# Patient Record
Sex: Female | Born: 1995 | Race: Black or African American | Hispanic: No | Marital: Single | Smoking: Never smoker
Health system: Southern US, Community
[De-identification: ages and names within clinical notes are randomized; demographics above are authoritative.]

## PROBLEM LIST (undated history)

## (undated) ENCOUNTER — Inpatient Hospital Stay (HOSPITAL_COMMUNITY): Payer: Self-pay

## (undated) ENCOUNTER — Emergency Department (HOSPITAL_COMMUNITY): Discharge status: Absent without leave | Payer: Self-pay

## (undated) DIAGNOSIS — K08409 Partial loss of teeth, unspecified cause, unspecified class: Secondary | ICD-10-CM

## (undated) DIAGNOSIS — Z86718 Personal history of other venous thrombosis and embolism: Secondary | ICD-10-CM

## (undated) DIAGNOSIS — A6009 Herpesviral infection of other urogenital tract: Secondary | ICD-10-CM

## (undated) HISTORY — PX: WISDOM TOOTH EXTRACTION: SHX21

## (undated) HISTORY — PX: NO PAST SURGERIES: SHX2092

---
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
## 2013-01-10 ENCOUNTER — Emergency Department (HOSPITAL_COMMUNITY)
Admission: EM | Admit: 2013-01-10 | Discharge: 2013-01-11 | Disposition: A | Discharge status: Home / Self Care | Payer: Medicaid Other | Attending: Emergency Medicine | Admitting: Emergency Medicine

## 2013-01-10 ENCOUNTER — Encounter (HOSPITAL_COMMUNITY): Payer: Self-pay | Admitting: Emergency Medicine

## 2013-01-10 DIAGNOSIS — R110 Nausea: Secondary | ICD-10-CM | POA: Insufficient documentation

## 2013-01-10 DIAGNOSIS — Z86718 Personal history of other venous thrombosis and embolism: Secondary | ICD-10-CM | POA: Insufficient documentation

## 2013-01-10 DIAGNOSIS — R51 Headache: Secondary | ICD-10-CM | POA: Insufficient documentation

## 2013-01-10 DIAGNOSIS — Z8719 Personal history of other diseases of the digestive system: Secondary | ICD-10-CM | POA: Insufficient documentation

## 2013-01-10 DIAGNOSIS — R519 Headache, unspecified: Secondary | ICD-10-CM

## 2013-01-10 MED ORDER — DEXAMETHASONE SODIUM PHOSPHATE 10 MG/ML IJ SOLN
10.0000 mg | Freq: Once | INTRAMUSCULAR | Status: AC
  Administered 2013-01-10: 10 mg via INTRAMUSCULAR

## 2013-01-10 MED ORDER — METOCLOPRAMIDE HCL 5 MG/ML IJ SOLN
10.0000 mg | Freq: Once | INTRAMUSCULAR | Status: AC
  Administered 2013-01-10: 10 mg via INTRAMUSCULAR
  Filled 2013-01-10: qty 2

## 2013-01-10 MED ORDER — DIPHENHYDRAMINE HCL 25 MG PO CAPS
25.0000 mg | ORAL_CAPSULE | Freq: Once | ORAL | Status: AC
  Administered 2013-01-10: 25 mg via ORAL
  Filled 2013-01-10: qty 1

## 2013-01-10 MED ORDER — DEXAMETHASONE SODIUM PHOSPHATE 10 MG/ML IJ SOLN
10.0000 mg | Freq: Once | INTRAMUSCULAR | Status: DC
  Filled 2013-01-10: qty 1
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
## 2013-01-10 NOTE — ED Notes (Signed)
Pt states she was recently treated for chlamydia and states that she is still having an abnormal discharge and abd cramping with some spotting  Pt requests to be checked for STDs while here

## 2013-01-10 NOTE — ED Notes (Signed)
Pt states for the past 2 weeks she has had a headache every day  Pt states she has taken OTC medications, tylenol and aleve without relief  PT states there are spots on her head that feel like they are "thumping" and pt states her ears are ringing

## 2013-01-11 NOTE — ED Provider Notes (Signed)
Medical screening examination/treatment/procedure(s) were performed by non-physician practitioner and as supervising physician I was immediately available for consultation/collaboration.  EKG Interpretation   None         Lyanne Co, MD 01/11/13 [phone]

## 2013-01-16 ENCOUNTER — Encounter: Payer: Self-pay | Admitting: Obstetrics & Gynecology

## 2013-01-16 ENCOUNTER — Ambulatory Visit (INDEPENDENT_AMBULATORY_CARE_PROVIDER_SITE_OTHER): Payer: Medicaid Other | Admitting: Obstetrics & Gynecology

## 2013-01-16 VITALS — BP 119/76 | HR 84 | Temp 97.5°F | Ht 67.0 in | Wt 145.0 lb

## 2013-01-16 DIAGNOSIS — Z0000 Encounter for general adult medical examination without abnormal findings: Secondary | ICD-10-CM

## 2013-01-16 NOTE — Progress Notes (Signed)
Subjective:    01/16/2013 4:50 pm PATIENT NOT SEEN BY PROVIDER TOO EARLY FOR TEST OF CURE. ADVISED TO RESCHEDULE FOR January 2015.   Amy Dougherty is a 17 y.o. female here for one month follow up exam.  Current complaints: pt would like to have STD panel performed, also wants a pap smear, state she has family hx of cervical cancer in great grandmother.  Personal health questionnaire reviewed: yes.   Gynecologic History Patient's last menstrual period was 12/29/2012. Contraception: none Last Pap: N/A Last mammogram: N/A

## 2013-02-05 ENCOUNTER — Emergency Department (HOSPITAL_COMMUNITY)
Admission: EM | Admit: 2013-02-05 | Discharge: 2013-02-05 | Disposition: A | Discharge status: Home / Self Care | Payer: Medicaid Other | Attending: Emergency Medicine | Admitting: Emergency Medicine

## 2013-02-05 ENCOUNTER — Emergency Department (HOSPITAL_COMMUNITY): Payer: Medicaid Other

## 2013-02-05 ENCOUNTER — Encounter (HOSPITAL_COMMUNITY): Payer: Self-pay | Admitting: Emergency Medicine

## 2013-02-05 DIAGNOSIS — Y9389 Activity, other specified: Secondary | ICD-10-CM | POA: Insufficient documentation

## 2013-02-05 DIAGNOSIS — X500XXA Overexertion from strenuous movement or load, initial encounter: Secondary | ICD-10-CM | POA: Insufficient documentation

## 2013-02-05 DIAGNOSIS — M62838 Other muscle spasm: Secondary | ICD-10-CM

## 2013-02-05 DIAGNOSIS — Z23 Encounter for immunization: Secondary | ICD-10-CM | POA: Insufficient documentation

## 2013-02-05 DIAGNOSIS — Y92009 Unspecified place in unspecified non-institutional (private) residence as the place of occurrence of the external cause: Secondary | ICD-10-CM | POA: Insufficient documentation

## 2013-02-05 DIAGNOSIS — Z8719 Personal history of other diseases of the digestive system: Secondary | ICD-10-CM | POA: Insufficient documentation

## 2013-02-05 DIAGNOSIS — R42 Dizziness and giddiness: Secondary | ICD-10-CM | POA: Insufficient documentation

## 2013-02-05 DIAGNOSIS — S46909A Unspecified injury of unspecified muscle, fascia and tendon at shoulder and upper arm level, unspecified arm, initial encounter: Secondary | ICD-10-CM | POA: Insufficient documentation

## 2013-02-05 DIAGNOSIS — S4980XA Other specified injuries of shoulder and upper arm, unspecified arm, initial encounter: Secondary | ICD-10-CM | POA: Insufficient documentation

## 2013-02-05 DIAGNOSIS — R55 Syncope and collapse: Secondary | ICD-10-CM

## 2013-02-05 DIAGNOSIS — S0993XA Unspecified injury of face, initial encounter: Secondary | ICD-10-CM | POA: Insufficient documentation

## 2013-02-05 DIAGNOSIS — Z86718 Personal history of other venous thrombosis and embolism: Secondary | ICD-10-CM | POA: Insufficient documentation

## 2013-02-05 DIAGNOSIS — R0602 Shortness of breath: Secondary | ICD-10-CM

## 2013-02-05 DIAGNOSIS — R296 Repeated falls: Secondary | ICD-10-CM | POA: Insufficient documentation

## 2013-02-05 LAB — POCT I-STAT, CHEM 8
Chloride: 104 mEq/L (ref 96–112)
Creatinine, Ser: 0.7 mg/dL (ref 0.50–1.10)
HCT: 35 % — ABNORMAL LOW (ref 36.0–46.0)
Hemoglobin: 11.9 g/dL — ABNORMAL LOW (ref 12.0–15.0)
Potassium: 3.5 mEq/L — ABNORMAL LOW (ref 3.7–5.3)
Sodium: 141 mEq/L (ref 137–147)
TCO2: 25 mmol/L (ref 0–100)

## 2013-02-05 MED ORDER — IOHEXOL 350 MG/ML SOLN
100.0000 mL | Freq: Once | INTRAVENOUS | Status: AC | PRN
  Administered 2013-02-05: 100 mL via INTRAVENOUS

## 2013-02-05 MED ORDER — TETANUS-DIPHTH-ACELL PERTUSSIS 5-2.5-18.5 LF-MCG/0.5 IM SUSP
0.5000 mL | Freq: Once | INTRAMUSCULAR | Status: AC
  Administered 2013-02-05: 0.5 mL via INTRAMUSCULAR
  Filled 2013-02-05 (×2): qty 0.5

## 2013-02-05 MED ORDER — CYCLOBENZAPRINE HCL 10 MG PO TABS: 10.0000 mg | ORAL_TABLET | Freq: Three times a day (TID) | ORAL | Status: DC | PRN

## 2013-02-05 MED ORDER — IOHEXOL 300 MG/ML  SOLN: 100.0000 mL | Freq: Once | INTRAMUSCULAR | Status: DC | PRN

## 2013-02-05 NOTE — ED Notes (Signed)
Pt states she was on the phone and felt a pain in her left shoulder, then her neck became stiff and she felt like it was hard to breathe, as she was walking through the kitchen she started to fall and injured her left knee

## 2013-02-05 NOTE — Discharge Instructions (Signed)
Your lab tests and CAT scan of your chest have not shown any signs for concerning or emergent cause of your symptoms. There were no signs of blood clots in her lungs. At this time your providers feel that you may return home. Use warm compresses over your shoulder and neck area to help with pain and tightness. Followup with a primary care provider for continued evaluation and treatment.    Muscle Cramps and Spasms Muscle cramps and spasms occur when a muscle or muscles tighten and you have no control over this tightening (involuntary muscle contraction). They are a common problem and can develop in any muscle. The most common place is in the calf muscles of the leg. Both muscle cramps and muscle spasms are involuntary muscle contractions, but they also have differences:   Muscle cramps are sporadic and painful. They may last a few seconds to a quarter of an hour. Muscle cramps are often more forceful and last longer than muscle spasms.  Muscle spasms may or may not be painful. They may also last just a few seconds or much longer. CAUSES  It is uncommon for cramps or spasms to be due to a serious underlying problem. In many cases, the cause of cramps or spasms is unknown. Some common causes are:   Overexertion.   Overuse from repetitive motions (doing the same thing over and over).   Remaining in a certain position for a long period of time.   Improper preparation, form, or technique while performing a sport or activity.   Dehydration.   Injury.   Side effects of some medicines.   Abnormally low levels of the salts and ions in your blood (electrolytes), especially potassium and calcium. This could happen if you are taking water pills (diuretics) or you are pregnant.  Some underlying medical problems can make it more likely to develop cramps or spasms. These include, but are not limited to:   Diabetes.   Parkinson disease.   Hormone disorders, such as thyroid problems.    Alcohol abuse.   Diseases specific to muscles, joints, and bones.   Blood vessel disease where not enough blood is getting to the muscles.  HOME CARE INSTRUCTIONS   Stay well hydrated. Drink enough water and fluids to keep your urine clear or pale yellow.  It may be helpful to massage, stretch, and relax the affected muscle.  For tight or tense muscles, use a warm towel, heating pad, or hot shower water directed to the affected area.  If you are sore or have pain after a cramp or spasm, applying ice to the affected area may relieve discomfort.  Put ice in a plastic bag.  Place a towel between your skin and the bag.  Leave the ice on for 15-20 minutes, 03-04 times a day.  Medicines used to treat a known cause of cramps or spasms may help reduce their frequency or severity. Only take over-the-counter or prescription medicines as directed by your caregiver. SEEK MEDICAL CARE IF:  Your cramps or spasms get more severe, more frequent, or do not improve over time.  MAKE SURE YOU:   Understand these instructions.  Will watch your condition.  Will get help right away if you are not doing well or get worse. Document Released: 07/15/2001 Document Revised: 05/20/2012 Document Reviewed: 01/10/2012 [facility] Patient Information 2014 [place], [place].   Syncope Syncope is a fainting spell. This means the person loses consciousness and drops to the ground. The person is generally unconscious for less than  5 minutes. The person may have some muscle twitches for up to 15 seconds before waking up and returning to normal. Syncope occurs more often in elderly people, but it can happen to anyone. While most causes of syncope are not dangerous, syncope can be a sign of a serious medical problem. It is important to seek medical care.  CAUSES  Syncope is caused by a sudden decrease in blood flow to the brain. The specific cause is often not determined. Factors that can trigger syncope  include:  Taking medicines that lower blood pressure.  Sudden changes in posture, such as standing up suddenly.  Taking more medicine than prescribed.  Standing in one place for too long.  Seizure disorders.  Dehydration and excessive exposure to heat.  Low blood sugar (hypoglycemia).  Straining to have a bowel movement.  Heart disease, irregular heartbeat, or other circulatory problems.  Fear, emotional distress, seeing blood, or severe pain. SYMPTOMS  Right before fainting, you may:  Feel dizzy or lightheaded.  Feel nauseous.  See all white or all black in your field of vision.  Have cold, clammy skin. DIAGNOSIS  Your caregiver will ask about your symptoms, perform a physical exam, and perform electrocardiography (ECG) to record the electrical activity of your heart. Your caregiver may also perform other heart or blood tests to determine the cause of your syncope. TREATMENT  In most cases, no treatment is needed. Depending on the cause of your syncope, your caregiver may recommend changing or stopping some of your medicines. HOME CARE INSTRUCTIONS  Have someone stay with you until you feel stable.  Do not drive, operate machinery, or play sports until your caregiver says it is okay.  Keep all follow-up appointments as directed by your caregiver.  Lie down right away if you start feeling like you might faint. Breathe deeply and steadily. Wait until all the symptoms have passed.  Drink enough fluids to keep your urine clear or pale yellow.  If you are taking blood pressure or heart medicine, get up slowly, taking several minutes to sit and then stand. This can reduce dizziness. SEEK IMMEDIATE MEDICAL CARE IF:   You have a severe headache.  You have unusual pain in the chest, abdomen, or back.  You are bleeding from the mouth or rectum, or you have black or tarry stool.  You have an irregular or very fast heartbeat.  You have pain with breathing.  You have  repeated fainting or seizure-like jerking during an episode.  You faint when sitting or lying down.  You have confusion.  You have difficulty walking.  You have severe weakness.  You have vision problems. If you fainted, call your local emergency services (911 in [place] Do not drive yourself to the hospital.  MAKE SURE YOU:  Understand these instructions.  Will watch your condition.  Will get help right away if you are not doing well or get worse. Document Released: 01/23/2005 Document Revised: 07/25/2011 Document Reviewed: 03/24/2011 [facility] Patient Information 2014 [place], [place].

## 2013-02-05 NOTE — ED Provider Notes (Signed)
CSN: [id number]     Arrival date & time 02/05/13  0137 History   First MD Initiated Contact with Patient 02/05/13 0325     Chief Complaint  Patient presents with  . Shoulder Pain  . Near Syncope   HPI  History provided by the patient. 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 Patient states that she was talking on the telephone was calm and relaxed when she suddenly felt a sharp pain in her left shoulder and neck area. She states that she felt there may have been a lump in the area but could not see anything in the mirror. She went to try to lie down but reports feeling more comfortable laying flat. She went to stand back up and states that she felt a tightness in her neck and throat area with shortness of breath. This was followed by a brief episodes of syncope causing her to fall and scrape her left knee. She did have a small abrasion with some bleeding that was controlled. Currently she denies any shortness of breath. Denies any chest pain. No cough or hemoptysis. She still continues to have some pain and discomfort in her left shoulder and neck area. Denies any neck stiffness or reduced range of motion. Denies any recent illnesses, fever, chills or sweats. No recent long travel. No swelling or pain in the extremities. Patient did recently gave birth 3 months ago. She does not use any estrogen or birth control currently. No other aggravating or alleviating factors. No other associated symptoms.    Past Medical History  Diagnosis Date  . History of blood clots     Blood clot in  Right overy after delivery  . Wisdom teeth extracted    Past Surgical History  Procedure Laterality Date  . Wisdom tooth extraction     Family History  Problem Relation Age of Onset  . Asthma Brother    History  Substance Use Topics  . Smoking  status: Never Smoker   . Smokeless tobacco: Not on file  . Alcohol Use: Yes     Comment: rarely   OB History   Grav Para Term Preterm Abortions TAB SAB Ect Mult Living   2 2 2  0 0 0 0 0 0 2     Review of Systems  Respiratory: Positive for shortness of breath. Negative for cough.   Cardiovascular: Negative for chest pain.  Musculoskeletal: Positive for neck pain. Negative for neck stiffness.  Neurological: Positive for syncope and light-headedness. Negative for weakness, numbness and headaches.  All other systems reviewed and are negative.    Allergies  Apple; Ibuprofen; Norethindrone; and Other  Home Medications   Current Outpatient Rx  Name  Route  Sig  Dispense  Refill  . acetaminophen (TYLENOL) 500 MG tablet   Oral   Take 500 mg by mouth every 6 (six) hours as needed for moderate pain or headache.         . naproxen sodium (ANAPROX) 220 MG tablet   Oral   Take 220 mg by mouth 2 (two) times daily as needed (headache).          BP 129/69  Pulse 75  Temp(Src) 97.9 F (36.6 C) (Oral)  Resp 18  SpO2 99%  LMP 01/29/2013 Physical Exam  Nursing note and vitals reviewed. Constitutional: She is oriented to  person, place, and time. She appears well-developed and well-nourished. No distress.  HENT:  Head: Normocephalic.  Mouth/Throat: Oropharynx is clear and moist.  Neck: Normal range of motion. Neck supple. No tracheal deviation present. No thyromegaly present.  No cervical midline tenderness. Normal swallow.  Cardiovascular: Normal rate and regular rhythm.   Pulmonary/Chest: Effort normal and breath sounds normal. No stridor. No respiratory distress. She has no wheezes. She has no rales.  Abdominal: Soft.  Musculoskeletal: Normal range of motion.  There is some pain around the left trapezius area with slight tightness and spasm of the muscle.  Neurological: She is alert and oriented to person, place, and time. She has normal strength. No sensory deficit.  Skin:  Skin is warm and dry. No rash noted.  Psychiatric: She has a normal mood and affect. Her behavior is normal.    ED Course  Procedures   DIAGNOSTIC STUDIES: Oxygen Saturation is 99% on room air.    COORDINATION OF CARE:  Nursing notes reviewed. Vital signs reviewed. Initial pt interview and examination performed.   4:40 AM-patient seen and evaluated. She appears well no acute distress. Normal respirations O2 sats. Denies shortness of breath this time. 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 She has been on blood thinners in the past but not currently. Recent pregnancy and delivery 3 months ago. Patient with unusual combination of pain in left shoulder and neck area with tightness, difficulty breathing and syncopal episode. Exam without any real significant findings. Discussed work up plan with pt at bedside, which includes basic lab tests and CT scan of chest to rule out PE. Pt agrees with plan.   Labs and CT scan unremarkable. At this time do not suspect any concerning or emergent cause of her symptoms. Patient has not had any return of symptoms with stable vital signs. She was given strict return precautions. She will be discharged at this time.   Treatment plan initiated: Medications  Tdap (BOOSTRIX) injection 0.5 mL (not administered)   Results for orders placed during the hospital encounter of 02/05/13  POCT I-STAT, CHEM 8      Result Value Range   Sodium 141  137 - 147 mEq/L   Potassium 3.5 (*) 3.7 - 5.3 mEq/L   Chloride 104  96 - 112 mEq/L   BUN 15  6 - 23 mg/dL   Creatinine, Ser 1.61  0.50 - 1.10 mg/dL   Glucose, Bld 78  70 - 99 mg/dL   Calcium, Ion 0.96  0.45 - 1.23 mmol/L   TCO2 25  0 - 100 mmol/L   Hemoglobin 11.9 (*) 12.0 - 15.0 g/dL   HCT 40.9 (*) 81.1 - 91.4 %     Imaging Review Ct Angio Chest W/cm &/or Wo Cm  02/05/2013   CLINICAL DATA:  Left shoulder pain and difficulty breathing. History of blood clot on the right ovary. Near syncope.  EXAM: CT  ANGIOGRAPHY CHEST WITH CONTRAST  TECHNIQUE: Multidetector CT imaging of the chest was performed using the standard protocol during bolus administration of intravenous contrast. Multiplanar CT image reconstructions including MIPs were obtained to evaluate the vascular anatomy.  CONTRAST:  OMNIPAQUE IOHEXOL 350 MG/ML SOLN  COMPARISON:  03/16/2010  FINDINGS: Technically adequate study with good opacification of the central and segmental pulmonary arteries. No focal filling defects demonstrated. No evidence of significant pulmonary embolus.  Normal heart size. Normal caliber thoracic aorta without dissection. Increased soft tissue in the anterior mediastinum consistent with thymic tissue. Esophagus is decompressed. No  significant lymphadenopathy in the chest. No pleural effusion. Visualized portions of the upper abdominal organs are grossly unremarkable. The lungs are clear. No significant change since previous study.  Review of the MIP images confirms the above findings.  IMPRESSION: No evidence of significant pulmonary embolus.   Electronically Signed   By: Burman Nieves M.D.   On: 02/05/2013 05:29      MDM   1. Muscle spasm   2. Shortness of breath   3. Syncope        Angus Seller, PA-C 02/05/13 2055

## 2013-02-06 NOTE — ED Provider Notes (Signed)
Medical screening examination/treatment/procedure(s) were performed by non-physician practitioner and as supervising physician I was immediately available for consultation/collaboration.    Elion Hocker, MD 02/06/13 1001 

## 2013-02-10 ENCOUNTER — Emergency Department (INDEPENDENT_AMBULATORY_CARE_PROVIDER_SITE_OTHER)
Admission: EM | Admit: 2013-02-10 | Discharge: 2013-02-10 | Disposition: A | Discharge status: Home / Self Care | Payer: Medicaid Other | Source: Home / Self Care

## 2013-02-10 ENCOUNTER — Encounter (HOSPITAL_COMMUNITY): Payer: Self-pay | Admitting: Emergency Medicine

## 2013-02-10 DIAGNOSIS — I889 Nonspecific lymphadenitis, unspecified: Secondary | ICD-10-CM

## 2013-02-10 MED ORDER — DOXYCYCLINE HYCLATE 100 MG PO CAPS: 100.0000 mg | ORAL_CAPSULE | Freq: Two times a day (BID) | ORAL | Status: DC

## 2013-02-10 NOTE — ED Notes (Signed)
C/o mass on left side of neck  States the mass is getting bigger The mass is only painful when pressure is put on it Denies any drainage or itching.

## 2013-02-10 NOTE — Discharge Instructions (Signed)
This swollen spot on your neck appears to be a lymph node. We will treat you for an infection, please followup with Dr. Redmond Baseman, otolaryngologist, if your symptoms do not resolve within one week. Place warm compresses over the spot on your neck as needed to help the symptoms.  Swollen Lymph Nodes The lymphatic system filters fluid from around cells. It is like a system of blood vessels. These channels carry lymph instead of blood. The lymphatic system is an important part of the immune (disease fighting) system. When people talk about "swollen glands in the neck," they are usually talking about swollen lymph nodes. The lymph nodes are like the little traps for infection. You and your caregiver may be able to feel lymph nodes, especially swollen nodes, in these common areas: the groin (inguinal area), armpits (axilla), and above the clavicle (supraclavicular). You may also feel them in the neck (cervical) and the back of the head just above the hairline (occipital). Swollen glands occur when there is any condition in which the body responds with an allergic type of reaction. For instance, the glands in the neck can become swollen from insect bites or any type of minor infection on the head. These are very noticeable in children with only minor problems. Lymph nodes may also become swollen when there is a tumor or problem with the lymphatic system, such as Hodgkin's disease. TREATMENT   Most swollen glands do not require treatment. They can be observed (watched) for a short period of time, if your caregiver feels it is necessary. Most of the time, observation is not necessary.  Antibiotics (medicines that kill germs) may be prescribed by your caregiver. Your caregiver may prescribe these if he or she feels the swollen glands are due to a bacterial (germ) infection. Antibiotics are not used if the swollen glands are caused by a virus. HOME CARE INSTRUCTIONS   Take medications as directed by your caregiver.  Only take over-the-counter or prescription medicines for pain, discomfort, or fever as directed by your caregiver. SEEK MEDICAL CARE IF:   If you begin to run a temperature greater than 102 F (38.9 C), or as your caregiver suggests. MAKE SURE YOU:   Understand these instructions.  Will watch your condition.  Will get help right away if you are not doing well or get worse. Document Released: 01/13/2002 Document Revised: 04/17/2011 Document Reviewed: 01/23/2005 [facility] Patient Information 2014 [place].

## 2013-02-10 NOTE — ED Provider Notes (Signed)
CSN: [id number]     Arrival date & time 02/10/13  1306 History   None    Chief Complaint  Patient presents with  . Mass   (Consider location/radiation/quality/duration/timing/severity/associated sxs/prior Treatment) HPI Comments: Swollen spot on back order 5 days, getting bigger and more painful. She was told it was a muscle spasm. No other symptoms.  18 year old female with a history of DVT presents complaining of a mass on the left side of her neck. This is been present since Thursday, about 5 days ago. The area is continually more swollen and painful to touch. She went to the emergency department for this today began and she was told it was a muscle spasm. She was treated with Flexeril, this has not been helping at all. She denies fever, chills, NVD, ear pain, sore throat, headache, lightheadedness.   Past Medical History  Diagnosis Date  . History of blood clots     Blood clot in  Right overy after delivery  . Wisdom teeth extracted    Past Surgical History  Procedure Laterality Date  . Wisdom tooth extraction     Family History  Problem Relation Age of Onset  . Asthma Brother    History  Substance Use Topics  . Smoking status: Never Smoker   . Smokeless tobacco: Not on file  . Alcohol Use: Yes     Comment: rarely   OB History   Grav Para Term Preterm Abortions TAB SAB Ect Mult Living   2 2 2  0 0 0 0 0 0 2     Review of Systems  Constitutional: Negative for fever and chills.  Eyes: Negative for visual disturbance.  Respiratory: Negative for cough and shortness of breath.   Cardiovascular: Negative for chest pain, palpitations and leg swelling.  Gastrointestinal: Negative for nausea, vomiting and abdominal pain.  Endocrine: Negative for polydipsia and polyuria.  Genitourinary: Negative for dysuria, urgency and frequency.  Musculoskeletal: Negative for arthralgias and myalgias.  Skin:       See HPI  Neurological: Negative for dizziness, weakness and  light-headedness.    Allergies  Apple; Ibuprofen; Norethindrone; and Other  Home Medications   Current Outpatient Rx  Name  Route  Sig  Dispense  Refill  . acetaminophen (TYLENOL) 500 MG tablet   Oral   Take 500 mg by mouth every 6 (six) hours as needed for moderate pain or headache.         . cyclobenzaprine (FLEXERIL) 10 MG tablet   Oral   Take 1 tablet (10 mg total) by mouth 3 (three) times daily as needed for muscle spasms.   30 tablet   0   . doxycycline (VIBRAMYCIN) 100 MG capsule   Oral   Take 1 capsule (100 mg total) by mouth 2 (two) times daily.   20 capsule   0   . naproxen sodium (ANAPROX) 220 MG tablet   Oral   Take 220 mg by mouth 2 (two) times daily as needed (headache).          BP 128/67  Pulse 83  Temp(Src) 98.3 F (36.8 C) (Oral)  Resp 16  SpO2 100%  LMP 01/29/2013 Physical Exam  Nursing note and vitals reviewed. Constitutional: She is oriented to person, place, and time. Vital signs are normal. She appears well-developed and well-nourished. No distress.  HENT:  Head: Normocephalic and atraumatic.  Neck:    Urine demarcates a firm, tender knot in the neck over top of the superior trapezius, somewhat mobile  Pulmonary/Chest: Effort normal. No respiratory distress.  Neurological: She is alert and oriented to person, place, and time. She has normal strength. Coordination normal.  Skin: Skin is warm and dry. No rash noted. She is not diaphoretic.  Psychiatric: She has a normal mood and affect. Judgment normal.    ED Course  Procedures (including critical care time) Labs Review Labs Reviewed - No data to display Imaging Review No results found.    MDM   1. Lymphadenitis     Patient seen with Dr. Jake Michaelis, this is probably lymphadenitis. Treat with doxycycline followup with ENT for excisional biopsy if not improving.  Pt agrees with plan.  ED if worsening    Meds ordered this encounter  Medications  . doxycycline (VIBRAMYCIN) 100  MG capsule    Sig: Take 1 capsule (100 mg total) by mouth 2 (two) times daily.    Dispense:  20 capsule    Refill:  0    Order Specific Question:  Supervising Provider    Answer:  Jake Michaelis, DAVID C [[id number]]     Liam Graham, PA-C 02/11/13 1708

## 2013-02-12 NOTE — ED Provider Notes (Signed)
Medical screening examination/treatment/procedure(s) were performed by non-physician practitioner and as supervising physician I was immediately available for consultation/collaboration.  Philipp Deputy, M.D.  Harden Mo, MD 02/12/13 [id number]

## 2013-02-17 ENCOUNTER — Encounter: Payer: Self-pay | Admitting: Home Health Services

## 2013-02-19 ENCOUNTER — Encounter: Payer: Self-pay | Admitting: Obstetrics

## 2013-02-19 ENCOUNTER — Ambulatory Visit (INDEPENDENT_AMBULATORY_CARE_PROVIDER_SITE_OTHER): Payer: Medicaid Other | Admitting: Obstetrics

## 2013-02-19 VITALS — BP 128/82 | HR 77 | Temp 97.1°F | Ht 66.0 in | Wt 152.0 lb

## 2013-02-19 DIAGNOSIS — Z8619 Personal history of other infectious and parasitic diseases: Secondary | ICD-10-CM

## 2013-02-19 DIAGNOSIS — N72 Inflammatory disease of cervix uteri: Secondary | ICD-10-CM

## 2013-02-19 DIAGNOSIS — Z113 Encounter for screening for infections with a predominantly sexual mode of transmission: Secondary | ICD-10-CM

## 2013-02-19 NOTE — Progress Notes (Signed)
Subjective:    Amy Dougherty is a 18 y.o. female who presents for sexually transmitted disease check. Sexual history reviewed with the patient. STI Exposure: denies knowledge of risky exposure. Previous history of STI chlamydia. Current symptoms pelvic pain: mild. Patient in office today for test of cure. Patienthad positive chlamydia cultures 12-12-12. Patient completed treatment an denies any re-exposure. Contraception: condoms Menstrual History: OB History   Grav Para Term Preterm Abortions TAB SAB Ect Mult Living   2 2 2  0 0 0 0 0 0 2      Menarche age: 49  Patient's last menstrual period was 01/29/2013.    The following portions of the patient's history were reviewed and updated as appropriate: allergies, current medications, past family history, past medical history, past social history, past surgical history and problem list.  Review of Systems Pertinent items are noted in HPI.    Objective:    BP 128/82  Pulse 77  Temp(Src) 97.1 F (36.2 C)  Ht 5\' 6"  (1.676 m)  Wt 152 lb (68.947 kg)  BMI 24.55 kg/m2  LMP 01/29/2013  Breastfeeding? No General:   alert and no distress  Lymph Nodes:   Cervical, supraclavicular, and axillary nodes normal.  Pelvis:  Vulva and vagina appear normal. Bimanual exam reveals normal uterus and adnexa.  Cultures:  GC and Chlamydia genprobes and Affirm     Assessment:    Possible STD exposure   F/U from previous STD exposure.   Plan:    Discussed safe sexual practice in detail See orders for STD cultures and assays Will call pt with results

## 2013-02-20 ENCOUNTER — Encounter: Payer: Self-pay | Admitting: Obstetrics

## 2013-02-20 LAB — HIV ANTIBODY (ROUTINE TESTING W REFLEX): HIV: NONREACTIVE

## 2013-02-20 LAB — WET PREP BY MOLECULAR PROBE
Candida species: NEGATIVE
Gardnerella vaginalis: NEGATIVE
Trichomonas vaginosis: NEGATIVE

## 2013-02-20 LAB — GC/CHLAMYDIA PROBE AMP
CT Probe RNA: NEGATIVE
GC PROBE AMP APTIMA: NEGATIVE

## 2013-02-20 LAB — HEPATITIS B SURFACE ANTIGEN: Hepatitis B Surface Ag: NEGATIVE

## 2013-02-20 LAB — RPR

## 2013-02-20 LAB — HEPATITIS C ANTIBODY: HCV Ab: NEGATIVE

## 2013-04-28 ENCOUNTER — Other Ambulatory Visit: Payer: Self-pay | Admitting: Dermatology

## 2013-04-29 ENCOUNTER — Other Ambulatory Visit: Payer: Self-pay | Admitting: *Deleted

## 2013-05-01 ENCOUNTER — Other Ambulatory Visit: Payer: Self-pay | Admitting: *Deleted

## 2013-05-01 DIAGNOSIS — Z309 Encounter for contraceptive management, unspecified: Secondary | ICD-10-CM

## 2013-05-01 MED ORDER — NORETHINDRONE 0.35 MG PO TABS: 1.0000 | ORAL_TABLET | Freq: Every day | ORAL | Status: DC

## 2013-05-01 MED ORDER — NORETHINDRONE 0.35 MG PO TABS
1.0000 | ORAL_TABLET | Freq: Every day | ORAL | Status: DC
Start: 2013-05-01 — End: 2013-05-01

## 2013-05-02 ENCOUNTER — Encounter: Payer: Self-pay | Admitting: Obstetrics & Gynecology

## 2013-05-08 ENCOUNTER — Other Ambulatory Visit: Payer: Self-pay | Admitting: *Deleted

## 2013-05-08 DIAGNOSIS — B9689 Other specified bacterial agents as the cause of diseases classified elsewhere: Secondary | ICD-10-CM

## 2013-05-08 DIAGNOSIS — N760 Acute vaginitis: Principal | ICD-10-CM

## 2013-05-08 MED ORDER — METRONIDAZOLE 500 MG PO TABS: 500.0000 mg | ORAL_TABLET | Freq: Two times a day (BID) | ORAL | Status: DC

## 2013-05-16 ENCOUNTER — Encounter (HOSPITAL_COMMUNITY): Payer: Self-pay | Admitting: *Deleted

## 2013-05-16 ENCOUNTER — Inpatient Hospital Stay (HOSPITAL_COMMUNITY)
Admission: AD | Admit: 2013-05-16 | Discharge: 2013-05-23 | DRG: 758 | Disposition: A | Discharge status: Home / Self Care | Payer: Medicaid Other | Source: Ambulatory Visit | Attending: Obstetrics | Admitting: Obstetrics

## 2013-05-16 DIAGNOSIS — A549 Gonococcal infection, unspecified: Secondary | ICD-10-CM | POA: Diagnosis present

## 2013-05-16 DIAGNOSIS — A6000 Herpesviral infection of urogenital system, unspecified: Secondary | ICD-10-CM

## 2013-05-16 DIAGNOSIS — D234 Other benign neoplasm of skin of scalp and neck: Secondary | ICD-10-CM | POA: Diagnosis present

## 2013-05-16 DIAGNOSIS — A5609 Other chlamydial infection of lower genitourinary tract: Secondary | ICD-10-CM

## 2013-05-16 DIAGNOSIS — A5602 Chlamydial vulvovaginitis: Secondary | ICD-10-CM | POA: Diagnosis present

## 2013-05-16 DIAGNOSIS — B3749 Other urogenital candidiasis: Secondary | ICD-10-CM

## 2013-05-16 DIAGNOSIS — A5619 Other chlamydial genitourinary infection: Secondary | ICD-10-CM | POA: Diagnosis present

## 2013-05-16 DIAGNOSIS — A5400 Gonococcal infection of lower genitourinary tract, unspecified: Secondary | ICD-10-CM | POA: Diagnosis present

## 2013-05-16 DIAGNOSIS — D649 Anemia, unspecified: Secondary | ICD-10-CM | POA: Diagnosis present

## 2013-05-16 DIAGNOSIS — R609 Edema, unspecified: Secondary | ICD-10-CM | POA: Diagnosis present

## 2013-05-16 DIAGNOSIS — N739 Female pelvic inflammatory disease, unspecified: Secondary | ICD-10-CM | POA: Diagnosis present

## 2013-05-16 DIAGNOSIS — Z872 Personal history of diseases of the skin and subcutaneous tissue: Secondary | ICD-10-CM

## 2013-05-16 DIAGNOSIS — B009 Herpesviral infection, unspecified: Secondary | ICD-10-CM | POA: Diagnosis present

## 2013-05-16 DIAGNOSIS — B081 Molluscum contagiosum: Secondary | ICD-10-CM | POA: Diagnosis present

## 2013-05-16 DIAGNOSIS — B002 Herpesviral gingivostomatitis and pharyngotonsillitis: Secondary | ICD-10-CM

## 2013-05-16 LAB — URINALYSIS, ROUTINE W REFLEX MICROSCOPIC
BILIRUBIN URINE: NEGATIVE
Glucose, UA: NEGATIVE mg/dL
Hgb urine dipstick: NEGATIVE
KETONES UR: NEGATIVE mg/dL
Leukocytes, UA: NEGATIVE
Nitrite: NEGATIVE
PH: 6 (ref 5.0–8.0)
Protein, ur: NEGATIVE mg/dL
Specific Gravity, Urine: 1.02 (ref 1.005–1.030)
UROBILINOGEN UA: 0.2 mg/dL (ref 0.0–1.0)

## 2013-05-16 LAB — CBC WITH DIFFERENTIAL/PLATELET
BASOS PCT: 0 % (ref 0–1)
Basophils Absolute: 0 10*3/uL (ref 0.0–0.1)
EOS ABS: 0.3 10*3/uL (ref 0.0–0.7)
Eosinophils Relative: 4 % (ref 0–5)
HCT: 33.3 % — ABNORMAL LOW (ref 36.0–46.0)
HEMOGLOBIN: 10.7 g/dL — AB (ref 12.0–15.0)
Lymphocytes Relative: 22 % (ref 12–46)
Lymphs Abs: 1.7 10*3/uL (ref 0.7–4.0)
MCH: 26.1 pg (ref 26.0–34.0)
MCHC: 32.1 g/dL (ref 30.0–36.0)
MCV: 81.2 fL (ref 78.0–100.0)
MONOS PCT: 10 % (ref 3–12)
Monocytes Absolute: 0.8 10*3/uL (ref 0.1–1.0)
Neutro Abs: 5 10*3/uL (ref 1.7–7.7)
Neutrophils Relative %: 64 % (ref 43–77)
Platelets: 247 10*3/uL (ref 150–400)
RBC: 4.1 MIL/uL (ref 3.87–5.11)
RDW: 13.7 % (ref 11.5–15.5)
WBC: 7.8 10*3/uL (ref 4.0–10.5)

## 2013-05-16 LAB — BASIC METABOLIC PANEL
BUN: 10 mg/dL (ref 6–23)
CALCIUM: 8.9 mg/dL (ref 8.4–10.5)
CO2: 26 mEq/L (ref 19–32)
CREATININE: 0.51 mg/dL (ref 0.50–1.10)
Chloride: 100 mEq/L (ref 96–112)
Glucose, Bld: 85 mg/dL (ref 70–99)
Potassium: 3.9 mEq/L (ref 3.7–5.3)
Sodium: 138 mEq/L (ref 137–147)

## 2013-05-16 LAB — WET PREP, GENITAL
Clue Cells Wet Prep HPF POC: NONE SEEN
Trich, Wet Prep: NONE SEEN
Yeast Wet Prep HPF POC: NONE SEEN

## 2013-05-16 MED ORDER — MAGIC MOUTHWASH
5.0000 mL | Freq: Four times a day (QID) | ORAL | Status: DC | PRN
  Administered 2013-05-16 – 2013-05-17 (×2): 5 mL via ORAL
  Filled 2013-05-16 (×3): qty 5

## 2013-05-16 MED ORDER — DOCUSATE SODIUM 100 MG PO CAPS
100.0000 mg | ORAL_CAPSULE | Freq: Two times a day (BID) | ORAL | Status: DC
  Administered 2013-05-16 – 2013-05-23 (×15): 100 mg via ORAL
  Filled 2013-05-16 (×18): qty 1

## 2013-05-16 MED ORDER — ONDANSETRON HCL 4 MG/2ML IJ SOLN
4.0000 mg | Freq: Once | INTRAMUSCULAR | Status: AC
Start: 2013-05-16 — End: 2013-05-16
  Administered 2013-05-16: 4 mg via INTRAVENOUS
  Filled 2013-05-16: qty 2

## 2013-05-16 MED ORDER — DEXTROSE 5 % IV SOLN
10.0000 mg/kg | Freq: Three times a day (TID) | INTRAVENOUS | Status: DC
  Administered 2013-05-16 – 2013-05-22 (×18): 595 mg via INTRAVENOUS
  Filled 2013-05-16 (×21): qty 11.9

## 2013-05-16 MED ORDER — SODIUM CHLORIDE 0.9 % IV SOLN
250.0000 mL | INTRAVENOUS | Status: DC | PRN
  Administered 2013-05-16: 10 mL via INTRAVENOUS

## 2013-05-16 MED ORDER — AZITHROMYCIN 1 G PO PACK
1.0000 g | PACK | Freq: Once | ORAL | Status: AC
  Administered 2013-05-16: 1 g via ORAL
  Filled 2013-05-16: qty 1

## 2013-05-16 MED ORDER — PRENATAL MULTIVITAMIN CH
1.0000 | ORAL_TABLET | Freq: Every day | ORAL | Status: DC
  Administered 2013-05-17 – 2013-05-23 (×5): 1 via ORAL
  Filled 2013-05-16 (×9): qty 1

## 2013-05-16 MED ORDER — LACTATED RINGERS IV SOLN
INTRAVENOUS | Status: DC
  Administered 2013-05-16 – 2013-05-22 (×9): via INTRAVENOUS

## 2013-05-16 MED ORDER — SODIUM CHLORIDE 0.9 % IJ SOLN: 3.0000 mL | Freq: Two times a day (BID) | INTRAMUSCULAR | Status: DC

## 2013-05-16 MED ORDER — DEXTROSE 5 % IV SOLN
1.0000 g | Freq: Once | INTRAVENOUS | Status: AC
  Administered 2013-05-16: 1 g via INTRAVENOUS
  Filled 2013-05-16: qty 10

## 2013-05-16 MED ORDER — ONDANSETRON HCL 4 MG/2ML IJ SOLN
4.0000 mg | Freq: Four times a day (QID) | INTRAMUSCULAR | Status: DC | PRN
  Administered 2013-05-18 – 2013-05-19 (×2): 4 mg via INTRAVENOUS
  Filled 2013-05-16 (×2): qty 2

## 2013-05-16 MED ORDER — ONDANSETRON HCL 4 MG PO TABS: 4.0000 mg | ORAL_TABLET | Freq: Four times a day (QID) | ORAL | Status: DC | PRN

## 2013-05-16 MED ORDER — OXYCODONE-ACETAMINOPHEN 5-325 MG PO TABS
1.0000 | ORAL_TABLET | ORAL | Status: DC | PRN
  Administered 2013-05-16 (×2): 1 via ORAL
  Administered 2013-05-17 – 2013-05-19 (×6): 2 via ORAL
  Administered 2013-05-20 – 2013-05-22 (×5): 1 via ORAL
  Filled 2013-05-16: qty 2
  Filled 2013-05-16 (×3): qty 1
  Filled 2013-05-16 (×3): qty 2
  Filled 2013-05-16: qty 1
  Filled 2013-05-16: qty 2
  Filled 2013-05-16: qty 1
  Filled 2013-05-16 (×2): qty 2
  Filled 2013-05-16 (×2): qty 1

## 2013-05-16 MED ORDER — HYDROMORPHONE HCL PF 1 MG/ML IJ SOLN
1.0000 mg | Freq: Once | INTRAMUSCULAR | Status: AC
  Administered 2013-05-16: 1 mg via INTRAVENOUS
  Filled 2013-05-16: qty 1

## 2013-05-16 MED ORDER — SODIUM CHLORIDE 0.9 % IJ SOLN: 3.0000 mL | INTRAMUSCULAR | Status: DC | PRN

## 2013-05-16 MED ORDER — LIDOCAINE HCL 2 % EX GEL
Freq: Once | CUTANEOUS | Status: AC
  Administered 2013-05-16: 13:00:00 via TOPICAL
  Filled 2013-05-16: qty 5

## 2013-05-16 MED ORDER — MAGIC MOUTHWASH W/LIDOCAINE
5.0000 mL | Freq: Four times a day (QID) | ORAL | Status: DC | PRN
  Filled 2013-05-16: qty 5

## 2013-05-16 NOTE — Progress Notes (Addendum)
ANTIVIRAL CONSULT NOTE - INITIAL  Pharmacy Consult for Acyclovir Indication: HSV  Allergies  Allergen Reactions  . Apple Swelling  . Ibuprofen Other (See Comments)    Blood thinner medications  . Norethindrone Other (See Comments)    headaches  . Other Itching    Tree nuts    Patient Measurements: Height: 5\' 6"  (167.6 cm) Weight: 161 lb (73.029 kg) IBW/kg (Calculated) : 59.3   Vital Signs: Temp: 98.2 F (36.8 C) (04/10 1427) Temp src: Oral (04/10 1427) BP: 141/86 mmHg (04/10 1427) Pulse Rate: 84 (04/10 1427)  Labs: No results found for this basename: WBC, HGB, PLT, LABCREA, CREATININE, CRCLEARANCE,  in the last 72 hours No results found for this basename: GENTTROUGH, GENTPEAK, GENTRANDOM,  in the last 72 hours   Microbiology: Recent Results (from the past 720 hour(s))  WET PREP, GENITAL     Status: Abnormal   Collection Time    05/16/13  1:20 PM      Result Value Ref Range Status   Yeast Wet Prep HPF POC NONE SEEN  NONE SEEN Final   Trich, Wet Prep NONE SEEN  NONE SEEN Final   Clue Cells Wet Prep HPF POC NONE SEEN  NONE SEEN Final   WBC, Wet Prep HPF POC FEW (*) NONE SEEN Final   Comment: MODERATE BACTERIA SEEN    Medications:  Ceftriaxone 250mg  IM x 1 Azithromycin 1000mg  PO x 1  Assessment: 18 y.o. female presents with vaginal pain and swelling.  Ulcers in mouth and on lip.  She has not voided in 24 hours. A foley was placed producing 1064mL residual urine.  Hx chlamydia last year. 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    Plan:  Acyclovir 10mg /kg/dose IV Q 8 hours for 2-7 days followed by oral therapy for a total of at least 10 days of therapy.   Maintain adequate hydration during IV therapy.  BMP/CBC is pending today.  Will check BUN/SCr today again in 72 hours if continues on IV Acyclovir at that time.    Sherman 05/16/2013,2:37 PM

## 2013-05-16 NOTE — MAU Note (Signed)
Patient states she started having vaginal swelling yesterday and is unable to urinate. States when she woke up today her lips are swollen. Denies vaginal discharge or bleeding.

## 2013-05-16 NOTE — MAU Provider Note (Signed)
CSN: [id number]     Arrival date & time 05/16/13  1133 History   None    Chief Complaint  Patient presents with  . Vaginal Pain  . Facial Swelling     (Consider location/radiation/quality/duration/timing/severity/associated sxs/prior Treatment) Patient is a 18 y.o. female presenting with vaginal pain. The history is provided by the patient.  Vaginal Pain This is a new problem. The current episode started yesterday. The problem occurs constantly. The problem has been gradually worsening. Associated symptoms include abdominal pain and a rash. Pertinent negatives include no anorexia, chest pain, chills, congestion, coughing, fever, headaches, joint swelling, myalgias, nausea, sore throat, swollen glands or vomiting.  Vaginal Pain This is a new problem. The current episode started yesterday. The problem occurs constantly. The problem has been gradually worsening. Associated symptoms include abdominal pain and rash. Pertinent negatives include no anorexia, chills, fever, headaches, nausea, sore throat or vomiting.   Amy Dougherty is a 18 y.o. female who presents to MAU with vaginal pain and swelling that started yesterday. She describes the pain as burning, sharp and shooting. This morning she noted swelling and blister area to the face on the lower lip and ulcers inside her mouth. She is a [id number]. She is not pregnant now. She uses no birth control. She uses condoms sometimes. LMP 3/17 and was normal. Current sex partner x 5 years. Hx of Chlamydia last year. Last pap smear one year ago and was normal.    Past Medical History  Diagnosis Date  . History of blood clots     Blood clot in  Right overy after delivery  . Wisdom teeth extracted    Past Surgical History  Procedure Laterality Date  . Wisdom tooth extraction     Family History  Problem Relation Age of Onset  . Asthma Brother    History  Substance Use Topics  . Smoking status: Never Smoker   . Smokeless tobacco: Not on file  .  Alcohol Use: Yes     Comment: rarely   OB History   Grav Para Term Preterm Abortions TAB SAB Ect Mult Living   2 2 2  0 0 0 0 0 0 2     Review of Systems  Constitutional: Negative for fever and chills.  HENT: Positive for mouth sores. Negative for congestion and sore throat.   Eyes: Negative for visual disturbance.  Respiratory: Negative for cough, shortness of breath and wheezing.   Cardiovascular: Negative for chest pain and palpitations.  Gastrointestinal: Positive for abdominal pain. Negative for nausea, vomiting and anorexia.  Genitourinary: Positive for vaginal pain.  Musculoskeletal: Negative for joint swelling and myalgias.  Skin: Positive for rash.  Neurological: Negative for light-headedness and headaches.  Psychiatric/Behavioral: Negative for confusion. The patient is not nervous/anxious.       Allergies  Ace inhibitors; Apple; Ibuprofen; Norethindrone; and Other  Home Medications  No current outpatient prescriptions on file. BP 138/84  Pulse 91  Temp(Src) 98.7 F (37.1 C)  Resp 16  Ht 5\' 6"  (1.676 m)  Wt 161 lb (73.029 kg)  BMI 26.00 kg/m2  SpO2 98%  LMP 04/21/2013  Breastfeeding? No Physical Exam  Nursing note and vitals reviewed. Constitutional: She is oriented to person, place, and time. She appears well-developed and well-nourished. No distress.  HENT:  Mouth/Throat: Oral lesions present.  Multiple oral ulcers. Bottom lip swollen with ulcer areas to the right side of the lower lip. Upper left lip with lesions.  Eyes: Conjunctivae and EOM are normal.  Neck: Neck supple.  Cardiovascular: Normal rate.   Pulmonary/Chest: Effort normal.  Abdominal: Soft. There is tenderness.  Genitourinary:  External genitalia with swelling and multiple vesicular lesions. Tender to touch. Discharge noted from vaginal that is watery yellow.   Musculoskeletal: Normal range of motion.  Lymphadenopathy:    She has cervical adenopathy.  Neurological: She is alert and  oriented to person, place, and time. No cranial nerve deficit.  Skin: Skin is warm and dry.  Psychiatric: She has a normal mood and affect. Her behavior is normal.   ED Course  Procedures  Foley catheter inserted, because of the amount of swelling and pain lidocaine jell was applied prior to the procedure. Two providers to do procedure. Labia held open by one provider and the other provider inserted foley catheter.  Labs drawn, cultures sent for GC, Chlamydia, Wet prep and HSV Discussed with Dr. Jodi Mourning and since patient is unable to void will admit for treatment and pain management.  Admission orders written.  Discussed with the patient and all questioned fully answered. She agrees to plan.  MDM  18 y.o. female with multiple vesicular lesions oral and vaginal.

## 2013-05-16 NOTE — Progress Notes (Signed)
UR completed 

## 2013-05-17 LAB — GC/CHLAMYDIA PROBE AMP
CT Probe RNA: NEGATIVE
GC Probe RNA: POSITIVE — AB

## 2013-05-17 NOTE — Progress Notes (Signed)
*   No surgery found *  Subjective: Patient reports less pain.  Objective: I have reviewed patient's vital signs, intake and output, medications, labs and microbiology.  General: alert and no distress Vaginal Bleeding: none  Assessment: Genital herpes.  Severe.    Plan: Continue supportive management.  LOS: 1 day    Shelly Bombard 05/17/2013, 6:52 AM

## 2013-05-17 NOTE — H&P (Addendum)
Amy Dougherty is an 18 y.o. female. Presents with severe genital herpes outbreak.  Pertinent Gynecological History: Menses: flow is light Bleeding: normal Contraception: none DES exposure: denies Blood transfusions: none Sexually transmitted diseases: chlamydia Previous GYN Procedures: None  Menstrual History: Menarche age: not asked  Patient's last menstrual period was 04/21/2013.    Past Medical History  Diagnosis Date  . History of blood clots     Blood clot in  Right overy after delivery  . Wisdom teeth extracted     Past Surgical History  Procedure Laterality Date  . Wisdom tooth extraction      Family History  Problem Relation Age of Onset  . Asthma Brother     Social History:  reports that she has never smoked. She does not have any smokeless tobacco history on file. She reports that she drinks alcohol. She reports that she does not use illicit drugs.  Allergies:  Allergies  Allergen Reactions  . Apple Swelling  . Ibuprofen Other (See Comments)    Blood thinner medications  . Norethindrone Other (See Comments)    headaches  . Other Itching    Tree nuts    Prescriptions prior to admission  Medication Sig Dispense Refill  . metroNIDAZOLE (FLAGYL) 500 MG tablet Take 1 tablet (500 mg total) by mouth 2 (two) times daily.  14 tablet  0  . norethindrone (MICRONOR,CAMILA,ERRIN) 0.35 MG tablet Take 1 tablet (0.35 mg total) by mouth daily.  1 Package  11    Review of Systems  Genitourinary: Positive for dysuria.  All other systems reviewed and are negative.   Blood pressure 121/61, pulse 74, temperature 98.1 F (36.7 C), temperature source Oral, resp. rate 18, height 5\' 6"  (1.676 m), weight 162 lb 1 oz (73.511 kg), last menstrual period 04/21/2013, SpO2 99.00%, not currently breastfeeding. Physical Exam  Nursing note and vitals reviewed. Constitutional: She is oriented to person, place, and time. She appears well-developed and well-nourished.  HENT:   Head: Normocephalic and atraumatic.  Eyes: Conjunctivae are normal. Pupils are equal, round, and reactive to light.  Neck: Normal range of motion. Neck supple.  Cardiovascular: Normal rate and regular rhythm.   Respiratory: Effort normal and breath sounds normal.  GI: Soft.  Genitourinary:  Labia inflamed and swollen with multiple herpetic lesions.  Neurological: She is alert and oriented to person, place, and time.  Skin: Skin is warm and dry.  Psychiatric: She has a normal mood and affect. Her behavior is normal. Judgment and thought content normal.    Results for orders placed during the hospital encounter of 05/16/13 (from the past 24 hour(s))  WET PREP, GENITAL     Status: Abnormal   Collection Time    05/16/13  1:20 PM      Result Value Ref Range   Yeast Wet Prep HPF POC NONE SEEN  NONE SEEN   Trich, Wet Prep NONE SEEN  NONE SEEN   Clue Cells Wet Prep HPF POC NONE SEEN  NONE SEEN   WBC, Wet Prep HPF POC FEW (*) NONE SEEN  URINALYSIS, ROUTINE W REFLEX MICROSCOPIC     Status: None   Collection Time    05/16/13  1:25 PM      Result Value Ref Range   Color, Urine YELLOW  YELLOW   APPearance CLEAR  CLEAR   Specific Gravity, Urine 1.020  1.005 - 1.030   pH 6.0  5.0 - 8.0   Glucose, UA NEGATIVE  NEGATIVE mg/dL   Hgb  urine dipstick NEGATIVE  NEGATIVE   Bilirubin Urine NEGATIVE  NEGATIVE   Ketones, ur NEGATIVE  NEGATIVE mg/dL   Protein, ur NEGATIVE  NEGATIVE mg/dL   Urobilinogen, UA 0.2  0.0 - 1.0 mg/dL   Nitrite NEGATIVE  NEGATIVE   Leukocytes, UA NEGATIVE  NEGATIVE  CBC WITH DIFFERENTIAL     Status: Abnormal   Collection Time    05/16/13  2:25 PM      Result Value Ref Range   WBC 7.8  4.0 - 10.5 K/uL   RBC 4.10  3.87 - 5.11 MIL/uL   Hemoglobin 10.7 (*) 12.0 - 15.0 g/dL   HCT 33.3 (*) 36.0 - 46.0 %   MCV 81.2  78.0 - 100.0 fL   MCH 26.1  26.0 - 34.0 pg   MCHC 32.1  30.0 - 36.0 g/dL   RDW 13.7  11.5 - 15.5 %   Platelets 247  150 - 400 K/uL   Neutrophils Relative % 64   43 - 77 %   Neutro Abs 5.0  1.7 - 7.7 K/uL   Lymphocytes Relative 22  12 - 46 %   Lymphs Abs 1.7  0.7 - 4.0 K/uL   Monocytes Relative 10  3 - 12 %   Monocytes Absolute 0.8  0.1 - 1.0 K/uL   Eosinophils Relative 4  0 - 5 %   Eosinophils Absolute 0.3  0.0 - 0.7 K/uL   Basophils Relative 0  0 - 1 %   Basophils Absolute 0.0  0.0 - 0.1 K/uL  BASIC METABOLIC PANEL     Status: None   Collection Time    05/16/13  2:25 PM      Result Value Ref Range   Sodium 138  137 - 147 mEq/L   Potassium 3.9  3.7 - 5.3 mEq/L   Chloride 100  96 - 112 mEq/L   CO2 26  19 - 32 mEq/L   Glucose, Bld 85  70 - 99 mg/dL   BUN 10  6 - 23 mg/dL   Creatinine, Ser 0.51  0.50 - 1.10 mg/dL   Calcium 8.9  8.4 - 10.5 mg/dL   GFR calc non Af Amer >90  >90 mL/min   GFR calc Af Amer >90  >90 mL/min    No results found.  Assessment/Plan: Genital Herpes.  Primary and severe presentation.  Admit for treatment.  Shelly Bombard 05/17/2013, 6:39 AM

## 2013-05-18 LAB — HIV ANTIBODY (ROUTINE TESTING W REFLEX): HIV 1&2 Ab, 4th Generation: NONREACTIVE

## 2013-05-18 LAB — RPR

## 2013-05-18 LAB — HEPATITIS B SURFACE ANTIGEN: HEP B S AG: NEGATIVE

## 2013-05-18 MED ORDER — SODIUM CHLORIDE 0.9 % IV SOLN
3.0000 g | Freq: Four times a day (QID) | INTRAVENOUS | Status: DC
  Administered 2013-05-18 – 2013-05-22 (×19): 3 g via INTRAVENOUS
  Filled 2013-05-18 (×21): qty 3

## 2013-05-18 MED ORDER — BENZOCAINE 10 % MT GEL
Freq: Four times a day (QID) | OROMUCOSAL | Status: DC | PRN
  Filled 2013-05-18: qty 9.4

## 2013-05-18 MED ORDER — CEFTRIAXONE SODIUM 250 MG IJ SOLR
250.0000 mg | Freq: Once | INTRAMUSCULAR | Status: AC
  Administered 2013-05-18: 250 mg via INTRAMUSCULAR
  Filled 2013-05-18: qty 250

## 2013-05-18 NOTE — Progress Notes (Signed)
Subjective: Patient reports less pain and swelling  Objective: I have reviewed patient's vital signs, intake and output, medications, labs and microbiology.  General: alert and no distress Pelvic:  Less labial edema.   Assessment/Plan: Primary oral-genital herpes outbreak, severe, with inability to void.  Stable.  Continue IV Valtrex.   LOS: 2 days    Shelly Bombard 05/18/2013, 8:58 AM

## 2013-05-19 LAB — HERPES SIMPLEX VIRUS CULTURE: Culture: NOT DETECTED

## 2013-05-19 NOTE — Progress Notes (Signed)
Ur chart review completed.  

## 2013-05-19 NOTE — Discharge Instructions (Signed)
Herpes Simplex Virus Herpes simplex virus is a viral infection that may infect many different areas of the body, such as the genitalia and mouth. There are two different strains of the virus: herpes simplex virus 1 (HSV-1) and herpes simplex virus 2 (HSV-2). HSV-1 is typically associated with infections of the mouth and lips. HSV-2 is associated with infections of the genitals. However, either strain of the virus may infect any area. HSV may be spread through saliva particles or sexual contact. One unusual form of HSV-1, known as herpes gladiatorum, is passed from skin-to-skin contact, such as in wrestling. SYMPTOMS   Sometimes, no symptoms.  Fever.  Headache.  Muscle aches.  Tingling.  Itching.  Tenderness.  Genital burning feeling.  Genital pain.  Pain with urination.  Pain with sexual intercourse.  Small blisters in the affected areas. RISK FACTORS   Kissing an infected person.  Sharing eating utensils with an infected person.  Unprotected sexual activity.  Multiple sexual partners.  Direct contact sports without protective clothing.  Contact with an exposed herpes sore.  Stress, illness, and cold increase the risk of recurrence. PROGNOSIS  The primary outbreak of an HSV infection usually lasts 2 to 3 weeks. However, it has been known to last up to 6 weeks. After the primary outbreak subsides, the virus goes into a stage known as latency. During this time, there may be no physical symptoms of infection. After a period of time, some event, such as stress, cold, or illness will trigger another outbreak. This cycle of latency and outbreak may continue indefinitely. The outbreaks usually become milder over time. The body cannot rid itself of HSV. RELATED COMPLICATIONS   Recurrence.  Infection in other areas of the body, such as the eye (ocular herpetic infection, keratitis) and rarely the brain (herpetic encephalitis). TREATMENT  Many HSV infections can be treated  without medicine. During an outbreak, avoid touching the sores. Ice may be used to dull the pain and suppress the virus. Exposure to the sun is a common trigger for an outbreak, so the use of sunscreen may help in such cases. Avoid sexual contact during outbreaks. During the latent periods, it is advised that you use latex condoms, which will reduce the likelihood of spreading the virus to another person. Condoms made from animal products do not protect against HSV. Female condoms cover a larger area than female condoms, and may offer the most protection from the transmission of HSV. The presence of HSV will not affect a condom's ability to protect against pregnancy. Only take medicines for pain and discomfort if directed to do so by your caregiver. Many claims exist that certain dietary changes will prevent an outbreak, but these claims have not been proven. These claims include eating foods that are high in L-lysine and low in arginine (i.e. yogurt, beets, apples, pears, mangoes, oily fish (such as salmon, haddock, snapper, and swordfish), soybean sprouts, chicken, and tomatoes).  Athletes may return to play once they are showing no symptoms, and they have been treated.  Document Released: 01/23/2005 Document Revised: 04/17/2011 Document Reviewed: 05/07/2008 [facility] Patient Information 2014 [place], [place].  Genital Herpes Genital herpes is a sexually transmitted disease. This means that it is a disease passed by having sex with an infected person. There is no cure for genital herpes. The time between attacks can be months to years. The virus may live in a person but produce no problems (symptoms). This infection can be passed to a baby as it travels down the birth  canal (vagina). In a newborn, this can cause central nervous system damage, eye damage, or even death. The virus that causes genital herpes is usually HSV-2 virus. The virus that causes oral herpes is usually HSV-1. The diagnosis (learning what is  wrong) is made through culture results. SYMPTOMS  Usually symptoms of pain and itching begin a few days to a week after contact. It first appears as small blisters that progress to small painful ulcers which then scab over and heal after several days. It affects the outer genitalia, birth canal, cervix, penis, anal area, buttocks, and thighs. HOME CARE INSTRUCTIONS   Keep ulcerated areas dry and clean.  Take medications as directed. Antiviral medications can speed up healing. They will not prevent recurrences or cure this infection. These medications can also be taken for suppression if there are frequent recurrences.  While the infection is active, it is contagious. Avoid all sexual contact during active infections.  Condoms may help prevent spread of the herpes virus.  Practice safe sex.  Wash your hands thoroughly after touching the genital area.  Avoid touching your eyes after touching your genital area.  Inform your caregiver if you have had genital herpes and become pregnant. It is your responsibility to insure a safe outcome for your baby in this pregnancy.  Only take over-the-counter or prescription medicines for pain, discomfort, or fever as directed by your caregiver. SEEK MEDICAL CARE IF:   You have a recurrence of this infection.  You do not respond to medications and are not improving.  You have new sources of pain or discharge which have changed from the original infection.  You have an oral temperature above 102 F (38.9 C).  You develop abdominal pain.  You develop eye pain or signs of eye infection. Document Released: 01/21/2000 Document Revised: 04/17/2011 Document Reviewed: 02/10/2009 [facility] Patient Information 2014 [place], [place].

## 2013-05-19 NOTE — Progress Notes (Signed)
Subjective: Patient reports less pain and swelling.  Objective: I have reviewed patient's vital signs, intake and output, medications, labs and microbiology.  General: alert and no distress HEENT:  Oral ulcerations less inflamed. Pelvic:  Less labial edema.  Still tender.   Assessment/Plan: Severe primary herpes outbreak.  Herpes culture pending.  Continue supportive management.  LOS: 3 days    Shelly Bombard 05/19/2013, 8:17 AM

## 2013-05-19 NOTE — Progress Notes (Signed)
Patient complained of short of breath "after nausea med.' given at 0100 Zofran 4 mgs IV.  Lungs clear, vital signs WNL and recorded, oriented x4 and not it distress.  Will continue to observe.

## 2013-05-20 ENCOUNTER — Encounter: Payer: Self-pay | Admitting: Obstetrics & Gynecology

## 2013-05-20 NOTE — Progress Notes (Signed)
Subjective: Patient reports no new complaints.  Swelling and pain unchanged.  Objective: I have reviewed patient's vital signs, intake and output, medications, labs and microbiology.  General: alert and no distress HEENT:  Oral ulcerations clean and crusting on lips. Pelvic:  Labial ulcerations clean.  Less labial swelling.   Assessment/Plan: Primary oral - genital herpes outbreak, severe.  Slow improvement.  Continue current therapy.    LOS: 4 days    Shelly Bombard 05/20/2013, 8:09 AM

## 2013-05-20 NOTE — Progress Notes (Signed)
ANTIVIRAL CONSULT NOTE - FOLLOW UP  Pharmacy Consult for acyclovir Indication: HSV  Allergies  Allergen Reactions  . Apple Swelling  . Ibuprofen Other (See Comments)    Blood thinner medications  . Norethindrone Other (See Comments)    headaches  . Other Itching    Tree nuts   Vital Signs: Temp: 98.4 F (36.9 C) (04/14 1200) Temp src: Oral (04/14 1200) BP: 122/63 mmHg (04/14 1200) Pulse Rate: 70 (04/14 1200)  Intake/Output from previous day: 04/13 0701 - 04/14 0700 In: -  Out: 2800 [Urine:2800] Intake/Output from this shift: Total I/O In: 440 [P.O.:240; IV Piggyback:200] Out: 1600 [Urine:1600]  Microbiology:   Anti-infectives   Start     Dose/Rate Route Frequency Ordered Stop   05/18/13 1000  cefTRIAXone (ROCEPHIN) injection 250 mg     250 mg Intramuscular  Once 05/18/13 0929 05/18/13 1520   05/18/13 0300  Ampicillin-Sulbactam (UNASYN) 3 g in sodium chloride 0.9 % 100 mL IVPB     3 g 100 mL/hr over 60 Minutes Intravenous Every 6 hours 05/18/13 0249     05/16/13 1500  acyclovir (ZOVIRAX) 595 mg in dextrose 5 % 100 mL IVPB     10 mg/kg  59.3 kg (Ideal) 111.9 mL/hr over 60 Minutes Intravenous 3 times per day 05/16/13 1434     05/16/13 1345  azithromycin (ZITHROMAX) powder 1 g     1 g Oral  Once 05/16/13 1341 05/16/13 1754   05/16/13 1345  cefTRIAXone (ROCEPHIN) 1 g in dextrose 5 % 50 mL IVPB     1 g 100 mL/hr over 30 Minutes Intravenous  Once 05/16/13 1341 05/16/13 1549      Assessment: 18 yo female on day 5 of acyclovir for primary oral/genital herpes infection and day 3 of Unasyn for bacterial coinfection. Pt still with significant oral swelling and pain resulting in difficulty eating. UOP good. Plan is to continue IV antibiotics another 24 hours and then reevaluate clinical condition for possible oral conversion.   Appropriate dose would be Acyclovir 400 mg PO q8h (or Valtrex 1g PO q12h) for a total of 10 days of IV + PO  Plan:  Continue acyclovir 595 mg IV  q8h, converting to PO as soon as clinically appropriate.  Will continue to monitor   Amy Dougherty 05/20/2013,2:32 PM

## 2013-05-21 NOTE — Progress Notes (Signed)
Subjective: Patient reports less pain and swelling of mouth and labia.   Objective: I have reviewed patient's vital signs, intake and output, medications, labs and microbiology.  General: alert and no distress HEENT:  Oral ulcerations crusting over, dry. Pelvic:  Labia swollen around foley, tender to palpation.   Assessment/Plan: Primary oral-genital herpes outbreak, severe.  Herpes culture negative.  Continue IV Acyclovir.  Will consult ID.   LOS: 5 days    Shelly Bombard 05/21/2013, 2:03 PM

## 2013-05-22 DIAGNOSIS — B3749 Other urogenital candidiasis: Secondary | ICD-10-CM | POA: Diagnosis present

## 2013-05-22 DIAGNOSIS — A549 Gonococcal infection, unspecified: Secondary | ICD-10-CM | POA: Diagnosis present

## 2013-05-22 DIAGNOSIS — Z872 Personal history of diseases of the skin and subcutaneous tissue: Secondary | ICD-10-CM

## 2013-05-22 DIAGNOSIS — A6000 Herpesviral infection of urogenital system, unspecified: Principal | ICD-10-CM

## 2013-05-22 DIAGNOSIS — B009 Herpesviral infection, unspecified: Secondary | ICD-10-CM | POA: Diagnosis present

## 2013-05-22 DIAGNOSIS — A5400 Gonococcal infection of lower genitourinary tract, unspecified: Secondary | ICD-10-CM

## 2013-05-22 DIAGNOSIS — B002 Herpesviral gingivostomatitis and pharyngotonsillitis: Secondary | ICD-10-CM | POA: Diagnosis present

## 2013-05-22 DIAGNOSIS — D234 Other benign neoplasm of skin of scalp and neck: Secondary | ICD-10-CM | POA: Diagnosis present

## 2013-05-22 MED ORDER — FLUCONAZOLE 100 MG PO TABS
100.0000 mg | ORAL_TABLET | Freq: Every day | ORAL | Status: DC
  Administered 2013-05-22 – 2013-05-23 (×2): 100 mg via ORAL
  Filled 2013-05-22 (×3): qty 1

## 2013-05-22 MED ORDER — DEXTROSE 5 % IV SOLN
1.0000 g | Freq: Once | INTRAVENOUS | Status: AC
  Administered 2013-05-22: 1 g via INTRAVENOUS
  Filled 2013-05-22: qty 10

## 2013-05-22 MED ORDER — VALACYCLOVIR HCL 500 MG PO TABS
1000.0000 mg | ORAL_TABLET | Freq: Two times a day (BID) | ORAL | Status: DC
  Administered 2013-05-22 – 2013-05-23 (×2): 1000 mg via ORAL
  Filled 2013-05-22 (×4): qty 2

## 2013-05-22 NOTE — Progress Notes (Signed)
Subjective: Patient reports continued swelling and pain.  Objective: I have reviewed patient's vital signs, intake and output, medications, labs and microbiology.  General: alert and no distress HEENT:  Oral ulcers healing. Pelvic:  Continued labia swelling and tenderness.   Assessment/Plan: Primary oral-genital herpes.  Slow improvement.  Continue IV Acyclovir.   LOS: 6 days    Shelly Bombard 05/22/2013, 10:05 AM

## 2013-05-22 NOTE — Progress Notes (Signed)
UR completed 

## 2013-05-22 NOTE — Progress Notes (Signed)
Patient ID: Amy Dougherty, female   DOB: 1995/04/22, 18 y.o.   MRN: [id number]         [facility]    Date of Admission:  05/16/2013           Day 6 acyclovir        Day 6 ampicillin sulbactam       Reason for Consult: Probable first episode of oral genital herpes simplex    Referring Physician: Dr. Baltazar Najjar  Principal Problem:   Oral herpes simplex infection Active Problems:   Genital herpes   Gonorrhea   Chlamydia vaginitis/cervicitis   Candidiasis of perineum   Normocytic anemia   Molluscum contagiosum infection   Dermatofibroma of neck   History of eczema   . acyclovir  10 mg/kg (Ideal) Intravenous 3 times per day  . ampicillin-sulbactam (UNASYN) IV  3 g Intravenous Q6H  . docusate sodium  100 mg Oral BID  . prenatal multivitamin  1 tablet Oral Q1200    Recommendations: 1. Change acyclovir to valacyclovir 1 g twice daily for at least 3 more days 2. Change ampicillin sulbactam to single dose of ceftriaxone 3. Fluconazole 100 mg daily 4. Check HIV viral load 5. Discontinue contact precautions 6. Consider trial of Foley catheter removal soon 7. Safer sexual practices counseling provided  Assessment: Her oral and vaginal ulcers are most compatible with severe first episode herpes simplex infection. Swab cultures can often be falsely negative due to sampling error. She seems to be improving slowly with time and IV acyclovir. She is now able to eat and drink so I will switch her to oral valacyclovir and treat for at least 3 more days.  I would also recommend changing the ampicillin sulbactam to single dose ceftriaxone for reliable activity against gonococcus.  We'll start her on fluconazole for probable perineal candidiasis.   I will check an HIV viral load to make certain that she does not have primary HIV infection. She is clearly at high risk for all sexually transmitted diseases. I have talked to her about the importance of careful  partner selection and condom use in the future.  I will follow up tomorrow.   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 She was started on empiric therapy with IV acyclovir for probable first episode herpes simplex. Her GC probe was also positive. She was given one dose of azithromycin in the emergency department and started on ampicillin sulbactam. Swab cultures were obtained for herpes simplex and were negative. Her Chlamydia test on admission was negative. RPR and HIV antibody tests are negative. She was unable to void on admission and a Foley catheter was placed.  She has a history of chlamydia several years ago she believes she acquired from the husband of her sons. She had recurrent Chlamydia last November from her boyfriend at that time. She started dating a different boyfriend in February of this year. She states she has not been sexually active for about 6 weeks. She recently noticed a knot on her left neck. She states her primary care physician (she does not know her physician's name) did some blood tests that came back negative. She was referred to Dr. Nena Polio who did an excisional biopsy and found a benign dermatofibroma. She states that she was recently treated with metronidazole for possible bacterial vaginosis. She also  recently obtained some sort of vaginal cream from an Internet source. When I asked her what the medication was for she says that it was kind of embarrassing but that she wanted to try to "fix things down there" after having had 2 children.  She states that she was unable to eat or drink much on admission because of severe oral pain. She states her mouth and lip ulcers are better and she is able to sleep now. She does not feel like her vaginal swelling and pain have improved much. She was also noted to a rash on her buttocks.   Review of Systems: Pertinent  items are noted in HPI.  Past Medical History  Diagnosis Date  . History of blood clots     Blood clot in  Right overy after delivery  . Wisdom teeth extracted     History  Substance Use Topics  . Smoking status: Never Smoker   . Smokeless tobacco: Not on file  . Alcohol Use: Yes     Comment: rarely    Family History  Problem Relation Age of Onset  . Asthma Brother    Allergies  Allergen Reactions  . Apple Swelling  . Ibuprofen Other (See Comments)    Blood thinner medications  . Norethindrone Other (See Comments)    headaches  . Other Itching    Tree nuts    OBJECTIVE: Blood pressure 139/88, pulse 80, temperature 98 F (36.7 C), temperature source Oral, resp. rate 16, height 5\' 6"  (1.676 m), weight 73.993 kg (163 lb 2 oz), last menstrual period 04/21/2013, SpO2 100.00%, not currently breastfeeding. General: She is alert and in no distress Skin: Healing scar on left side of neck where a biopsy was performed Lymph nodes: No palpable adenopathy Eyes: Normal external exam Oral: She has shallow ulcers on her lips and just inside the corner of her mouth bilaterally no other oropharyngeal lesions are noted Lungs: Clear Cor: Regular S1 and S2 with no murmurs Abdomen: Soft and nontender Perineum: Examined with her nurse, Lavella Lemons, present. She has bilateral labial swelling with a few small shallow ulcer with visible. She has quite a bit of pain with palpation. Foley catheter is in place. It is slightly moist and erythematous rash on the perineum compatible with candidiasis.  Lab Results Lab Results  Component Value Date   WBC 7.8 05/16/2013   HGB 10.7* 05/16/2013   HCT 33.3* 05/16/2013   MCV 81.2 05/16/2013   PLT 247 05/16/2013    Lab Results  Component Value Date   CREATININE 0.51 05/16/2013   BUN 10 05/16/2013   NA 138 05/16/2013   K 3.9 05/16/2013   CL 100 05/16/2013   CO2 26 05/16/2013    Lab Results  Component Value Date   ALT 9 08/20/2012   AST 17 08/20/2012   ALKPHOS  72 08/20/2012   BILITOT 0.1* 08/20/2012     Microbiology: Recent Results (from the past 240 hour(s))  HERPES SIMPLEX VIRUS CULTURE     Status: None   Collection Time    05/16/13  1:20 PM      Result Value Ref Range Status   Specimen Description VAGINA   Final   Special Requests NONE   Final   Culture     Final   Value: No Herpes Simplex Virus detected.     Performed at [facility]   Report Status 05/19/2013 FINAL   Final  WET PREP, GENITAL     Status: Abnormal   Collection  Time    05/16/13  1:20 PM      Result Value Ref Range Status   Yeast Wet Prep HPF POC NONE SEEN  NONE SEEN Final   Trich, Wet Prep NONE SEEN  NONE SEEN Final   Clue Cells Wet Prep HPF POC NONE SEEN  NONE SEEN Final   WBC, Wet Prep HPF POC FEW (*) NONE SEEN Final   Comment: MODERATE BACTERIA SEEN  GC/CHLAMYDIA PROBE AMP     Status: Abnormal   Collection Time    05/16/13  1:20 PM      Result Value Ref Range Status   CT Probe RNA NEGATIVE  NEGATIVE Final   GC Probe RNA POSITIVE (*) NEGATIVE Final   Comment: (NOTE)     A Positive CT or NG Nucleic Acid Amplification Test (NAAT) result     should be considered presumptive evidence of infection.  The result     should be evaluated along with physical examination and other     diagnostic findings.                                                                                               **Normal Reference Range: Negative**          Assay performed using the Gen-Probe APTIMA COMBO2 (R) Assay.     Acceptable specimen types for this assay include APTIMA Swabs (Unisex,     endocervical, urethral, or vaginal), first void urine, and ThinPrep     liquid based cytology samples.     Performed at [place] [place] [facility] [phone] pager   [phone] cell 05/22/2013, 3:07 PM

## 2013-05-23 DIAGNOSIS — A6000 Herpesviral infection of urogenital system, unspecified: Secondary | ICD-10-CM

## 2013-05-23 DIAGNOSIS — B3749 Other urogenital candidiasis: Secondary | ICD-10-CM

## 2013-05-23 LAB — HIV-1 RNA QUANT-NO REFLEX-BLD: HIV-1 RNA Quant, Log: <1.3 {Log} (ref <1.30)

## 2013-05-23 MED ORDER — OXYCODONE-ACETAMINOPHEN 5-325 MG PO TABS: 1.0000 | ORAL_TABLET | ORAL | Status: DC | PRN

## 2013-05-23 MED ORDER — BENZOCAINE-MENTHOL 20-0.5 % EX AERO
1.0000 "application " | INHALATION_SPRAY | Freq: Four times a day (QID) | CUTANEOUS | Status: DC | PRN
  Administered 2013-05-23: 1 via TOPICAL
  Filled 2013-05-23: qty 56

## 2013-05-23 MED ORDER — FLUCONAZOLE 100 MG PO TABS: 100.0000 mg | ORAL_TABLET | Freq: Every day | ORAL | Status: DC

## 2013-05-23 MED ORDER — ACETAMINOPHEN 325 MG PO TABS: 650.0000 mg | ORAL_TABLET | Freq: Four times a day (QID) | ORAL | Status: DC | PRN

## 2013-05-23 MED ORDER — VALACYCLOVIR HCL 1 G PO TABS: 1000.0000 mg | ORAL_TABLET | Freq: Two times a day (BID) | ORAL | Status: DC

## 2013-05-23 NOTE — Discharge Summary (Signed)
  Physician Discharge Summary  Patient ID: Amy Dougherty MRN: [id number] DOB/AGE: 1995/11/28 18 y.o.  Admit date: 05/16/2013 Discharge date: 05/23/2013  Admission Diagnoses: Oral herpes simplex infection, Genital herpes infection  Discharge Diagnoses:  Principal Problem:   Oral herpes simplex infection Active Problems:   Normocytic anemia   Molluscum contagiosum infection   Chlamydia vaginitis/cervicitis   Genital herpes   Gonorrhea   Candidiasis of perineum   Dermatofibroma of neck   History of eczema   Discharged Condition: stable  Hospital Course: The patient presented with a primary oro/labial herpes outbreak with inability to void.  A foley catheter was placed.  She was started on IV Acyclovir.  Diflucan and broad spectrum antibiotics were initiated.  Infectious disease was consulted.  The patient was able to void without difficulty on the day of discharge.  HIV testing was pending at discharge.  Consults: ID  Significant Diagnostic Studies: labs: vaginal herpes culture negative  Treatments: antibiotics/Acyclovir/Diflucan  Discharge Exam: Blood pressure 121/66, pulse 77, temperature 98.7 F (37.1 C), temperature source Oral, resp. rate 18, height 5\' 6"  (1.676 m), weight 73.993 kg (163 lb 2 oz), last menstrual period 04/21/2013, SpO2 99.00%, not currently breastfeeding. Head: lips with crusted sores GI: soft, non-tender; bowel sounds normal; no masses,  no organomegaly Pelvic: labial edema  Disposition: 01-Home or Self Care  Discharge Orders   Future Orders Complete By Expires   Activity as tolerated - No restrictions  As directed    Call MD for:  extreme fatigue  As directed    Call MD for:  persistant dizziness or light-headedness  As directed    Call MD for:  persistant nausea and vomiting  As directed    Call MD for:  redness, tenderness, or signs of infection (pain, swelling, redness, odor or green/yellow discharge around incision site)  As directed    Call MD  for:  severe uncontrolled pain  As directed    Call MD for:  temperature >100.4  As directed    Diet - low sodium heart healthy  As directed    Discharge wound care:  As directed    Driving Restrictions  As directed    Sexual Activity Restrictions  As directed        Medication List    STOP taking these medications       metroNIDAZOLE 500 MG tablet  Commonly known as:  FLAGYL      TAKE these medications       fluconazole 100 MG tablet  Commonly known as:  DIFLUCAN  Take 1 tablet (100 mg total) by mouth daily.     norethindrone 0.35 MG tablet  Commonly known as:  MICRONOR,CAMILA,ERRIN  Take 1 tablet (0.35 mg total) by mouth daily.     oxyCODONE-acetaminophen 5-325 MG per tablet  Commonly known as:  PERCOCET/ROXICET  Take 1-2 tablets by mouth every 4 (four) hours as needed for severe pain.     valACYclovir 1000 MG tablet  Commonly known as:  VALTREX  Take 1 tablet (1,000 mg total) by mouth 2 (two) times daily.           Follow-up Information   Follow up with Lahoma Crocker A, MD In 4 weeks.   Specialty:  Obstetrics and Gynecology   Contact information:   [address] [place] [address] [place] [address] [phone]       Signed: Lahoma Crocker 05/23/2013, 1:02 PM

## 2013-05-23 NOTE — Progress Notes (Addendum)
Patient ID: Amy Dougherty, female   DOB: Jun 08, 1995, 18 y.o.   MRN: [id number]         [facility]    Date of Admission:  05/16/2013           Day 8 Herpes therapy        Day 2 valacyclovir        Day 2 fluconazole  Principal Problem:   Oral herpes simplex infection Active Problems:   Genital herpes   Gonorrhea   Chlamydia vaginitis/cervicitis   Candidiasis of perineum   Normocytic anemia   Molluscum contagiosum infection   Dermatofibroma of neck   History of eczema   . docusate sodium  100 mg Oral BID  . fluconazole  100 mg Oral Daily  . prenatal multivitamin  1 tablet Oral Q1200  . valACYclovir  1,000 mg Oral BID    Subjective: Her foley catheter was removed this morning and is voiding without difficulty. She is feeling significantly better. She's not having any problems with oral intake.  Objective: Temp:  [98 F (36.7 C)-98.8 F (37.1 C)] 98.3 F (36.8 C) (04/17 0543) Pulse Rate:  [73-103] 73 (04/17 0543) Resp:  [16-20] 16 (04/17 0543) BP: (98-139)/(54-88) 98/54 mmHg (04/17 0543) SpO2:  [97 %-100 %] 97 % (04/17 0543)  General: She is smiling and in good spirits Skin: No generalized rash Oral: All lip lesions crusting  Lab Results Lab Results  Component Value Date   WBC 7.8 05/16/2013   HGB 10.7* 05/16/2013   HCT 33.3* 05/16/2013   MCV 81.2 05/16/2013   PLT 247 05/16/2013    Lab Results  Component Value Date   CREATININE 0.51 05/16/2013   BUN 10 05/16/2013   NA 138 05/16/2013   K 3.9 05/16/2013   CL 100 05/16/2013   CO2 26 05/16/2013    Lab Results  Component Value Date   ALT 9 08/20/2012   AST 17 08/20/2012   ALKPHOS 72 08/20/2012   BILITOT 0.1* 08/20/2012    RPR: nonreactive HIV Ab: Nonreactive HIV Viral Load: pending   Microbiology: Recent Results (from the past 240 hour(s))  HERPES SIMPLEX VIRUS CULTURE     Status: None   Collection Time    05/16/13  1:20 PM      Result Value Ref Range Status   Specimen Description  VAGINA   Final   Special Requests NONE   Final   Culture     Final   Value: No Herpes Simplex Virus detected.     Performed at [facility]   Report Status 05/19/2013 FINAL   Final  WET PREP, GENITAL     Status: Abnormal   Collection Time    05/16/13  1:20 PM      Result Value Ref Range Status   Yeast Wet Prep HPF POC NONE SEEN  NONE SEEN Final   Trich, Wet Prep NONE SEEN  NONE SEEN Final   Clue Cells Wet Prep HPF POC NONE SEEN  NONE SEEN Final   WBC, Wet Prep HPF POC FEW (*) NONE SEEN Final   Comment: MODERATE BACTERIA SEEN  GC/CHLAMYDIA PROBE AMP     Status: Abnormal   Collection Time    05/16/13  1:20 PM      Result Value Ref Range Status   CT Probe RNA NEGATIVE  NEGATIVE Final   GC Probe RNA POSITIVE (*) NEGATIVE Final   Comment: (NOTE)     A Positive CT or NG  Nucleic Acid Amplification Test (NAAT) result     should be considered presumptive evidence of infection.  The result     should be evaluated along with physical examination and other     diagnostic findings.                                                                                               **Normal Reference Range: Negative**          Assay performed using the Gen-Probe APTIMA COMBO2 (R) Assay.     Acceptable specimen types for this assay include APTIMA Swabs (Unisex,     endocervical, urethral, or vaginal), first void urine, and ThinPrep     liquid based cytology samples.     Performed at [facility]: She is improving on empiric therapy for severe, first episode herpes simplex oral and genital ulcers. She has also been treated for gonorrhea. I also have her on fluconazole for perineal candidiasis. Would recommend continuing oral valacyclovir and fluconazole for 6 more days. Her HIV viral load should be resulted later this afternoon. I will call her directly on her cell phone with results.  Plan: 1. Continue valacyclovir and fluconazole for 6 more days 2. Safer sexual  practices counseling provided 3. Recommend discharge home soon 4. Please call me for any questions this weekend  Michel Bickers, MD [facility] [place] [phone] pager   [phone] cell 05/23/2013, 11:45 AM   Addendum:  Her HIV viral load was undetectable indicating that she does not have primary HIV infection. I informed her of these results.   HIV 1 RNA Quant (copies/mL)  Date Value  05/22/2013 <20    Michel Bickers, MD [place] [phone] pager   [phone] cell 05/23/2013, 2:49 PM

## 2013-05-23 NOTE — Progress Notes (Signed)
Pt discharged home with family... Condition stable... No equipment... Ambulated to car with L. Graylon Good, NT.

## 2013-05-23 NOTE — Progress Notes (Addendum)
Subjective: Patient reports she feels she is slowly getting better. Tolerating PO. Having foley d/c'd this am and prefers it doesn't go back in. Has questions regarding decreasing chances of transmission to her children.  Reports mild cramping and wondering if her period is going to happen.   Objective: Filed Vitals:   05/23/13 0543  BP: 98/54  Pulse: 73  Temp: 98.3 F (36.8 C)  Resp: 16    I have reviewed patient's vital signs, intake and output, medications, labs, microbiology and pathology.  General: alert and cooperative GI: soft, non-tender; bowel sounds normal; no masses,  no organomegaly Extremities: extremities normal, atraumatic, no cyanosis or edema Vaginal Bleeding: none Lesions on mouth healing, scabbed over.   Assessment/Plan: D/C foley this morning Continue IV therapy HIV labs pending Discussed treatment for suppression and how to decrease transmission.     LOS: 7 days    Amy Dougherty Thereasa Parkin 05/23/2013, 10:05 AM

## 2013-06-26 ENCOUNTER — Ambulatory Visit (INDEPENDENT_AMBULATORY_CARE_PROVIDER_SITE_OTHER): Payer: Medicaid Other | Admitting: Obstetrics & Gynecology

## 2013-06-26 VITALS — BP 135/85 | HR 86 | Temp 98.4°F | Ht 66.0 in | Wt 164.0 lb

## 2013-06-26 DIAGNOSIS — Z309 Encounter for contraceptive management, unspecified: Secondary | ICD-10-CM

## 2013-06-26 MED ORDER — VALACYCLOVIR HCL 500 MG PO TABS: 500.0000 mg | ORAL_TABLET | Freq: Two times a day (BID) | ORAL | Status: DC

## 2013-06-28 ENCOUNTER — Encounter: Payer: Self-pay | Admitting: Obstetrics & Gynecology

## 2013-06-28 NOTE — Patient Instructions (Signed)
Intrauterine Device Information An intrauterine device (IUD) is inserted into your uterus to prevent pregnancy. There are two types of IUDs available:   Copper IUD This type of IUD is wrapped in copper wire and is placed inside the uterus. Copper makes the uterus and fallopian tubes produce a fluid that kills sperm. The copper IUD can stay in place for 10 years.  Hormone IUD This type of IUD contains the hormone progestin (synthetic progesterone). The hormone thickens the cervical mucus and prevents sperm from entering the uterus. It also thins the uterine lining to prevent implantation of a fertilized egg. The hormone can weaken or kill the sperm that get into the uterus. One type of hormone IUD can stay in place for 5 years, and another type can stay in place for 3 years. Your health care provider will make sure you are a good candidate for a contraceptive IUD. Discuss with your health care provider the possible side effects.  ADVANTAGES OF AN INTRAUTERINE DEVICE  IUDs are highly effective, reversible, long acting, and low maintenance.   There are no estrogen-related side effects.   An IUD can be used when breastfeeding.   IUDs are not associated with weight gain.   The copper IUD works immediately after insertion.   The hormone IUD works right away if inserted within 7 days of your period starting. You will need to use a backup method of birth control for 7 days if the hormone IUD is inserted at any other time in your cycle.  The copper IUD does not interfere with your female hormones.   The hormone IUD can make heavy menstrual periods lighter and decrease cramping.   The hormone IUD can be used for 3 or 5 years.   The copper IUD can be used for 10 years. DISADVANTAGES OF AN INTRAUTERINE DEVICE  The hormone IUD can be associated with irregular bleeding patterns.   The copper IUD can make your menstrual flow heavier and more painful.   You may experience cramping and  vaginal bleeding after insertion.  Document Released: 12/28/2003 Document Revised: 09/25/2012 Document Reviewed: 07/14/2012 [facility] Patient Information 2014 [place]. Etonogestrel implant What is this medicine? ETONOGESTREL (et oh noe JES trel) is a contraceptive (birth control) device. It is used to prevent pregnancy. It can be used for up to 3 years. This medicine may be used for other purposes; ask your health care provider or pharmacist if you have questions. COMMON BRAND NAME(S): Implanon, Nexplanon  What should I tell my health care provider before I take this medicine? They need to know if you have any of these conditions: -abnormal vaginal bleeding -blood vessel disease or blood clots -cancer of the breast, cervix, or liver -depression -diabetes -gallbladder disease -headaches -heart disease or recent heart attack -high blood pressure -high cholesterol -kidney disease -liver disease -renal disease -seizures -tobacco smoker -an unusual or allergic reaction to etonogestrel, other hormones, anesthetics or antiseptics, medicines, foods, dyes, or preservatives -pregnant or trying to get pregnant -breast-feeding How should I use this medicine? This device is inserted just under the skin on the inner side of your upper arm by a health care professional. Talk to your pediatrician regarding the use of this medicine in children. Special care may be needed. Overdosage: If you think you've taken too much of this medicine contact a poison control center or emergency room at once. Overdosage: If you think you have taken too much of this medicine contact a poison control center or emergency room at  once. NOTE: This medicine is only for you. Do not share this medicine with others. What if I miss a dose? This does not apply. What may interact with this medicine? Do not take this medicine with any of the following medications: -amprenavir -bosentan -fosamprenavir This medicine  may also interact with the following medications: -barbiturate medicines for inducing sleep or treating seizures -certain medicines for fungal infections like ketoconazole and itraconazole -griseofulvin -medicines to treat seizures like carbamazepine, felbamate, oxcarbazepine, phenytoin, topiramate -modafinil -phenylbutazone -rifampin -some medicines to treat HIV infection like atazanavir, indinavir, lopinavir, nelfinavir, tipranavir, ritonavir -St. John's wort This list may not describe all possible interactions. Give your health care provider a list of all the medicines, herbs, non-prescription drugs, or dietary supplements you use. Also tell them if you smoke, drink alcohol, or use illegal drugs. Some items may interact with your medicine. What should I watch for while using this medicine? This product does not protect you against HIV infection (AIDS) or other sexually transmitted diseases. You should be able to feel the implant by pressing your fingertips over the skin where it was inserted. Tell your doctor if you cannot feel the implant. What side effects may I notice from receiving this medicine? Side effects that you should report to your doctor or health care professional as soon as possible: -allergic reactions like skin rash, itching or hives, swelling of the face, lips, or tongue -breast lumps -changes in vision -confusion, trouble speaking or understanding -dark urine -depressed mood -general ill feeling or flu-like symptoms -light-colored stools -loss of appetite, nausea -right upper belly pain -severe headaches -severe pain, swelling, or tenderness in the abdomen -shortness of breath, chest pain, swelling in a leg -signs of pregnancy -sudden numbness or weakness of the face, arm or leg -trouble walking, dizziness, loss of balance or coordination -unusual vaginal bleeding, discharge -unusually weak or tired -yellowing of the eyes or skin Side effects that usually do  not require medical attention (Report these to your doctor or health care professional if they continue or are bothersome.): -acne -breast pain -changes in weight -cough -fever or chills -headache -irregular menstrual bleeding -itching, burning, and vaginal discharge -pain or difficulty passing urine -sore throat This list may not describe all possible side effects. Call your doctor for medical advice about side effects. You may report side effects to FDA at 1-800-FDA-1088. Where should I keep my medicine? This drug is given in a hospital or clinic and will not be stored at home. NOTE: This sheet is a summary. It may not cover all possible information. If you have questions about this medicine, talk to your doctor, pharmacist, or health care provider.  2014, Elsevier/Gold Standard. (2011-07-31 15:37:45) Safe Sex Safe sex is about reducing the risk of giving or getting a sexually transmitted disease (STD). STDs are spread through sexual contact involving the genitals, mouth, or rectum. Some STDS can be cured and others cannot. Safe sex can also prevent unintended pregnancies.  SAFE SEX PRACTICES  Limit your sexual activity to only one partner who is only having sex with you.  Talk to your partner about their past partners, past STDs, and drug use.  Use a condom every time you have sexual intercourse. This includes vaginal, oral, and anal sexual activity. Both females and males should wear condoms during oral sex. Only use latex or polyurethane condoms and water-based lubricants. Petroleum-based lubricants or oils used to lubricate a condom will weaken the condom and increase the chance that it will break. The condom  should be in place from the beginning to the end of sexual activity. Wearing a condom reduces, but does not completely eliminate, your risk of getting or giving a STD. STDs can be spread by contact with skin of surrounding areas.  Get vaccinated for hepatitis B and HPV.  Avoid  alcohol and recreational drugs which can affect your judgement. You may forget to use a condom or participate in high-risk sex.  For females, avoid douching after sexual intercourse. Douching can spread an infection farther into the reproductive tract.  Check your body for signs of sores, blisters, rashes, or unusual discharge. See your caregiver if you notice any of these signs.  Avoid sexual contact if you have symptoms of an infection or are being treated for an STD. If you or your partner has herpes, avoid sexual contact when blisters are present. Use condoms at all other times.  See your caregiver for regular screenings, examinations, and tests for STDs. Before having sex with a new partner, each of you should be screened for STDs and talk about the results with your partner. BENEFITS OF SAFE SEX   There is less of a chance of getting or giving an STD.  You can prevent unwanted or unintended pregnancies.  By discussing safer sex concerns with your partner, you may increase feelings of intimacy, comfort, trust, and honesty between the both of you. Document Released: 03/02/2004 Document Revised: 10/18/2011 Document Reviewed: 07/17/2011 [facility] Patient Information 2014 [place].

## 2013-06-28 NOTE — Progress Notes (Signed)
Patient ID: Amy Dougherty, female   DOB: 05-11-1995, 18 y.o.   MRN: [id number]  Chief Complaint  Patient presents with  . Follow-up    HPI Amy Dougherty is a 18 y.o. female.   HPI Recently hospitalized with a severe primary vs first secondary genital herpes outbreak.  No complaints today. Past Medical History  Diagnosis Date  . History of blood clots     Blood clot in  Right overy after delivery  . Wisdom teeth extracted     Past Surgical History  Procedure Laterality Date  . Wisdom tooth extraction      Family History  Problem Relation Age of Onset  . Asthma Brother     Social History History  Substance Use Topics  . Smoking status: Never Smoker   . Smokeless tobacco: Not on file  . Alcohol Use: Yes     Comment: rarely    Allergies  Allergen Reactions  . Apple Swelling  . Ibuprofen Other (See Comments)    Blood thinner medications  . Norethindrone Other (See Comments)    headaches  . Other Itching    Tree nuts    Current Outpatient Prescriptions  Medication Sig Dispense Refill  . norethindrone (MICRONOR,CAMILA,ERRIN) 0.35 MG tablet Take 1 tablet (0.35 mg total) by mouth daily.  1 Package  11  . valACYclovir (VALTREX) 500 MG tablet Take 1 tablet (500 mg total) by mouth 2 (two) times daily. For 3 days as needed for outbreak.  60 tablet  prn  . [DISCONTINUED] cetirizine (ZYRTEC) 10 MG tablet Take 10 mg by mouth daily as needed. For cold symptoms       No current facility-administered medications for this visit.    Review of Systems Review of Systems Constitutional: negative for fatigue and weight loss Respiratory: negative for cough and wheezing Cardiovascular: negative for chest pain, fatigue and palpitations Gastrointestinal: negative for abdominal pain and change in bowel habits Genitourinary:negative for vaginal discharge Integument/breast: negative for nipple discharge Musculoskeletal:negative for myalgias Neurological: negative for gait problems  and tremors Behavioral/Psych: negative for abusive relationship, depression Endocrine: negative for temperature intolerance     Blood pressure 135/85, pulse 86, temperature 98.4 F (36.9 C), height 5\' 6"  (1.676 m), weight 74.39 kg (164 lb), last menstrual period 06/22/2013, not currently breastfeeding.  Physical Exam Physical Exam General:   alert  Skin:   no rash or abnormalities  Lungs:   clear to auscultation bilaterally  Heart:   regular rate and rhythm, S1, S2 normal, no murmur, click, rub or gallop  Abdomen:  normal findings: no organomegaly, soft, non-tender and no hernia  Pelvis:  External genitalia: normal general appearance Urinary system: urethral meatus normal and bladder without fullness, nontender Vaginal: normal without tenderness, induration or masses Cervix: normal appearance Adnexa: normal bimanual exam Uterus: anteverted and non-tender, normal size      Data Reviewed None  Assessment    Normal exam     Plan Counseled re: safe sex practice, [facility] Meds ordered this encounter  Medications  . valACYclovir (VALTREX) 500 MG tablet    Sig: Take 1 tablet (500 mg total) by mouth 2 (two) times daily. For 3 days as needed for outbreak.    Dispense:  60 tablet    Refill:  prn   Follow up in 1 mth        Amy Dougherty 06/28/2013, 1:06 PM

## 2013-07-28 ENCOUNTER — Ambulatory Visit: Payer: Medicaid Other | Admitting: Obstetrics & Gynecology

## 2013-08-28 ENCOUNTER — Ambulatory Visit: Payer: Medicaid Other | Admitting: Obstetrics & Gynecology

## 2013-10-02 ENCOUNTER — Ambulatory Visit: Payer: Medicaid Other | Admitting: Obstetrics & Gynecology

## 2013-11-03 ENCOUNTER — Ambulatory Visit (INDEPENDENT_AMBULATORY_CARE_PROVIDER_SITE_OTHER): Payer: Medicaid Other | Admitting: Obstetrics & Gynecology

## 2013-11-03 ENCOUNTER — Encounter: Payer: Self-pay | Admitting: Obstetrics & Gynecology

## 2013-11-03 VITALS — BP 138/81 | HR 73 | Temp 98.4°F | Wt 151.0 lb

## 2013-11-03 DIAGNOSIS — N760 Acute vaginitis: Secondary | ICD-10-CM

## 2013-11-03 DIAGNOSIS — A499 Bacterial infection, unspecified: Secondary | ICD-10-CM

## 2013-11-03 DIAGNOSIS — B9689 Other specified bacterial agents as the cause of diseases classified elsewhere: Secondary | ICD-10-CM

## 2013-11-03 MED ORDER — METRONIDAZOLE 500 MG PO TABS: 500.0000 mg | ORAL_TABLET | Freq: Two times a day (BID) | ORAL | Status: DC

## 2013-11-03 NOTE — Progress Notes (Signed)
Patient ID: Amy Dougherty, female   DOB: 07-Nov-1995, 18 y.o.   MRN: [id number]  C/O a vaginal discharge  HPI Amy Dougherty is a 18 y.o. female.   Vaginal Discharge The patient's primary symptoms include genital itching, genital lesions, a genital odor and a vaginal discharge. This is a new problem. The current episode started 1 to 4 weeks ago. The problem occurs constantly. The problem has been unchanged. The pain is mild. The problem affects the left side. Associated symptoms include abdominal pain. The vaginal discharge was grey, malodorous and thick. There has been no bleeding. She has not been passing clots. She has not been passing tissue. Nothing aggravates the symptoms. She has tried antifungals for the symptoms. The treatment provided mild relief. She is sexually active. It is unknown whether or not her partner has an STD. She uses nothing for contraception. Her menstrual history has been regular. Her past medical history is significant for herpes simplex, an STD and vaginosis.    Past Medical History  Diagnosis Date  . History of blood clots     Blood clot in  Right overy after delivery  . Wisdom teeth extracted   . Herpes genitalis in women     currently taking medications    Past Surgical History  Procedure Laterality Date  . Wisdom tooth extraction      Family History  Problem Relation Age of Onset  . Asthma Brother     Social History History  Substance Use Topics  . Smoking status: Never Smoker   . Smokeless tobacco: Not on file  . Alcohol Use: Yes     Comment: Pt reports drinking wine and beer at least 3-4 days per week. Pt reports increased used over the past couple of months.    Allergies  Allergen Reactions  . Flagyl [Metronidazole] Rash  . Apple Swelling  . Ibuprofen Other (See Comments)    Blood thinner medications  . Norethindrone Other (See Comments)    headaches  . Other Itching    Tree nuts    No current facility-administered medications for  this visit.   Current Outpatient Prescriptions  Medication Sig Dispense Refill  . [DISCONTINUED] cetirizine (ZYRTEC) 10 MG tablet Take 10 mg by mouth daily as needed. For cold symptoms       Facility-Administered Medications Ordered in Other Visits  Medication Dose Route Frequency Provider Last Rate Last Dose  . acetaminophen (TYLENOL) tablet 650 mg  650 mg Oral Q6H PRN Laverle Hobby, PA-C      . alum & mag hydroxide-simeth (MAALOX/MYLANTA) 200-200-20 MG/5ML suspension 30 mL  30 mL Oral Q4H PRN Laverle Hobby, PA-C      . clindamycin (CLEOCIN) capsule 300 mg  300 mg Oral Q12H Laverle Hobby, PA-C   300 mg at 11/06/13 0908  . doxycycline (VIBRA-TABS) tablet 100 mg  100 mg Oral Q12H Laverle Hobby, PA-C   100 mg at 11/06/13 0908  . hydrOXYzine (ATARAX/VISTARIL) tablet 25 mg  25 mg Oral Q6H PRN Laverle Hobby, PA-C   25 mg at 11/06/13 0910  . magnesium hydroxide (MILK OF MAGNESIA) suspension 30 mL  30 mL Oral Daily PRN Laverle Hobby, PA-C      . traZODone (DESYREL) tablet 50 mg  50 mg Oral QHS,MR X 1 Spencer E Simon, PA-C      . valACYclovir (VALTREX) tablet 500 mg  500 mg Oral Daily Laverle Hobby, PA-C   500 mg at 11/06/13 [phone]  Review of Systems Review of Systems  Gastrointestinal: Positive for abdominal pain.  Genitourinary: Positive for vaginal discharge.   Constitutional: negative for fatigue and weight loss Respiratory: negative for cough and wheezing Cardiovascular: negative for chest pain, fatigue and palpitations Integument/breast: negative for nipple discharge Musculoskeletal:negative for myalgias Neurological: negative for gait problems and tremors Behavioral/Psych: negative for abusive relationship, depression Endocrine: negative for temperature intolerance     Blood pressure 138/81, pulse 73, temperature 98.4 F (36.9 C), weight 68.493 kg (151 lb), last menstrual period 10/13/2013, not currently breastfeeding.  Physical Exam Physical Exam General:   alert,  oriented x 4 well-developed young lady with no acute distress   Skin:   no rash or abnormalities  Lungs:   clear to auscultation bilaterally   Heart:   regular rate and rhythm, S1, S2 normal, no murmur, click, rub or gallop  Breasts:   normal without suspicious masses, skin or nipple changes or axillary nodes  Abdomen:  normal findings: no organomegaly, soft, non-tender and no hernia  Pelvis:  External genitalia: normal general appearance Urinary system: urethral meatus normal and bladder without fullness, nontender Vaginal: thin, white discharge Cervix: normal appearance Adnexa: normal bimanual exam Uterus: anteverted and non-tender, normal size       Data Reviewed None  Assessment    Likely bacterial vaginosis    Plan   UPT * Wet prep/KOH * Encouraged patient regarding continued use of condoms with any sexual intercourse (oral, anal, rectal) * Notify provider for any concerns, comments, or problems * Will notify client with test results.       Orders Placed This Encounter  Procedures  . WET PREP BY MOLECULAR PROBE  . GC/Chlamydia Probe Amp  . WET PREP BY MOLECULAR PROBE   Meds ordered this encounter  Medications  . metroNIDAZOLE (FLAGYL) 500 MG tablet    Sig: Take 1 tablet (500 mg total) by mouth 2 (two) times daily.    Dispense:  14 tablet    Refill:  0   Follow up as needed.         JACKSON-MOORE,Donato Studley A 11/06/2013, 11:13 AM

## 2013-11-04 LAB — GC/CHLAMYDIA PROBE AMP
CT Probe RNA: POSITIVE — AB
GC Probe RNA: NEGATIVE

## 2013-11-05 ENCOUNTER — Encounter (HOSPITAL_COMMUNITY): Payer: Self-pay | Admitting: *Deleted

## 2013-11-05 ENCOUNTER — Inpatient Hospital Stay (HOSPITAL_COMMUNITY)
Admission: AD | Admit: 2013-11-05 | Discharge: 2013-11-11 | DRG: 885 | Disposition: A | Discharge status: Home / Self Care | Payer: Medicaid Other | Attending: Psychiatry | Admitting: Psychiatry

## 2013-11-05 DIAGNOSIS — F4312 Post-traumatic stress disorder, chronic: Secondary | ICD-10-CM | POA: Diagnosis present

## 2013-11-05 DIAGNOSIS — A5609 Other chlamydial infection of lower genitourinary tract: Secondary | ICD-10-CM

## 2013-11-05 DIAGNOSIS — K219 Gastro-esophageal reflux disease without esophagitis: Secondary | ICD-10-CM

## 2013-11-05 DIAGNOSIS — F1210 Cannabis abuse, uncomplicated: Secondary | ICD-10-CM | POA: Diagnosis present

## 2013-11-05 DIAGNOSIS — N938 Other specified abnormal uterine and vaginal bleeding: Secondary | ICD-10-CM

## 2013-11-05 DIAGNOSIS — A6000 Herpesviral infection of urogenital system, unspecified: Secondary | ICD-10-CM

## 2013-11-05 DIAGNOSIS — F314 Bipolar disorder, current episode depressed, severe, without psychotic features: Principal | ICD-10-CM | POA: Diagnosis present

## 2013-11-05 DIAGNOSIS — F323 Major depressive disorder, single episode, severe with psychotic features: Secondary | ICD-10-CM

## 2013-11-05 DIAGNOSIS — G4700 Insomnia, unspecified: Secondary | ICD-10-CM | POA: Diagnosis present

## 2013-11-05 DIAGNOSIS — Z881 Allergy status to other antibiotic agents status: Secondary | ICD-10-CM | POA: Diagnosis not present

## 2013-11-05 DIAGNOSIS — B3749 Other urogenital candidiasis: Secondary | ICD-10-CM

## 2013-11-05 DIAGNOSIS — A549 Gonococcal infection, unspecified: Secondary | ICD-10-CM

## 2013-11-05 DIAGNOSIS — Z825 Family history of asthma and other chronic lower respiratory diseases: Secondary | ICD-10-CM | POA: Diagnosis not present

## 2013-11-05 DIAGNOSIS — F410 Panic disorder [episodic paroxysmal anxiety] without agoraphobia: Secondary | ICD-10-CM | POA: Diagnosis present

## 2013-11-05 DIAGNOSIS — Z91410 Personal history of adult physical and sexual abuse: Secondary | ICD-10-CM | POA: Diagnosis not present

## 2013-11-05 DIAGNOSIS — D234 Other benign neoplasm of skin of scalp and neck: Secondary | ICD-10-CM

## 2013-11-05 DIAGNOSIS — D649 Anemia, unspecified: Secondary | ICD-10-CM

## 2013-11-05 DIAGNOSIS — B002 Herpesviral gingivostomatitis and pharyngotonsillitis: Secondary | ICD-10-CM

## 2013-11-05 DIAGNOSIS — Z86718 Personal history of other venous thrombosis and embolism: Secondary | ICD-10-CM

## 2013-11-05 DIAGNOSIS — B081 Molluscum contagiosum: Secondary | ICD-10-CM

## 2013-11-05 DIAGNOSIS — A5602 Chlamydial vulvovaginitis: Secondary | ICD-10-CM

## 2013-11-05 DIAGNOSIS — Z872 Personal history of diseases of the skin and subcutaneous tissue: Secondary | ICD-10-CM

## 2013-11-05 DIAGNOSIS — F4310 Post-traumatic stress disorder, unspecified: Secondary | ICD-10-CM

## 2013-11-05 HISTORY — DX: Herpesviral infection of other urogenital tract: A60.09

## 2013-11-05 LAB — WET PREP BY MOLECULAR PROBE
Candida species: POSITIVE — AB
Gardnerella vaginalis: POSITIVE — AB
TRICHOMONAS VAG: NEGATIVE

## 2013-11-05 MED ORDER — FLUCONAZOLE 100 MG PO TABS: 100.0000 mg | ORAL_TABLET | Freq: Once | ORAL | Status: DC

## 2013-11-05 MED ORDER — VALACYCLOVIR HCL 500 MG PO TABS
500.0000 mg | ORAL_TABLET | Freq: Every day | ORAL | Status: DC
  Administered 2013-11-06 – 2013-11-11 (×6): 500 mg via ORAL
  Filled 2013-11-05 (×7): qty 1

## 2013-11-05 MED ORDER — ACETAMINOPHEN 325 MG PO TABS
650.0000 mg | ORAL_TABLET | Freq: Four times a day (QID) | ORAL | Status: DC | PRN
  Administered 2013-11-08 – 2013-11-11 (×5): 650 mg via ORAL
  Filled 2013-11-05 (×5): qty 2

## 2013-11-05 MED ORDER — DOXYCYCLINE HYCLATE 100 MG PO TABS
100.0000 mg | ORAL_TABLET | Freq: Two times a day (BID) | ORAL | Status: DC
  Administered 2013-11-06 – 2013-11-11 (×11): 100 mg via ORAL
  Filled 2013-11-05 (×14): qty 1

## 2013-11-05 MED ORDER — VALACYCLOVIR HCL 500 MG PO TABS: 500.0000 mg | ORAL_TABLET | Freq: Two times a day (BID) | ORAL | Status: DC

## 2013-11-05 MED ORDER — CLINDAMYCIN HCL 300 MG PO CAPS
300.0000 mg | ORAL_CAPSULE | Freq: Two times a day (BID) | ORAL | Status: DC
  Administered 2013-11-06 – 2013-11-11 (×11): 300 mg via ORAL
  Filled 2013-11-05 (×14): qty 1

## 2013-11-05 MED ORDER — TRAZODONE HCL 50 MG PO TABS
50.0000 mg | ORAL_TABLET | Freq: Every evening | ORAL | Status: DC | PRN
  Administered 2013-11-06 – 2013-11-08 (×4): 50 mg via ORAL
  Filled 2013-11-05 (×12): qty 1

## 2013-11-05 MED ORDER — ALUM & MAG HYDROXIDE-SIMETH 200-200-20 MG/5ML PO SUSP: 30.0000 mL | ORAL | Status: DC | PRN

## 2013-11-05 MED ORDER — MAGNESIUM HYDROXIDE 400 MG/5ML PO SUSP: 30.0000 mL | Freq: Every day | ORAL | Status: DC | PRN

## 2013-11-05 MED ORDER — HYDROXYZINE HCL 25 MG PO TABS
25.0000 mg | ORAL_TABLET | Freq: Four times a day (QID) | ORAL | Status: DC | PRN
  Administered 2013-11-06 – 2013-11-10 (×4): 25 mg via ORAL
  Filled 2013-11-05 (×4): qty 1
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
## 2013-11-05 NOTE — Tx Team (Signed)
Initial Interdisciplinary Treatment Plan   PATIENT STRESSORS: Health problems Medication change or noncompliance Occupational concerns   PROBLEM LIST: Problem List/Patient Goals Date to be addressed Date deferred Reason deferred Estimated date of resolution  Depression 11-05-13     Medical issues (STD's) 11-05-13     "my mood is unstable" 11-05-13                                          DISCHARGE CRITERIA:  Improved stabilization in mood, thinking, and/or behavior Medical problems require only outpatient monitoring Need for constant or close observation no longer present Safe-care adequate arrangements made Verbal commitment to aftercare and medication compliance  PRELIMINARY DISCHARGE PLAN: Outpatient therapy Participate in family therapy Return to previous living arrangement Return to previous work or school arrangements  PATIENT/FAMIILY INVOLVEMENT: This treatment plan has been presented to and reviewed with the patient, Amy Dougherty, and/or family member.  The patient and family have been given the opportunity to ask questions and make suggestions.  [name] 11/05/2013, 11:53 PM

## 2013-11-06 ENCOUNTER — Encounter: Payer: Self-pay | Admitting: Obstetrics & Gynecology

## 2013-11-06 DIAGNOSIS — F4310 Post-traumatic stress disorder, unspecified: Secondary | ICD-10-CM

## 2013-11-06 DIAGNOSIS — F329 Major depressive disorder, single episode, unspecified: Secondary | ICD-10-CM

## 2013-11-06 DIAGNOSIS — F1010 Alcohol abuse, uncomplicated: Secondary | ICD-10-CM

## 2013-11-06 LAB — POCT WET PREP (WET MOUNT)
Clue Cells Wet Prep Whiff POC: POSITIVE
KOH WET PREP POC: NEGATIVE

## 2013-11-06 LAB — COMPREHENSIVE METABOLIC PANEL
ALT: 14 U/L (ref 0–35)
AST: 17 U/L (ref 0–37)
Albumin: 4 g/dL (ref 3.5–5.2)
Alkaline Phosphatase: 72 U/L (ref 39–117)
Anion gap: 15 (ref 5–15)
BUN: 14 mg/dL (ref 6–23)
CALCIUM: 9.3 mg/dL (ref 8.4–10.5)
CO2: 21 mEq/L (ref 19–32)
Chloride: 102 mEq/L (ref 96–112)
Creatinine, Ser: 0.68 mg/dL (ref 0.50–1.10)
GFR calc non Af Amer: >90 mL/min (ref >90)
GLUCOSE: 113 mg/dL — AB (ref 70–99)
Potassium: 4.5 mEq/L (ref 3.7–5.3)
SODIUM: 138 meq/L (ref 137–147)
TOTAL PROTEIN: 8.1 g/dL (ref 6.0–8.3)
Total Bilirubin: <0.2 mg/dL — ABNORMAL LOW (ref 0.3–1.2)

## 2013-11-06 LAB — CBC
HCT: 35.5 % — ABNORMAL LOW (ref 36.0–46.0)
Hemoglobin: 11.4 g/dL — ABNORMAL LOW (ref 12.0–15.0)
MCH: 24.7 pg — AB (ref 26.0–34.0)
MCHC: 32.1 g/dL (ref 30.0–36.0)
MCV: 77 fL — ABNORMAL LOW (ref 78.0–100.0)
PLATELETS: 335 10*3/uL (ref 150–400)
RBC: 4.61 MIL/uL (ref 3.87–5.11)
RDW: 14.9 % (ref 11.5–15.5)
WBC: 7.9 10*3/uL (ref 4.0–10.5)

## 2013-11-06 LAB — URINALYSIS, ROUTINE W REFLEX MICROSCOPIC
Bilirubin Urine: NEGATIVE
GLUCOSE, UA: NEGATIVE mg/dL
HGB URINE DIPSTICK: NEGATIVE
Ketones, ur: NEGATIVE mg/dL
Nitrite: NEGATIVE
PROTEIN: NEGATIVE mg/dL
Specific Gravity, Urine: 1.025 (ref 1.005–1.030)
Urobilinogen, UA: 0.2 mg/dL (ref 0.0–1.0)
pH: 5.5 (ref 5.0–8.0)

## 2013-11-06 LAB — URINE MICROSCOPIC-ADD ON

## 2013-11-06 LAB — PREGNANCY, URINE: Preg Test, Ur: NEGATIVE

## 2013-11-06 LAB — BILIRUBIN, DIRECT: Bilirubin, Direct: <0.2 mg/dL (ref 0.0–0.3)

## 2013-11-06 MED ORDER — CHLORDIAZEPOXIDE HCL 25 MG PO CAPS
25.0000 mg | ORAL_CAPSULE | Freq: Four times a day (QID) | ORAL | Status: DC | PRN
  Administered 2013-11-10: 25 mg via ORAL
  Filled 2013-11-06: qty 1

## 2013-11-06 MED ORDER — SERTRALINE HCL 50 MG PO TABS
50.0000 mg | ORAL_TABLET | Freq: Every day | ORAL | Status: DC
  Administered 2013-11-06 – 2013-11-10 (×5): 50 mg via ORAL
  Filled 2013-11-06 (×7): qty 1

## 2013-11-06 MED ORDER — RISPERIDONE 2 MG PO TABS
2.0000 mg | ORAL_TABLET | Freq: Every day | ORAL | Status: DC
  Administered 2013-11-06 – 2013-11-10 (×5): 2 mg via ORAL
  Filled 2013-11-06 (×6): qty 1

## 2013-11-06 MED ORDER — ENSURE COMPLETE PO LIQD
237.0000 mL | Freq: Two times a day (BID) | ORAL | Status: DC
  Administered 2013-11-06: 237 mL via ORAL

## 2013-11-06 NOTE — Patient Instructions (Signed)

## 2013-11-06 NOTE — Progress Notes (Signed)
The focus of this group is to educate the patient on the purpose and policies of crisis stabilization and provide a format to answer questions about their admission.  The group details unit policies and expectations of patients while admitted.  Patient did not attend 0900 nurse education orientation group this morning.  Patient stayed in bed.   
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
## 2013-11-06 NOTE — [facility] Group Notes (Signed)
[place] LCSW Group Therapy 11/06/2013 1:15 PM Type of Therapy: Group Therapy Participation Level: Active  Participation Quality: Attentive, Sharing and Supportive  Affect: Depressed and Flat  Cognitive: Alert and Oriented  Insight: Developing/Improving and Engaged  Engagement in Therapy: Developing/Improving and Engaged  Modes of Intervention: Activity, Clarification, Confrontation, Discussion, Education, Exploration, Limit-setting, Orientation, Problem-solving, Rapport Building, Art therapist, Socialization and Support  Summary of Progress/Problems: Patient was attentive and engaged with speaker from [place]. Patient was attentive to speaker while they shared their story of dealing with mental health and overcoming it. Patient expressed interest in their programs and services and received information on their agency. Patient processed ways they can relate to the speaker.   Tilden Fossa, MSW, [place] Worker [facility] [phone]
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
## 2013-11-06 NOTE — Progress Notes (Signed)
[facility] Notes:  (Nursing/MHT/Case Management/Adjunct)  Date:  11/06/2013  Time:  2100 Type of Therapy:  wrap up group  Participation Level:  Active  Participation Quality:  Appropriate, Attentive and Sharing  Affect:  Flat and Tearful  Cognitive:  Appropriate  Insight:  Lacking  Engagement in Group:  Engaged  Modes of Intervention:  Clarification, Education and Support  Summary of Progress/Problems: Pt wants to be a better mom to her two kids aged 20 and 24.  Pt reports being overwhelmed with her life.  School, work, raising her two boys, and "thoughts" she is struggling with. Pt shared that she feels bad about not following through with promises like watching movies with her boys and just giving them a bath and putting them to bed early because her mood has changed.  Pt reports feeling better physically, emotionally, and mentally. When asked about the thoughts, pt shares that its about her past and the Dr knows. Pt encouraged to take care of herself so she can take care of those boys.  Jacques Navy 11/06/2013, 11:31 PM

## 2013-11-06 NOTE — Progress Notes (Signed)
D: Patient denies SI. Rates depression at a 9. Rates anxiety and hopelessness at an 8. Denies SI and AV hallucinations. A: continue to monitor for safety. R: Safety maintained. Libby Maw, RN

## 2013-11-06 NOTE — Progress Notes (Signed)
NUTRITION ASSESSMENT  Pt identified as at risk on the Malnutrition Screen Tool  INTERVENTION: 1. Educated patient on the importance of nutrition and encouraged intake of food and beverages. 2. Supplements: Ensure Complete BID  NUTRITION DIAGNOSIS: Unintentional weight loss related to sub-optimal intake as evidenced by pt report.   Goal: Pt to meet >/= 90% of their estimated nutrition needs.  Monitor:  PO intake  Assessment:  Admitted with mood instability, symptoms of depression, PTSD, and a history of paranoid, self referential ideations, although with some preserved reality testing.  - Met with pt who reports on/off appetite at home - Drinks 2 Ensure/day and eats 2 meals/day  - Agreeable to getting Ensure Complete during admission  - Weight down 10 pounds in the past 5 months   18 y.o. female  Height: Ht Readings from Last 1 Encounters:  11/05/13 _0  (1.676 m)    Weight: Wt Readings from Last 1 Encounters:  11/05/13 154 lb (69.854 kg)    Weight Hx: Wt Readings from Last 10 Encounters:  11/05/13 154 lb (69.854 kg)  11/03/13 151 lb (68.493 kg)  06/26/13 164 lb (74.39 kg)  05/19/13 163 lb 2 oz (73.993 kg)  02/19/13 152 lb (68.947 kg) (82%*, Z = 0.90)  01/16/13 145 lb (65.772 kg) (75%*, Z = 0.68)  12/19/12 147 lb (66.679 kg) (78%*, Z = 0.76)  12/12/12 148 lb (67.132 kg) (79%*, Z = 0.79)  11/28/12 152 lb (68.947 kg) (82%*, Z = 0.92)  10/14/12 163 lb (73.936 kg) (89%*, Z = 1.23)   * Growth percentiles are based on CDC 2-20 Years data.    BMI:  Body mass index is 24.87 kg/(m^2). Pt meets criteria for normal weight based on current BMI.  Estimated Nutritional Needs: Kcal: 25-30 kcal/kg Protein: > 1 gram protein/kg Fluid: 1 ml/kcal  Diet Order: General Pt is also offered choice of unit snacks mid-morning and mid-afternoon.  Pt is eating as desired.   Lab results and medications reviewed.   Carlis Stable MS, [place], LDN [phone] Pager [phone]  Weekend/After Hours Pager

## 2013-11-06 NOTE — [facility] Suicide Risk Assessment (Signed)
[facility] INPATIENT:  Family/Significant Other Suicide Prevention Education  Suicide Prevention Education:  Patient Refusal for Family/Significant Other Suicide Prevention Education: The patient Amy Dougherty has refused to provide written consent for family/significant other to be provided Family/Significant Other Suicide Prevention Education during admission and/or prior to discharge.  Physician notified. CSW provided SPE to patient and provided brochure. Patient had no further questions.  Rihan Schueler, Casimiro Needle 11/06/2013, 1:04 PM

## 2013-11-07 LAB — LIPID PANEL
CHOLESTEROL: 167 mg/dL (ref 0–200)
Cholesterol: 182 mg/dL (ref 0–200)
HDL: 78 mg/dL (ref >39)
HDL: 86 mg/dL (ref >39)
LDL CALC: 67 mg/dL (ref 0–99)
LDL Cholesterol: 80 mg/dL (ref 0–99)
TRIGLYCERIDES: 68 mg/dL (ref <150)
Total CHOL/HDL Ratio: 2.3 RATIO
Total CHOL/HDL Ratio: 1.9 RATIO
Triglycerides: 120 mg/dL (ref <150)
VLDL: 24 mg/dL (ref 0–40)
VLDL: 14 mg/dL (ref 0–40)

## 2013-11-07 LAB — BASIC METABOLIC PANEL
Anion gap: 11 (ref 5–15)
BUN: 11 mg/dL (ref 6–23)
CO2: 23 meq/L (ref 19–32)
Calcium: 8.8 mg/dL (ref 8.4–10.5)
Chloride: 100 mEq/L (ref 96–112)
Creatinine, Ser: 0.55 mg/dL (ref 0.50–1.10)
GFR calc Af Amer: >90 mL/min (ref >90)
GLUCOSE: 84 mg/dL (ref 70–99)
POTASSIUM: 4.1 meq/L (ref 3.7–5.3)
Sodium: 134 mEq/L — ABNORMAL LOW (ref 137–147)

## 2013-11-07 LAB — CBC WITH DIFFERENTIAL/PLATELET
Basophils Absolute: 0 10*3/uL (ref 0.0–0.1)
Basophils Relative: 0 % (ref 0–1)
EOS ABS: 0.2 10*3/uL (ref 0.0–0.7)
EOS PCT: 3 % (ref 0–5)
HCT: 33.9 % — ABNORMAL LOW (ref 36.0–46.0)
Hemoglobin: 10.8 g/dL — ABNORMAL LOW (ref 12.0–15.0)
LYMPHS ABS: 2.8 10*3/uL (ref 0.7–4.0)
LYMPHS PCT: 41 % (ref 12–46)
MCH: 24.9 pg — AB (ref 26.0–34.0)
MCHC: 31.9 g/dL (ref 30.0–36.0)
MCV: 78.1 fL (ref 78.0–100.0)
MONOS PCT: 8 % (ref 3–12)
Monocytes Absolute: 0.5 10*3/uL (ref 0.1–1.0)
Neutro Abs: 3.2 10*3/uL (ref 1.7–7.7)
Neutrophils Relative %: 48 % (ref 43–77)
Platelets: 275 10*3/uL (ref 150–400)
RBC: 4.34 MIL/uL (ref 3.87–5.11)
RDW: 14.9 % (ref 11.5–15.5)
WBC: 6.7 10*3/uL (ref 4.0–10.5)

## 2013-11-07 LAB — HEPATITIS B SURFACE ANTIGEN: Hepatitis B Surface Ag: NEGATIVE

## 2013-11-07 LAB — HIV ANTIBODY (ROUTINE TESTING W REFLEX): HIV 1&2 Ab, 4th Generation: NONREACTIVE

## 2013-11-07 LAB — HEPATITIS C ANTIBODY: HCV Ab: NEGATIVE

## 2013-11-07 LAB — TSH: TSH: 0.65 u[IU]/mL (ref 0.350–4.500)

## 2013-11-07 NOTE — Progress Notes (Signed)
[facility] MD Progress Note  11/07/2013 11:43 AM Amy Dougherty  MRN:  [id number] Subjective: " I am tired" Objective: I have discussed case with  tretment team and have met with patient. There is partial improvement compared to yesterday. She is less guarded, and presents with an improved range of affect and better eye contact. She does report feeling tired and sedated this morning, which she attributes to Trazodone. We discussed- she does not want to completely stop Trazodone, as it did help her sleep better. She is denying side effects from current Risperidone / Zoloft trials. We reviewed side effects, to include potential for movement disorders/dystonia and metabolic disturbances. No disruptive behaviors on unit. She has attended some groups, but at other times stays in bed/ tends to isolate. Today she does not endorse hallucinations and states she is not feeling as paranoid. No delusions are expressed at this time. We discussed likely negative impact that alcohol and drug ( cannabis) abuse may be having on her mental health. Insight seems limited at this time. Not currently presenting with any alcohol withdrawal symptoms. Does not present with tremors, diaphoresis, and vitals are stable.  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 PTSD by History. Alcohol Abuse, Cannabis Abuse    Total Time spent with patient: 20 minutes   ADL's:  Fair   Sleep: fair  Appetite:  Fair   Suicidal Ideation:  Denies any current suicidal ideations Homicidal Ideation:  Denies any homicidal ideations AEB (as evidenced by):  Psychiatric Specialty Exam: Physical Exam  Review of Systems  Constitutional: Negative for fever and chills.  HENT: Negative.        Describes " cotton mouth"/ dry mouth, possibly related to trazodone .  Respiratory: Negative for cough.   Cardiovascular: Negative for chest pain.  Gastrointestinal: Negative for nausea, vomiting and abdominal pain.   Genitourinary: Negative for dysuria and urgency.  Skin: Negative for rash.  Psychiatric/Behavioral: Positive for depression and substance abuse.    Blood pressure 120/67, pulse 96, temperature 98.6 F (37 C), temperature source Oral, resp. rate 16, height 5' 6" (1.676 m), weight 69.854 kg (154 lb), last menstrual period 10/13/2013, not currently breastfeeding.Body mass index is 24.87 kg/(m^2).  General Appearance: Fairly Groomed  Engineer, water::  Good- overall better related than yesterday   Speech:  Slow  Volume:  Normal  Mood:  Depressed and but improved compared to initial presentation, affect more reactive   Affect:  more reactive , smiles at times appropriately  Thought Process:  Linear  Orientation:  Other:  fully alert and attentive   Thought Content:  at this time denies hallucinations and does not appear internally preoccupied at present. No delusions expressed today, seems less guarded   Suicidal Thoughts:  No- at present denies any suicidal or homicidal ideations  Homicidal Thoughts:  No  Memory:  recent and remote grossly intact   Judgement:  Fair  Insight:  Fair  Psychomotor Activity:  Normal  Concentration:  Good  Recall:  Good  Fund of Knowledge:Good  Language: Good  Akathisia:  Negative  Handed:  Right  AIMS (if indicated):     Assets:  Desire for Improvement Physical Health Resilience  Sleep:  Number of Hours: 4.75   Musculoskeletal: Strength & Muscle Tone: within normal limits Gait & Station: normal Patient leans: N/A  Current Medications: Current Facility-Administered Medications  Medication Dose Route Frequency Provider Last Rate Last Dose  . acetaminophen (TYLENOL) tablet 650 mg  650 mg Oral Q6H PRN Frederico Hamman  E Simon, PA-C      . alum & mag hydroxide-simeth (MAALOX/MYLANTA) 200-200-20 MG/5ML suspension 30 mL  30 mL Oral Q4H PRN Laverle Hobby, PA-C      . chlordiazePOXIDE (LIBRIUM) capsule 25 mg  25 mg Oral QID PRN Neita Garnet, MD      . clindamycin  (CLEOCIN) capsule 300 mg  300 mg Oral Q12H Laverle Hobby, PA-C   300 mg at 11/07/13 0815  . doxycycline (VIBRA-TABS) tablet 100 mg  100 mg Oral Q12H Laverle Hobby, PA-C   100 mg at 11/07/13 0815  . feeding supplement (ENSURE COMPLETE) (ENSURE COMPLETE) liquid 237 mL  237 mL Oral BID BM Toribio Harbour, RD   237 mL at 11/06/13 1600  . hydrOXYzine (ATARAX/VISTARIL) tablet 25 mg  25 mg Oral Q6H PRN Laverle Hobby, PA-C   25 mg at 11/06/13 2206  . magnesium hydroxide (MILK OF MAGNESIA) suspension 30 mL  30 mL Oral Daily PRN Laverle Hobby, PA-C      . risperiDONE (RISPERDAL) tablet 2 mg  2 mg Oral QHS Neita Garnet, MD   2 mg at 11/06/13 2154  . sertraline (ZOLOFT) tablet 50 mg  50 mg Oral Daily Neita Garnet, MD   50 mg at 11/07/13 0817  . traZODone (DESYREL) tablet 50 mg  50 mg Oral QHS,MR X 1 Laverle Hobby, PA-C   50 mg at 11/06/13 2155  . valACYclovir (VALTREX) tablet 500 mg  500 mg Oral Daily Laverle Hobby, PA-C   500 mg at 11/07/13 8242    Lab Results:  Results for orders placed during the hospital encounter of 11/05/13 (from the past 48 hour(s))  PREGNANCY, URINE     Status: None   Collection Time    11/05/13 11:33 PM      Result Value Ref Range   Preg Test, Ur NEGATIVE  NEGATIVE   Comment:            THE SENSITIVITY OF THIS     METHODOLOGY IS >20 mIU/mL.     Performed at [facility] MICROSCOPIC     Status: Abnormal   Collection Time    11/05/13 11:33 PM      Result Value Ref Range   Color, Urine YELLOW  YELLOW   APPearance CLEAR  CLEAR   Specific Gravity, Urine 1.025  1.005 - 1.030   pH 5.5  5.0 - 8.0   Glucose, UA NEGATIVE  NEGATIVE mg/dL   Hgb urine dipstick NEGATIVE  NEGATIVE   Bilirubin Urine NEGATIVE  NEGATIVE   Ketones, ur NEGATIVE  NEGATIVE mg/dL   Protein, ur NEGATIVE  NEGATIVE mg/dL   Urobilinogen, UA 0.2  0.0 - 1.0 mg/dL   Nitrite NEGATIVE  NEGATIVE   Leukocytes, UA TRACE (*) NEGATIVE   Comment:  Performed at [place] ON     Status: Abnormal   Collection Time    11/05/13 11:33 PM      Result Value Ref Range   Squamous Epithelial / LPF FEW (*) RARE   WBC, UA 3-6  <3 WBC/hpf   RBC / HPF 0-2  <3 RBC/hpf   Bacteria, UA FEW (*) RARE   Casts HYALINE CASTS (*) NEGATIVE   Urine-Other MUCOUS PRESENT     Comment: Performed at [facility]  CBC     Status: Abnormal   Collection Time    11/06/13  8:15 PM  Result Value Ref Range   WBC 7.9  4.0 - 10.5 K/uL   RBC 4.61  3.87 - 5.11 MIL/uL   Hemoglobin 11.4 (*) 12.0 - 15.0 g/dL   HCT 35.5 (*) 36.0 - 46.0 %   MCV 77.0 (*) 78.0 - 100.0 fL   MCH 24.7 (*) 26.0 - 34.0 pg   MCHC 32.1  30.0 - 36.0 g/dL   RDW 14.9  11.5 - 15.5 %   Platelets 335  150 - 400 K/uL   Comment: Performed at [place] PANEL     Status: Abnormal   Collection Time    11/06/13  8:15 PM      Result Value Ref Range   Sodium 138  137 - 147 mEq/L   Potassium 4.5  3.7 - 5.3 mEq/L   Chloride 102  96 - 112 mEq/L   CO2 21  19 - 32 mEq/L   Glucose, Bld 113 (*) 70 - 99 mg/dL   BUN 14  6 - 23 mg/dL   Creatinine, Ser 0.68  0.50 - 1.10 mg/dL   Calcium 9.3  8.4 - 10.5 mg/dL   Total Protein 8.1  6.0 - 8.3 g/dL   Albumin 4.0  3.5 - 5.2 g/dL   AST 17  0 - 37 U/L   ALT 14  0 - 35 U/L   Alkaline Phosphatase 72  39 - 117 U/L   Total Bilirubin <0.2 (*) 0.3 - 1.2 mg/dL   GFR calc non Af Amer >90  >90 mL/min   GFR calc Af Amer >90  >90 mL/min   Comment: (NOTE)     The eGFR has been calculated using the CKD EPI equation.     This calculation has not been validated in all clinical situations.     eGFR's persistently <90 mL/min signify possible Chronic Kidney     Disease.   Anion gap 15  5 - 15   Comment: Performed at [facility]  LIPID PANEL     Status: None   Collection Time    11/06/13  8:15 PM      Result Value Ref Range   Cholesterol 182  0 -  200 mg/dL   Triglycerides 120  <150 mg/dL   HDL 78  >39 mg/dL   Total CHOL/HDL Ratio 2.3     VLDL 24  0 - 40 mg/dL   LDL Cholesterol 80  0 - 99 mg/dL   Comment:            Total Cholesterol/HDL:CHD Risk     Coronary Heart Disease Risk Table                         Men   Women      1/2 Average Risk   3.4   3.3      Average Risk       5.0   4.4      2 X Average Risk   9.6   7.1      3 X Average Risk  23.4   11.0                Use the calculated Patient Ratio     above and the CHD Risk Table     to determine the patient's CHD Risk.                ATP III CLASSIFICATION (LDL):      <  100     mg/dL   Optimal      100-129  mg/dL   Near or Above                        Optimal      130-159  mg/dL   Borderline      160-189  mg/dL   High      >190     mg/dL   Very High     Performed at [facility]  TSH     Status: None   Collection Time    11/06/13  8:15 PM      Result Value Ref Range   TSH 0.650  0.350 - 4.500 uIU/mL   Comment: Performed at [place], DIRECT     Status: None   Collection Time    11/06/13  8:15 PM      Result Value Ref Range   Bilirubin, Direct <0.2  0.0 - 0.3 mg/dL   Comment: Performed at [facility]  HIV ANTIBODY (ROUTINE TESTING)     Status: None   Collection Time    11/07/13  6:30 AM      Result Value Ref Range   HIV 1&2 Ab, 4th Generation NONREACTIVE  NONREACTIVE   Comment: (NOTE)     A NONREACTIVE HIV Ag/Ab result does not exclude HIV infection since     the time frame for seroconversion is variable. If acute HIV infection     is suspected, a HIV-1 RNA Qualitative TMA test is recommended.     HIV-1/2 Antibody Diff         Not indicated.     HIV-1 RNA, Qual TMA           Not indicated.     PLEASE NOTE: This information has been disclosed to you from records     whose confidentiality may be protected by state law. If your state     requires such protection, then the state law prohibits you from making      any further disclosure of the information without the specific written     consent of the person to whom it pertains, or as otherwise permitted     by law. A general authorization for the release of medical or other     information is NOT sufficient for this purpose.     The performance of this assay has not been clinically validated in     patients less than 13 years old.     Performed at [place]     Status: None   Collection Time    11/07/13  6:30 AM      Result Value Ref Range   HCV Ab NEGATIVE  NEGATIVE   Comment: (NOTE)     Effective December 22, 2013, Hepatitis C Antibody (test code [id number])     will be revised to automatically reflex to the Hepatitis C Viral RNA,     Quantitative, Real-Time PCR assay if the antibody screening result is     Reactive. This action is being taken to ensure that the CDC/USPSTF     recommended HCV diagnostic algorithm with the appropriate test reflex     needed for accurate interpretation is followed.     As of December 22, 2013 the name of the test code 23620 will be     Hepatitis C Antibody with Reflex to HCV  RNA, Quantitative, Real-Time     PCR. This change will also be reflected in standard and custom     profiles that currently include test code 23620.     Performed at [place]     Status: None   Collection Time    11/07/13  6:30 AM      Result Value Ref Range   Hepatitis B Surface Ag NEGATIVE  NEGATIVE   Comment: Performed at [facility]  CBC WITH DIFFERENTIAL     Status: Abnormal   Collection Time    11/07/13  6:30 AM      Result Value Ref Range   WBC 6.7  4.0 - 10.5 K/uL   RBC 4.34  3.87 - 5.11 MIL/uL   Hemoglobin 10.8 (*) 12.0 - 15.0 g/dL   HCT 33.9 (*) 36.0 - 46.0 %   MCV 78.1  78.0 - 100.0 fL   MCH 24.9 (*) 26.0 - 34.0 pg   MCHC 31.9  30.0 - 36.0 g/dL   RDW 14.9  11.5 - 15.5 %   Platelets 275  150 - 400 K/uL   Neutrophils Relative % 48  43 - 77 %    Neutro Abs 3.2  1.7 - 7.7 K/uL   Lymphocytes Relative 41  12 - 46 %   Lymphs Abs 2.8  0.7 - 4.0 K/uL   Monocytes Relative 8  3 - 12 %   Monocytes Absolute 0.5  0.1 - 1.0 K/uL   Eosinophils Relative 3  0 - 5 %   Eosinophils Absolute 0.2  0.0 - 0.7 K/uL   Basophils Relative 0  0 - 1 %   Basophils Absolute 0.0  0.0 - 0.1 K/uL   Comment: Performed at [place] PANEL     Status: Abnormal   Collection Time    11/07/13  6:30 AM      Result Value Ref Range   Sodium 134 (*) 137 - 147 mEq/L   Potassium 4.1  3.7 - 5.3 mEq/L   Chloride 100  96 - 112 mEq/L   CO2 23  19 - 32 mEq/L   Glucose, Bld 84  70 - 99 mg/dL   BUN 11  6 - 23 mg/dL   Creatinine, Ser 0.55  0.50 - 1.10 mg/dL   Calcium 8.8  8.4 - 10.5 mg/dL   GFR calc non Af Amer >90  >90 mL/min   GFR calc Af Amer >90  >90 mL/min   Comment: (NOTE)     The eGFR has been calculated using the CKD EPI equation.     This calculation has not been validated in all clinical situations.     eGFR's persistently <90 mL/min signify possible Chronic Kidney     Disease.   Anion gap 11  5 - 15   Comment: Performed at [facility]  LIPID PANEL     Status: None   Collection Time    11/07/13  6:30 AM      Result Value Ref Range   Cholesterol 167  0 - 200 mg/dL   Triglycerides 68  <150 mg/dL   HDL 86  >39 mg/dL   Total CHOL/HDL Ratio 1.9     VLDL 14  0 - 40 mg/dL   LDL Cholesterol 67  0 - 99 mg/dL   Comment:            Total Cholesterol/HDL:CHD Risk  Coronary Heart Disease Risk Table                         Men   Women      1/2 Average Risk   3.4   3.3      Average Risk       5.0   4.4      2 X Average Risk   9.6   7.1      3 X Average Risk  23.4   11.0                Use the calculated Patient Ratio     above and the CHD Risk Table     to determine the patient's CHD Risk.                ATP III CLASSIFICATION (LDL):      <100     mg/dL   Optimal      100-129  mg/dL   Near or Above                         Optimal      130-159  mg/dL   Borderline      160-189  mg/dL   High      >190     mg/dL   Very High     Performed at [facility]    Physical Findings: AIMS: Facial and Oral Movements Muscles of Facial Expression: None, normal Lips and Perioral Area: None, normal Jaw: None, normal Tongue: None, normal,Extremity Movements Upper (arms, wrists, hands, fingers): None, normal Lower (legs, knees, ankles, toes): None, normal, Trunk Movements Neck, shoulders, hips: None, normal, Overall Severity Severity of abnormal movements (highest score from questions above): None, normal Incapacitation due to abnormal movements: None, normal Patient's awareness of abnormal movements (rate only patient's report): No Awareness, Dental Status Current problems with teeth and/or dentures?: No Does patient usually wear dentures?: No  CIWA:  CIWA-Ar Total: 0 COWS:     Assessment: Today somewhat improved compared to admission. Seems less guarded, less suspicious, and at this time not exhibiting any overt delusions or psychotic ideations. She  Is presenting with a slightly improved range of affect. Denies medication side effects except for dry mouth and AM sedation from Trazodone   Treatment Plan Summary: Daily contact with patient to assess and evaluate symptoms and progress in treatment Medication management See below   Plan: Continue inpatient treatment and support Continue Librium PRNs potential ETOH withdrawal Continue Zoloft 50 mgrs QDAY Continue Risperidone 2 mgrs QHS Decrease Trazodone to 25 mgrs QHS PRN Insomnia to minimize AM sedation    Medical Decision Making Problem Points:  Established problem, stable/improving (1), Review of last therapy session (1) and Review of psycho-social stressors (1) Data Points:  Review or order clinical lab tests (1) Review of medication regiment & side effects (2)  I certify that inpatient services furnished can reasonably be  expected to improve the patient's condition.   COBOS, [place] 11/07/2013, 11:43 AM

## 2013-11-07 NOTE — Telephone Encounter (Signed)
none

## 2013-11-07 NOTE — Tx Team (Signed)
Interdisciplinary Treatment Plan Update (Adult) Date: 11/07/2013   Time Reviewed: 9:30 AM  Progress in Treatment: Attending groups: Continuing to assess, patient new to milieu Participating in groups: Continuing to assess, patient new to milieu Taking medication as prescribed: Yes Tolerating medication: Yes Family/Significant other contact made: No, patient has declined for CSW to make collateral contact at this time. Discussing patient identified problems/goals with staff: Yes Medical problems stabilized or resolved: Yes Denies suicidal/homicidal ideation: CSW continuing to assess Issues/concerns per patient self-inventory: Yes Other:  New problem(s) identified: N/A  Discharge Plan or Barriers: Patient experiencing paranoia and is guarded. She plans to return to her home with her children at discharge and is agreeable to follow up with [facility] the [place] for outpatient services.   Reason for Continuation of Hospitalization:  Depression Anxiety Medication Stabilization   Comments: N/A  Estimated length of stay: 3-5 days   For review of initial/current patient goals, please see plan of care. Patient reports that she would like to learn how to better cope with daily stressors. Patient and CSW reviewed pt's identified goals and treatment plan. Pt verbalized understanding and agreed to treatment plan.    Attendees:  Patient:    Family:    Physician: Dr. Parke Poisson; Dr. Sabra Heck  11/07/2013 9:30 AM   Nursing: Eduard Roux; Marilynne Halsted; Vira Browns , RN  11/07/2013 9:30 AM   Clinical Social Worker: Tilden Fossa, LCSWA  11/07/2013 9:30 AM   Other: Joette Catching, LCSW  11/07/2013 9:30 AM   Other: Lucinda Dell, [place] Liaison  11/07/2013 9:30 AM   Other: Lars Pinks, Case Manager  11/07/2013 9:30 AM   Other: Norberto Sorenson, [place]  11/07/2013 9:30 AM   Other:    Other:    Other:    Other:    Other:         Scribe for Treatment Team:  Tilden Fossa, MSW, [employer]  [phone]

## 2013-11-07 NOTE — Progress Notes (Signed)
D: Patient denies SI/HI and A/V hallucinations;  A: Monitored q 15 minutes; patient encouraged to attend groups; patient educated about medications; patient given medications per physician orders; patient encouraged to express feelings and/or concerns  R: Patient has been in the bed all morning; patient did not attend breakfast and food given to the patient and she did not eat but has been sleeping; patient's interaction with staff and peers is isolative; patient is guarded and cautious; patient was able to set goal to talk with staff 1:1 when having feelings of SI; patient is taking medications as prescribed and tolerating medications; patient is not attending any groups today

## 2013-11-07 NOTE — Progress Notes (Signed)
D: Pt denies SI/HI/AVH. Pt is pleasant and cooperative. Pt stated she was paranoid about what people think about her having kids at a young age.   A: Pt was offered support and encouragement. Pt was given scheduled medications. Pt was encourage to attend groups. Q 15 minute checks were done for safety.   R:Pt attends groups and interacts well with peers and staff. Pt is taking medication .Pt receptive to treatment and safety maintained on unit.

## 2013-11-07 NOTE — [facility] Group Notes (Signed)
[facility] LCSW Group Therapy  Feelings Around Relapse 1:15 -2:30        11/07/2013   Type of Therapy:  Group Therapy  Participation Level:  Appropriate  Participation Quality:  Appropriate  Affect:  Appropriate  Cognitive:  Attentive Appropriate  Insight:  Developing/Improving  Engagement in Therapy: Developing/Improving  Modes of Intervention:  Discussion Exploration Problem-Solving Supportive  Summary of Progress/Problems:  The topic for today was feelings around relapse. Patient processed feelings toward relapse and was able to relate to peers. Patient shared isolation is her way of relapsing.  She talked about having trust issues and finding it difficult to talk with anyone including family.  She advised of feeling she could be open and honest with her therapist but stopped going because therapist would not change medications.  Patient informed therapists do not have the credentials to make changes to medications and have to work with whatever MD prescribed.  She was encouraged to continue working with therapist if she had a good bond with her.   [name] 11/07/2013

## 2013-11-07 NOTE — Progress Notes (Signed)
[facility] Group Notes:  (Nursing/MHT/Case Management/Adjunct)  Date:  11/07/2013  Time:  10:38 PM  Type of Therapy:  Group Therapy  Participation Level:  Did Not Attend  Participation Quality:  Did Not Attend  Affect:  Did Not Attend  Cognitive:  Did Not Attend  Insight:  None  Engagement in Group:  Did Not Attend  Modes of Intervention:  Socialization and Support  Summary of Progress/Problems: Pt. Was resting in room.  Lanell Persons 11/07/2013, 10:38 PM

## 2013-11-07 NOTE — [facility] Group Notes (Signed)
[facility] LCSW Aftercare Discharge Planning Group Note  11/07/2013  8:45 AM  Participation Quality: Did Not Attend. Patient observed sleeping in bed.  Tilden Fossa, MSW, [place] Worker [facility] [phone]

## 2013-11-08 DIAGNOSIS — F323 Major depressive disorder, single episode, severe with psychotic features: Secondary | ICD-10-CM

## 2013-11-08 DIAGNOSIS — F1019 Alcohol abuse with unspecified alcohol-induced disorder: Secondary | ICD-10-CM

## 2013-11-08 DIAGNOSIS — F1210 Cannabis abuse, uncomplicated: Secondary | ICD-10-CM

## 2013-11-08 NOTE — Progress Notes (Signed)
Patient ID: Amy Dougherty, female   DOB: 12-Nov-1995, 18 y.o.   MRN: [id number] [facility] MD Progress Note  11/08/2013 3:46 PM Amy Dougherty  MRN:  [id number]  Subjective: "Amy Dougherty reports, "I feel a little better today. I think my medicines are kicking in"  O:  Patient is visible on the unit and is attending the scheduled groups. She says she is trying to think about my triggers and ways to cope with them. She is up and about within the unit today, partcicpating in groups. She is cooperative and taking her medications ad recommended. She denies any new issues.   Diagnosis: MDD with psychotic features. PTSD by History. Alcohol Abuse, Cannabis Abuse    Total Time spent with patient: 20 minutes   ADL's:  Fair   Sleep: fair  Appetite:  Fair   Suicidal Ideation:  Denies any current suicidal ideations Homicidal Ideation:  Denies any homicidal ideations AEB (as evidenced by):  Psychiatric Specialty Exam: Physical Exam  Review of Systems  Constitutional: Negative for fever and chills.  HENT: Negative.        Describes " cotton mouth"/ dry mouth, possibly related to trazodone .  Respiratory: Negative for cough.   Cardiovascular: Negative for chest pain.  Gastrointestinal: Negative for nausea, vomiting and abdominal pain.  Genitourinary: Negative for dysuria and urgency.  Skin: Negative for rash.  Psychiatric/Behavioral: Positive for depression and substance abuse.    Blood pressure 122/66, pulse 66, temperature 97.6 F (36.4 C), temperature source Oral, resp. rate 18, height $RemoveBe'5\' 6"'NgFXOSMEB$  (1.676 m), weight 69.854 kg (154 lb), last menstrual period 10/13/2013, not currently breastfeeding.Body mass index is 24.87 kg/(m^2).  General Appearance: Fairly Groomed  Engineer, water::  Good- overall better related than yesterday   Speech:  Slow  Volume:  Normal  Mood:  Depressed and but improved compared to initial presentation, affect more reactive   Affect:  more reactive , smiles at times appropriately   Thought Process:  Linear  Orientation:  Other:  fully alert and attentive   Thought Content:  at this time denies hallucinations and does not appear internally preoccupied at present. No delusions expressed today, seems less guarded .  Suicidal Thoughts:  No- at present denies any suicidal or homicidal ideations  Homicidal Thoughts:  No  Memory:  recent and remote grossly intact   Judgement:  Fair  Insight:  Fair  Psychomotor Activity:  Normal  Concentration:  Good  Recall:  Good  Fund of Knowledge:Good  Language: Good  Akathisia:  Negative  Handed:  Right  AIMS (if indicated):     Assets:  Desire for Improvement Physical Health Resilience  Sleep:  Number of Hours: 6.5   Musculoskeletal: Strength & Muscle Tone: within normal limits Gait & Station: normal Patient leans: N/A  Current Medications: Current Facility-Administered Medications  Medication Dose Route Frequency Provider Last Rate Last Dose  . acetaminophen (TYLENOL) tablet 650 mg  650 mg Oral Q6H PRN Laverle Hobby, PA-C      . alum & mag hydroxide-simeth (MAALOX/MYLANTA) 200-200-20 MG/5ML suspension 30 mL  30 mL Oral Q4H PRN Laverle Hobby, PA-C      . chlordiazePOXIDE (LIBRIUM) capsule 25 mg  25 mg Oral QID PRN Neita Garnet, MD      . clindamycin (CLEOCIN) capsule 300 mg  300 mg Oral Q12H Maurine Minister Simon, PA-C   300 mg at 11/08/13 1105  . doxycycline (VIBRA-TABS) tablet 100 mg  100 mg Oral Q12H Laverle Hobby, PA-C  100 mg at 11/08/13 1104  . feeding supplement (ENSURE COMPLETE) (ENSURE COMPLETE) liquid 237 mL  237 mL Oral BID BM Toribio Harbour, RD   237 mL at 11/06/13 1600  . hydrOXYzine (ATARAX/VISTARIL) tablet 25 mg  25 mg Oral Q6H PRN Laverle Hobby, PA-C   25 mg at 11/07/13 2332  . magnesium hydroxide (MILK OF MAGNESIA) suspension 30 mL  30 mL Oral Daily PRN Laverle Hobby, PA-C      . risperiDONE (RISPERDAL) tablet 2 mg  2 mg Oral QHS Neita Garnet, MD   2 mg at 11/07/13 2148  . sertraline (ZOLOFT)  tablet 50 mg  50 mg Oral Daily Neita Garnet, MD   50 mg at 11/08/13 1105  . traZODone (DESYREL) tablet 50 mg  50 mg Oral QHS,MR X 1 Laverle Hobby, PA-C   50 mg at 11/07/13 2331  . valACYclovir (VALTREX) tablet 500 mg  500 mg Oral Daily Laverle Hobby, PA-C   500 mg at 11/08/13 1104    Lab Results:  Results for orders placed during the hospital encounter of 11/05/13 (from the past 48 hour(s))  CBC     Status: Abnormal   Collection Time    11/06/13  8:15 PM      Result Value Ref Range   WBC 7.9  4.0 - 10.5 K/uL   RBC 4.61  3.87 - 5.11 MIL/uL   Hemoglobin 11.4 (*) 12.0 - 15.0 g/dL   HCT 35.5 (*) 36.0 - 46.0 %   MCV 77.0 (*) 78.0 - 100.0 fL   MCH 24.7 (*) 26.0 - 34.0 pg   MCHC 32.1  30.0 - 36.0 g/dL   RDW 14.9  11.5 - 15.5 %   Platelets 335  150 - 400 K/uL   Comment: Performed at [facility] PANEL     Status: Abnormal   Collection Time    11/06/13  8:15 PM      Result Value Ref Range   Sodium 138  137 - 147 mEq/L   Potassium 4.5  3.7 - 5.3 mEq/L   Chloride 102  96 - 112 mEq/L   CO2 21  19 - 32 mEq/L   Glucose, Bld 113 (*) 70 - 99 mg/dL   BUN 14  6 - 23 mg/dL   Creatinine, Ser 0.68  0.50 - 1.10 mg/dL   Calcium 9.3  8.4 - 10.5 mg/dL   Total Protein 8.1  6.0 - 8.3 g/dL   Albumin 4.0  3.5 - 5.2 g/dL   AST 17  0 - 37 U/L   ALT 14  0 - 35 U/L   Alkaline Phosphatase 72  39 - 117 U/L   Total Bilirubin <0.2 (*) 0.3 - 1.2 mg/dL   GFR calc non Af Amer >90  >90 mL/min   GFR calc Af Amer >90  >90 mL/min   Comment: (NOTE)     The eGFR has been calculated using the CKD EPI equation.     This calculation has not been validated in all clinical situations.     eGFR's persistently <90 mL/min signify possible Chronic Kidney     Disease.   Anion gap 15  5 - 15   Comment: Performed at [facility]  LIPID PANEL     Status: None   Collection Time    11/06/13  8:15 PM      Result Value Ref Range   Cholesterol 182  0 - 200 mg/dL  Triglycerides 120  <150 mg/dL   HDL 78  >39 mg/dL   Total CHOL/HDL Ratio 2.3     VLDL 24  0 - 40 mg/dL   LDL Cholesterol 80  0 - 99 mg/dL   Comment:            Total Cholesterol/HDL:CHD Risk     Coronary Heart Disease Risk Table                         Men   Women      1/2 Average Risk   3.4   3.3      Average Risk       5.0   4.4      2 X Average Risk   9.6   7.1      3 X Average Risk  23.4   11.0                Use the calculated Patient Ratio     above and the CHD Risk Table     to determine the patient's CHD Risk.                ATP III CLASSIFICATION (LDL):      <100     mg/dL   Optimal      100-129  mg/dL   Near or Above                        Optimal      130-159  mg/dL   Borderline      160-189  mg/dL   High      >190     mg/dL   Very High     Performed at [facility]  TSH     Status: None   Collection Time    11/06/13  8:15 PM      Result Value Ref Range   TSH 0.650  0.350 - 4.500 uIU/mL   Comment: Performed at [place], DIRECT     Status: None   Collection Time    11/06/13  8:15 PM      Result Value Ref Range   Bilirubin, Direct <0.2  0.0 - 0.3 mg/dL   Comment: Performed at [facility]  HIV ANTIBODY (ROUTINE TESTING)     Status: None   Collection Time    11/07/13  6:30 AM      Result Value Ref Range   HIV 1&2 Ab, 4th Generation NONREACTIVE  NONREACTIVE   Comment: (NOTE)     A NONREACTIVE HIV Ag/Ab result does not exclude HIV infection since     the time frame for seroconversion is variable. If acute HIV infection     is suspected, a HIV-1 RNA Qualitative TMA test is recommended.     HIV-1/2 Antibody Diff         Not indicated.     HIV-1 RNA, Qual TMA           Not indicated.     PLEASE NOTE: This information has been disclosed to you from records     whose confidentiality may be protected by state law. If your state     requires such protection, then the state law prohibits you from making     any  further disclosure of the information without the specific written     consent of the person to whom it pertains,  or as otherwise permitted     by law. A general authorization for the release of medical or other     information is NOT sufficient for this purpose.     The performance of this assay has not been clinically validated in     patients less than 43 years old.     Performed at [facility]  HEPATITIS C ANTIBODY     Status: None   Collection Time    11/07/13  6:30 AM      Result Value Ref Range   HCV Ab NEGATIVE  NEGATIVE   Comment: (NOTE)     Effective December 22, 2013, Hepatitis C Antibody (test code 03500)     will be revised to automatically reflex to the Hepatitis C Viral RNA,     Quantitative, Real-Time PCR assay if the antibody screening result is     Reactive. This action is being taken to ensure that the CDC/USPSTF     recommended HCV diagnostic algorithm with the appropriate test reflex     needed for accurate interpretation is followed.     As of December 22, 2013 the name of the test code 93818 will be     Hepatitis C Antibody with Reflex to HCV RNA, Quantitative, Real-Time     PCR. This change will also be reflected in standard and custom     profiles that currently include test code 29937.     Performed at [facility]  HEPATITIS B SURFACE ANTIGEN     Status: None   Collection Time    11/07/13  6:30 AM      Result Value Ref Range   Hepatitis B Surface Ag NEGATIVE  NEGATIVE   Comment: Performed at [facility]  CBC WITH DIFFERENTIAL     Status: Abnormal   Collection Time    11/07/13  6:30 AM      Result Value Ref Range   WBC 6.7  4.0 - 10.5 K/uL   RBC 4.34  3.87 - 5.11 MIL/uL   Hemoglobin 10.8 (*) 12.0 - 15.0 g/dL   HCT 16.9 (*) 67.8 - 93.8 %   MCV 78.1  78.0 - 100.0 fL   MCH 24.9 (*) 26.0 - 34.0 pg   MCHC 31.9  30.0 - 36.0 g/dL   RDW 10.1  75.1 - 02.5 %   Platelets 275  150 - 400 K/uL   Neutrophils Relative % 48  43 - 77 %    Neutro Abs 3.2  1.7 - 7.7 K/uL   Lymphocytes Relative 41  12 - 46 %   Lymphs Abs 2.8  0.7 - 4.0 K/uL   Monocytes Relative 8  3 - 12 %   Monocytes Absolute 0.5  0.1 - 1.0 K/uL   Eosinophils Relative 3  0 - 5 %   Eosinophils Absolute 0.2  0.0 - 0.7 K/uL   Basophils Relative 0  0 - 1 %   Basophils Absolute 0.0  0.0 - 0.1 K/uL   Comment: Performed at [facility]  BASIC METABOLIC PANEL     Status: Abnormal   Collection Time    11/07/13  6:30 AM      Result Value Ref Range   Sodium 134 (*) 137 - 147 mEq/L   Potassium 4.1  3.7 - 5.3 mEq/L   Chloride 100  96 - 112 mEq/L   CO2 23  19 - 32 mEq/L   Glucose, Bld 84  70 - 99  mg/dL   BUN 11  6 - 23 mg/dL   Creatinine, Ser 0.55  0.50 - 1.10 mg/dL   Calcium 8.8  8.4 - 10.5 mg/dL   GFR calc non Af Amer >90  >90 mL/min   GFR calc Af Amer >90  >90 mL/min   Comment: (NOTE)     The eGFR has been calculated using the CKD EPI equation.     This calculation has not been validated in all clinical situations.     eGFR's persistently <90 mL/min signify possible Chronic Kidney     Disease.   Anion gap 11  5 - 15   Comment: Performed at [facility]  LIPID PANEL     Status: None   Collection Time    11/07/13  6:30 AM      Result Value Ref Range   Cholesterol 167  0 - 200 mg/dL   Triglycerides 68  <150 mg/dL   HDL 86  >39 mg/dL   Total CHOL/HDL Ratio 1.9     VLDL 14  0 - 40 mg/dL   LDL Cholesterol 67  0 - 99 mg/dL   Comment:            Total Cholesterol/HDL:CHD Risk     Coronary Heart Disease Risk Table                         Men   Women      1/2 Average Risk   3.4   3.3      Average Risk       5.0   4.4      2 X Average Risk   9.6   7.1      3 X Average Risk  23.4   11.0                Use the calculated Patient Ratio     above and the CHD Risk Table     to determine the patient's CHD Risk.                ATP III CLASSIFICATION (LDL):      <100     mg/dL   Optimal      100-129  mg/dL   Near or Above                         Optimal      130-159  mg/dL   Borderline      160-189  mg/dL   High      >190     mg/dL   Very High     Performed at [facility]    Physical Findings: AIMS: Facial and Oral Movements Muscles of Facial Expression: None, normal Lips and Perioral Area: None, normal Jaw: None, normal Tongue: None, normal,Extremity Movements Upper (arms, wrists, hands, fingers): None, normal Lower (legs, knees, ankles, toes): None, normal, Trunk Movements Neck, shoulders, hips: None, normal, Overall Severity Severity of abnormal movements (highest score from questions above): None, normal Incapacitation due to abnormal movements: None, normal Patient's awareness of abnormal movements (rate only patient's report): No Awareness, Dental Status Current problems with teeth and/or dentures?: No Does patient usually wear dentures?: No  CIWA:  CIWA-Ar Total: 0 COWS:     Assessment: Today somewhat improved compared to admission. Seems less guarded, less suspicious, and at this time not exhibiting any overt delusions or psychotic ideations. She  Is presenting with a slightly improved range of affect. Denies medication side effects except for dry mouth and AM sedation from Trazodone   Treatment Plan Summary: Daily contact with patient to assess and evaluate symptoms and progress in treatment Medication management See below   Plan: Continue inpatient treatment and support Continue Librium PRNs potential ETOH withdrawal Continue Zoloft 50 mgrs Q daily for depression Continue Risperidone 2 mgrs QHS for mood control. Decrease Trazodone to 25 mgrs QHS PRN Insomnia to minimize AM sedation  Medical Decision Making Problem Points:  Established problem, stable/improving (1), Review of last therapy session (1) and Review of psycho-social stressors (1) Data Points:  Review or order clinical lab tests (1) Review of medication regiment & side effects (2)  I certify that inpatient services  furnished can reasonably be expected to improve the patient's condition.   Lindell Spar I, [place] 11/08/2013, 3:46 PM  Patient seen, evaluated and I agree with notes by Nurse Practitioner. Corena Pilgrim, MD

## 2013-11-08 NOTE — Progress Notes (Signed)
Patient ID: Amy Dougherty, female   DOB: 08-Dec-1995, 18 y.o.   MRN: [id number]   D: Pt has been very flat and depressed on the unit today, she was very isolative. Pt was in the bed most of the day. Pt did not attend any groups, nor did she engage in treatment. Pt reported being negative SI/HI, no AH/VH noted. A: 15 min checks continued for patient safety. R: Pt safety maintained.

## 2013-11-08 NOTE — Progress Notes (Signed)
D: Pt denies SI/HI/AVH. Pt is pleasant and cooperative. Pt stated she felt better today, she got to see her kids.   A: Pt was offered support and encouragement. Pt was given scheduled medications. Pt was encourage to attend groups. Q 15 minute checks were done for safety.   R:f Pt is taking medication. Pt has no complaints at this time.Pt receptive to treatment and safety maintained on unit.

## 2013-11-08 NOTE — BHH Group Notes (Signed)
[place] Group Notes:  (Nursing/MHT/Case Management/Adjunct)  Date:  11/08/2013  Time:  4:15 PM  Type of Therapy: Psychoeducational Skills- Healthy Coping Skills   Participation Level: Did Not Attend   Ventura Sellers 11/08/2013, 4:15 PM

## 2013-11-08 NOTE — BHH Group Notes (Signed)
[place] Group Notes: (Clinical Social Work)   11/08/2013      Type of Therapy:  Group Therapy   Participation Level:  Did Not Attend - came in with only 5 minutes left in group, declined prior to that   [employer], [place] 11/08/2013, 12:31 PM

## 2013-11-08 NOTE — Progress Notes (Signed)
[place] Group Notes:  (Nursing/MHT/Case Management/Adjunct)  Date:  11/08/2013  Time:  11:03 PM  Type of Therapy:  AA  Participation Level:  Did Not Attend  Participation Quality:  Did Not Attend  Affect:  Did Not Attend  Cognitive:  Did Not Attend  Insight:  None  Engagement in Group:  Did Not Attend  Modes of Intervention:  Discussion and Education  Summary of Progress/Problems: Pt. Did not Attend group.  Lanell Persons 11/08/2013, 11:03 PM

## 2013-11-08 NOTE — Progress Notes (Signed)
D: Pt denies SI/HI/AVH. Pt is pleasant and cooperative. Pt concerned about what her future holds. Pt family successful and have high expectations of pt , and she is nervous that she is not on track with what her family members believe she should be doing at this age. Pt wants to be there for her children, but understands she needs to get herself right first.   A: Pt was offered support and encouragement. Pt was given scheduled medications. Pt was encourage to attend groups. Q 15 minute checks were done for safety.   R: Pt is taking medication. Pt has no complaints at this time.Pt receptive to treatment and safety maintained on unit.

## 2013-11-09 MED ORDER — RISPERIDONE 0.5 MG PO TABS: 0.5000 mg | ORAL_TABLET | Freq: Two times a day (BID) | ORAL | Status: DC

## 2013-11-09 MED ORDER — RISPERIDONE 0.5 MG PO TABS
0.5000 mg | ORAL_TABLET | Freq: Two times a day (BID) | ORAL | Status: DC
  Administered 2013-11-09 – 2013-11-11 (×4): 0.5 mg via ORAL
  Filled 2013-11-09 (×9): qty 1

## 2013-11-09 MED ORDER — TRAZODONE HCL 100 MG PO TABS
100.0000 mg | ORAL_TABLET | Freq: Every evening | ORAL | Status: DC | PRN
  Filled 2013-11-09 (×4): qty 1

## 2013-11-09 NOTE — BHH Group Notes (Signed)
[place] Group Notes:  (Nursing/MHT/Case Management/Adjunct)  Date:  11/09/2013  Time:  6:01 PM  Type of Therapy:  Nurse Education  Participation Level:  Minimal  Participation Quality:  Appropriate and Sharing  Affect:  Anxious  Cognitive:  Alert  Insight:  Appropriate  Engagement in Group:  Poor  Modes of Intervention:  Activity, Discussion and Education  Summary of Progress/Problems: pt's interaction was minimal she was cautious in her presentation.  She did identify her grandparents and therapist as her support.   Karel Jarvis 11/09/2013, 6:01 PM

## 2013-11-09 NOTE — Progress Notes (Signed)
Patient did not attend the evening speaker AA meeting. Pt was notified that group was beginning but remained in her room.   

## 2013-11-09 NOTE — Plan of Care (Signed)
Problem: BHH Concurrent Medical Problem Goal: STG-Compliance with medication and/or treatment as ordered (STG-Compliance with medication and/or treatment as ordered by MD)  Outcome: Progressing Pt taking medications as prescribed  Problem: Alteration in mood Goal: LTG-Patient reports reduction in suicidal thoughts (Patient reports reduction in suicidal thoughts and is able to verbalize a safety plan for whenever patient is feeling suicidal)  Outcome: Progressing Pt denies SI  Problem: Ineffective individual coping Goal: STG: Patient will remain free from self harm Outcome: Progressing Pt safe on the unit

## 2013-11-09 NOTE — Progress Notes (Signed)
Patient ID: Amy Dougherty, female   DOB: Feb 04, 1996, 18 y.o.   MRN: [id number] Patient ID: Amy Dougherty, female   DOB: 1995/06/27, 18 y.o.   MRN: [id number] [facility] MD Progress Note  11/09/2013 1:33 PM [name]  MRN:  [id number]  Subjective: "Justene reports, "my depression comes and goes. I'm feeling more paranoia to be around other folks. I'm trying very hard to participate in groups. I'm not sleeping well at night. I need my Trazodone increased "  O:  Patient is visible on the unit and is attending the scheduled groups. She says she is trying to think about my triggers and ways to cope with them. She is up and about within the unit today, partcicpating in groups. She is cooperative and taking her medications ad recommended. She denies any new issues.   Diagnosis: MDD with psychotic features. PTSD by History. Alcohol Abuse, Cannabis Abuse    Total Time spent with patient: 20 minutes   ADL's:  Fair   Sleep: fair  Appetite:  Fair   Suicidal Ideation:  Denies any current suicidal ideations Homicidal Ideation:  Denies any homicidal ideations AEB (as evidenced by):  Psychiatric Specialty Exam: Physical Exam  Review of Systems  Constitutional: Negative for fever and chills.  HENT: Negative.        Describes " cotton mouth"/ dry mouth, possibly related to trazodone .  Respiratory: Negative for cough.   Cardiovascular: Negative for chest pain.  Gastrointestinal: Negative for nausea, vomiting and abdominal pain.  Genitourinary: Negative for dysuria and urgency.  Skin: Negative for rash.  Psychiatric/Behavioral: Positive for depression and substance abuse.    Blood pressure 96/69, pulse 82, temperature 98.5 F (36.9 C), temperature source Oral, resp. rate 18, height 5\' 6"  (1.676 m), weight 69.854 kg (154 lb), last menstrual period 10/13/2013, not currently breastfeeding.Body mass index is 24.87 kg/(m^2).  General Appearance: Fairly Groomed  Engineer, water::  Good- overall better  related than yesterday   Speech:  Slow  Volume:  Normal  Mood:  Depressed and but improved compared to initial presentation, affect more reactive   Affect:  more reactive , smiles at times appropriately  Thought Process:  Linear  Orientation:  Other:  fully alert and attentive   Thought Content:  at this time denies hallucinations and does not appear internally preoccupied at present. No delusions expressed today, seems less guarded .  Suicidal Thoughts:  No- at present denies any suicidal or homicidal ideations  Homicidal Thoughts:  No  Memory:  recent and remote grossly intact   Judgement:  Fair  Insight:  Fair  Psychomotor Activity:  Normal  Concentration:  Good  Recall:  Good  Fund of Knowledge:Good  Language: Good  Akathisia:  Negative  Handed:  Right  AIMS (if indicated):     Assets:  Desire for Improvement Physical Health Resilience  Sleep:  Number of Hours: 4.75   Musculoskeletal: Strength & Muscle Tone: within normal limits Gait & Station: normal Patient leans: N/A  Current Medications: Current Facility-Administered Medications  Medication Dose Route Frequency Provider Last Rate Last Dose  . acetaminophen (TYLENOL) tablet 650 mg  650 mg Oral Q6H PRN Laverle Hobby, PA-C   650 mg at 11/09/13 1119  . alum & mag hydroxide-simeth (MAALOX/MYLANTA) 200-200-20 MG/5ML suspension 30 mL  30 mL Oral Q4H PRN Laverle Hobby, PA-C      . chlordiazePOXIDE (LIBRIUM) capsule 25 mg  25 mg Oral QID PRN Neita Garnet, MD      .  clindamycin (CLEOCIN) capsule 300 mg  300 mg Oral Q12H Laverle Hobby, PA-C   300 mg at 11/09/13 0748  . doxycycline (VIBRA-TABS) tablet 100 mg  100 mg Oral Q12H Laverle Hobby, PA-C   100 mg at 11/09/13 0748  . feeding supplement (ENSURE COMPLETE) (ENSURE COMPLETE) liquid 237 mL  237 mL Oral BID BM Toribio Harbour, RD   237 mL at 11/06/13 1600  . hydrOXYzine (ATARAX/VISTARIL) tablet 25 mg  25 mg Oral Q6H PRN Laverle Hobby, PA-C   25 mg at 11/07/13 2332   . magnesium hydroxide (MILK OF MAGNESIA) suspension 30 mL  30 mL Oral Daily PRN Laverle Hobby, PA-C      . risperiDONE (RISPERDAL) tablet 0.5 mg  0.5 mg Oral BID Encarnacion Slates, NP      . risperiDONE (RISPERDAL) tablet 2 mg  2 mg Oral QHS Neita Garnet, MD   2 mg at 11/08/13 2040  . sertraline (ZOLOFT) tablet 50 mg  50 mg Oral Daily Neita Garnet, MD   50 mg at 11/09/13 0747  . traZODone (DESYREL) tablet 100 mg  100 mg Oral QHS,MR X 1 Encarnacion Slates, NP      . valACYclovir (VALTREX) tablet 500 mg  500 mg Oral Daily Laverle Hobby, PA-C   500 mg at 11/09/13 1027    Lab Results:  No results found for this or any previous visit (from the past 48 hour(s)).  Physical Findings: AIMS: Facial and Oral Movements Muscles of Facial Expression: None, normal Lips and Perioral Area: None, normal Jaw: None, normal Tongue: None, normal,Extremity Movements Upper (arms, wrists, hands, fingers): None, normal Lower (legs, knees, ankles, toes): None, normal, Trunk Movements Neck, shoulders, hips: None, normal, Overall Severity Severity of abnormal movements (highest score from questions above): None, normal Incapacitation due to abnormal movements: None, normal Patient's awareness of abnormal movements (rate only patient's report): No Awareness, Dental Status Current problems with teeth and/or dentures?: No Does patient usually wear dentures?: No  CIWA:  CIWA-Ar Total: 0 COWS:     Treatment Plan Summary: Daily contact with patient to assess and evaluate symptoms and progress in treatment Medication management See below   Plan: 1. Continue crisis management, mood stabilization & relapse prevention.. 2. Continue current medication management to reduce current symptoms to base line and improve the  patient's overall level of functioning; Continue Risperdal for mood control, 0.5 mg bid for bid for paranoia, Sertraline 50 mg for depression, increase Trazodone to 100 mg for insomnia. 3. Treat health  problems as indicated - Continue Clindamycin 300 mg for infection and valtrex 500 mg for Herpes infection.  4. Develop treatment plan to decrease risk of relapse upon discharge and the need for  readmission. 5. Psycho-social education regarding relapse prevention and self care.  Medical Decision Making Problem Points:  Established problem, stable/improving (1), Review of last therapy session (1) and Review of psycho-social stressors (1) Data Points:  Review or order clinical lab tests (1) Review of medication regiment & side effects (2)  I certify that inpatient services furnished can reasonably be expected to improve the patient's condition.   Lindell Spar I, [place] 11/09/2013, 1:33 PM   Patient seen, evaluated and I agree with notes by Nurse Practitioner. Corena Pilgrim, MD

## 2013-11-09 NOTE — [facility] Group Notes (Signed)
[facility] Group Notes:  (Nursing/MHT/Case Management/Adjunct)  Date:  11/09/2013  Time:  3:55 PM  Type of Therapy:  Psychoeducational Skills  Participation Level:  Active  Participation Quality:  Appropriate  Affect:  Appropriate  Cognitive:  Appropriate  Insight:  Appropriate  Engagement in Group:  Engaged  Modes of Intervention:  Discussion  Summary of Progress/Problems: Pt did attend self inventory group, pt reported that she was negative SI/HI, no AH/VH noted. Pt rated her depression as a 4.5, and her helplessness/hopelessness as a 0.     Pt reported no issues or concerns.   Benancio Deeds Shanta 11/09/2013, 3:55 PM

## 2013-11-09 NOTE — Progress Notes (Signed)
Patient ID: Amy Dougherty, female   DOB: 11/03/1995, 18 y.o.   MRN: [id number]   D: Pt has been very flat and depressed on the unit. Pt did attend all groups and attempted to engage in treatment, however has no insight. Pt reported being negative SI/HI, no AH/VH noted. A: 15 min checks continued for patient safety. R: Pt safety maintained.

## 2013-11-09 NOTE — [facility] Group Notes (Signed)
[facility] Group Notes:  (Clinical Social Work)  11/09/2013  10:00-11:00AM  Summary of Progress/Problems:   The main focus of today's process group was to   1)  discuss the importance of adding supports  2)  define health supports versus unhealthy supports  3)  identify the patient's current unhealthy supports and plan how to handle them  4)  Identify the patient's current healthy supports and plan what to add.  An emphasis was placed on using counselor, doctor, therapy groups, 12-step groups, and problem-specific support groups to expand supports.    The patient expressed full comprehension of the concepts presented, and agreed that there is a need to add more supports.  The patient stated she had a therapist in the past, and did not really benefit greatly from it.  She expressed a willingness to add therapy again in a new effort to make that helpful.  She stated her current significant supports are her grandparents who take care of her children after school, and who are caring for her children while she is in the hospital.  Type of Therapy:  Process Group with Motivational Interviewing  Participation Level:  Active  Participation Quality:  Attentive  Affect:  Blunted  Cognitive:  Oriented  Insight:  Engaged  Engagement in Therapy:  Engaged  Modes of Intervention:   Education, Support and Processing, Activity  [employer], LCSW 11/09/2013, 12:15pm

## 2013-11-10 MED ORDER — SERTRALINE HCL 100 MG PO TABS
100.0000 mg | ORAL_TABLET | Freq: Every day | ORAL | Status: DC
  Administered 2013-11-11: 100 mg via ORAL
  Filled 2013-11-10 (×2): qty 1

## 2013-11-10 NOTE — Progress Notes (Signed)
D: Pt denies SI/HI/AVH. Pt is pleasant and cooperative. Pt continues to have flat, blunted affect. Pt wants to talk to Dr about D/C.  A: Pt was offered support and encouragement. Pt was given scheduled medications. Pt was encourage to attend groups. Q 15 minute checks were done for safety.   R: Pt is taking medication. Pt has no complaints.Pt receptive to treatment and safety maintained on unit.

## 2013-11-10 NOTE — Progress Notes (Signed)
Patient ID: Amy Dougherty, female   DOB: 1995/11/28, 18 y.o.   MRN: [id number] She has been up for short periods of time then back to be. She requested and received mediation for anxiety today and went to sleep this afternoon up around 5 PM and requested tylenol for menstrual cramps. Stated she always sleeps a lot during her cycle.  She is brighter talking louder this PM. Her self inventory this AM:   Depression 4, hopelessness 3, poor appetite. Energy normal, denies SI thoughts and pain but put she had stomach cramps.  She did not put a goal for today.

## 2013-11-10 NOTE — Progress Notes (Signed)
Adult Psychoeducational Group Note  Date:  11/10/2013 Time: 10:00am Group Topic/Focus:  therapeutic Activity  Participation Level:  Did Not Attend  Participation Quality:    Affect:    Cognitive:    Insight:   Engagement in Group:    Modes of Intervention:    Additional Comments:  Pt did not attend group.  Marlowe Shores D 11/10/2013, 11:02 AM

## 2013-11-10 NOTE — Progress Notes (Signed)
D: Pt denies SI/HI/AVH. Pt is pleasant and cooperative. Pt got to see her children tonight. Pt liked the Librium to help her anxiety.    A: Pt was offered support and encouragement. Pt was given scheduled medications. Pt was encourage to attend groups. Q 15 minute checks were done for safety.   R:Pt attends groups and interacts well with peers and staff. Pt is taking medication. Pt has no complaints at this time .Pt receptive to treatment and safety maintained on unit.
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
## 2013-11-10 NOTE — [facility] Group Notes (Signed)
[facility] LCSW Group Therapy 11/10/2013  1:15 PM   Type of Therapy: Group Therapy  Participation Level: Did Not Attend for unknown reason.    Tilden Fossa, MSW, [place] Worker [facility] [phone]

## 2013-11-10 NOTE — [facility] Group Notes (Signed)
   [facility] LCSW Aftercare Discharge Planning Group Note  11/10/2013  8:45 AM   Participation Quality: Alert, Appropriate and Oriented  Mood/Affect: Depressed and Flat  Depression Rating: 5  Anxiety Rating: 7  Thoughts of Suicide: Pt denies SI/HI  Will you contract for safety? Yes  Current AVH: Pt denies  Plan for Discharge/Comments: Pt attended discharge planning group and actively participated in group. CSW provided pt with today's workbook. Patient reports feeling "sleep and anxious" this morning although she did not feel comfortable discussing why she was anxious with group. She appeared depressed and with flat affect. She plans to return home and follow up with [facility] for outpatient services.  Transportation Means: Pt reports access to transportation  Supports: No supports mentioned at this time  Tilden Fossa, MSW, [place] Social Worker [employer] [phone]

## 2013-11-11 MED ORDER — VALACYCLOVIR HCL 500 MG PO TABS: 500.0000 mg | ORAL_TABLET | Freq: Every day | ORAL | Status: DC

## 2013-11-11 MED ORDER — SERTRALINE HCL 100 MG PO TABS: 100.0000 mg | ORAL_TABLET | Freq: Every day | ORAL | Status: DC

## 2013-11-11 MED ORDER — DOXYCYCLINE HYCLATE 100 MG PO TABS: 100.0000 mg | ORAL_TABLET | Freq: Two times a day (BID) | ORAL | Status: DC

## 2013-11-11 MED ORDER — CLINDAMYCIN HCL 300 MG PO CAPS: 300.0000 mg | ORAL_CAPSULE | Freq: Two times a day (BID) | ORAL | Status: DC

## 2013-11-11 MED ORDER — RISPERIDONE 2 MG PO TABS: 2.0000 mg | ORAL_TABLET | Freq: Every day | ORAL | Status: DC

## 2013-11-11 MED ORDER — RISPERIDONE 0.5 MG PO TABS: 0.5000 mg | ORAL_TABLET | Freq: Two times a day (BID) | ORAL | Status: DC
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
## 2013-11-11 NOTE — [facility] Suicide Risk Assessment (Addendum)
Demographic Factors:  18 year old female, lives alone, employed, has  two children, currently living with patient's grandparents   Total Time spent with patient: 30 minutes  Psychiatric Specialty Exam: Physical Exam  ROS  Blood pressure 121/50, pulse 78, temperature 97.9 F (36.6 C), temperature source Oral, resp. rate 16, height 5\' 6"  (1.676 m), weight 69.854 kg (154 lb), last menstrual period 10/13/2013, not currently breastfeeding.Body mass index is 24.87 kg/(m^2).  General Appearance: Well Groomed  Engineer, water::  Good  Speech:  Normal Rate  Volume:  Normal  Mood:  improved, with a much inmproved range of affect- smiles often and appropriately, no longer appears guarded/suspicious  Affect:  Appropriate and reactive   Thought Process:  Goal Directed and Linear  Orientation:  Other:  fully alert and attentive   Thought Content:  no hallucinations, no delusions - at this time no paranoid ideations or self referential ideations expressed. Does not appear internally preoccupied.   Suicidal Thoughts:  No- at present denies any suicidal or homicidal ideations  Homicidal Thoughts:  No  Memory:  recent and remote grossly intact   Judgement:  Fair  Insight:  Fair  Psychomotor Activity:  Normal  Concentration:  Good  Recall:  Good  Fund of Knowledge:Good  Language: Good  Akathisia:  Negative  Handed:  Right  AIMS (if indicated):     Assets:  Desire for Improvement Physical Health Resilience  Sleep:  Number of Hours: 6    Musculoskeletal: Strength & Muscle Tone: within normal limits Gait & Station: normal Patient leans: N/A   Mental Status Per Nursing Assessment::   On Admission:  NA  Current Mental Status by Physician: At this time patient significantly improved compared to admission. She is better related, less guarded, more communicative, with a brighter affect, and describes improved mood. There is no current thought disorder, denies hallucinations, does not express  delusions , she is not suicidal and not homicidal .   Loss Factors: States she was diagnosed with STD several months ago. Limited support network.   Historical Factors: Previous suicide gesture when she was 18 years old. History of victimization in childhood and odes report PTSD symptoms.   Risk Reduction Factors:   Responsible for children under 70 years of age, Sense of responsibility to family, Employed and Positive coping skills or problem solving skills  Continued Clinical Symptoms:  As noted above, mood and affect currently improved, and no current overt psychotic symptoms or paranoia. No SI or HI.  Cognitive Features That Contribute To Risk:  Alert and attentive, oriented x 3. No gross cognitive deficits noted   Suicide Risk:  Mild:  Suicidal ideation of limited frequency, intensity, duration, and specificity.  There are no identifiable plans, no associated intent, mild dysphoria and related symptoms, good self-control (both objective and subjective assessment), few other risk factors, and identifiable protective factors, including available and accessible social support.  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 PTSD by History. Alcohol Abuse, Cannabis Abuse   AXIS II:  Deferred AXIS III:   Past Medical History  Diagnosis Date  . History of blood clots     Blood clot in  Right overy after delivery  . Wisdom teeth extracted   . Herpes genitalis in women     currently taking medications   AXIS IV:  limited support network, STD ( Herpes) diagnosed several weeks ago AXIS V:  660  Plan Of Care/Follow-up recommendations:  Activity:  As  tolerated Diet:  Regular Tests:  NA Other:  See below  Is patient on multiple antipsychotic therapies at discharge:  No   Has Patient had three or more failed trials of antipsychotic monotherapy by history:  No  Recommended Plan for Multiple Antipsychotic Therapies: NA  Patient plans to  return home. She is planning to follow up with  [facility] We discussed the importance of avoiding cannabis and alcohol, which patient now states likely contributed to her decompensation. I have encouraged her to attend AA or NA regularly.  We reviewed medication side effects prior to discharge, to include risk of metabolic disturbances and movement disorder on Risperidone, and potential increase in suicidal ideations or behaviors in young adults  On early  antidepressant management. Patient agrees to return to ER if needed .  Amy Dougherty 11/11/2013, 11:16 AM

## 2013-11-11 NOTE — Progress Notes (Signed)
Pt was discharged home today. She denied any S/I H/I or A/V hallucinations.  She was given f/u appointment, rx, sample medications, and hotline info booklet. She voiced understanding to all instructions provided.  She declined the need for smoking cessation materials.

## 2013-11-11 NOTE — Discharge Instructions (Signed)
Adjustment Disorder Most changes in life can cause stress. Getting used to changes may take a few months or longer. If feelings of stress, hopelessness, or worry continue, you may have an adjustment disorder. This stress-related mental health problem may affect your feelings, thinking and how you act. It occurs in both sexes and happens at any age. SYMPTOMS  Some of the following problems may be seen and vary from person to person:  Sadness or depression.  Loss of enjoyment.  Thoughts of suicide.  Fighting.  Avoiding family and friends.  Poor school performance.  Hopelessness, sense of loss.  Trouble sleeping.  Vandalism.  Worry, weight loss or gain.  Crying spells.  Anxiety  Reckless driving.  Skipping school.  Poor work Systems analyst.  Nervousness.  Ignoring bills.  Poor attitude. DIAGNOSIS  Your caregiver will ask what has happened in your life and do a physical exam. They will make a diagnosis of an adjustment disorder when they are sure another problem or medical illness causing your feelings does not exist. TREATMENT  When problems caused by stress interfere with you daily life or last longer than a few months, you may need counseling for an adjustment disorder. Early treatment may diminish problems and help you to better cope with the stressful events in your life. Sometimes medication is necessary. Individual counseling and or support groups can be very helpful. PROGNOSIS  Adjustment disorders usually last less than 3 to 6 months. The condition may persist if there is long lasting stress. This could include health problems, relationship problems, or job difficulties where you can not easily escape from what is causing the problem. PREVENTION  Even the most mentally healthy, highly functioning people can suffer from an adjustment disorder given a significant blow from a life-changing event. There is no way to prevent pain and loss. Most people need help from time  to time. You are not alone. SEEK MEDICAL CARE IF:  Your feelings or symptoms listed above do not improve or worsen. Document Released: 09/27/2005 Document Revised: 04/17/2011 Document Reviewed: 12/19/2006 [facility] Patient Information 2015 [place], [place]. This information is not intended to replace advice given to you by your health care provider. Make sure you discuss any questions you have with your health care provider.  Bacterial Vaginosis Bacterial vaginosis is an infection of the vagina. It happens when too many of certain germs (bacteria) grow in the vagina. HOME CARE  Take your medicine as told by your doctor.  Finish your medicine even if you start to feel better.  Do not have sex until you finish your medicine and are better.  Tell your sex partner that you have an infection. They should see their doctor for treatment.  Practice safe sex. Use condoms. Have only one sex partner. GET HELP IF:  You are not getting better after 3 days of treatment.  You have more grey fluid (discharge) coming from your vagina than before.  You have more pain than before.  You have a fever. MAKE SURE YOU:   Understand these instructions.  Will watch your condition.  Will get help right away if you are not doing well or get worse. Document Released: 11/02/2007 Document Revised: 11/13/2012 Document Reviewed: 09/04/2012 [facility] Patient Information 2015 [place], [place]. This information is not intended to replace advice given to you by your health care provider. Make sure you discuss any questions you have with your health care provider.  Bacterial Vaginosis Bacterial vaginosis is an infection of the vagina. It happens when too many  of certain germs (bacteria) grow in the vagina. HOME CARE  Take your medicine as told by your doctor.  Finish your medicine even if you start to feel better.  Do not have sex until you finish your medicine and are better.  Tell your sex partner that you  have an infection. They should see their doctor for treatment.  Practice safe sex. Use condoms. Have only one sex partner. GET HELP IF:  You are not getting better after 3 days of treatment.  You have more grey fluid (discharge) coming from your vagina than before.  You have more pain than before.  You have a fever. MAKE SURE YOU:   Understand these instructions.  Will watch your condition.  Will get help right away if you are not doing well or get worse. Document Released: 11/02/2007 Document Revised: 11/13/2012 Document Reviewed: 09/04/2012 [facility] Patient Information 2015 [place], [place]. This information is not intended to replace advice given to you by your health care provider. Make sure you discuss any questions you have with your health care provider.

## 2013-11-11 NOTE — Tx Team (Signed)
Interdisciplinary Treatment Plan Update (Adult) Date: 11/11/2013   Time Reviewed: 9:30 AM  Progress in Treatment: Attending groups: Yes Participating in groups: Yes Taking medication as prescribed: Yes Tolerating medication: Yes Family/Significant other contact made: No, patient has declined for CSW to make contact with family Patient understands diagnosis: Yes Discussing patient identified problems/goals with staff: Yes Medical problems stabilized or resolved: Yes Denies suicidal/homicidal ideation: Yes Issues/concerns per patient self-inventory: Yes Other:  New problem(s) identified: N/A  Discharge Plan or Barriers: Patient to return home to follow up with [facility] [place] for [facility] services.  Reason for Continuation of Hospitalization:  Depression Anxiety Medication Stabilization   Comments: N/A  Estimated length of stay: Discharge anticipated for today 11/11/13.  For review of initial/current patient goals, please see plan of care.   Attendees:  Patient:    Family:    Physician: Dr. Parke Poisson; Dr. Sabra Heck  11/11/2013 9:30 AM   Nursing: Marilynne Halsted; Satira Sark, RN  11/11/2013 9:30 AM   Clinical Social Worker: Tilden Fossa, LCSWA  11/11/2013 9:30 AM   Other: Joette Catching, LCSW  11/11/2013 9:30 AM   Other: Lucinda Dell, [facility] Liaison  11/11/2013 9:30 AM   Other: Lars Pinks, Case Manager  11/11/2013 9:30 AM   Other:  11/11/2013 9:30 AM           Scribe for Treatment Team:  Tilden Fossa, MSW, [employer] [phone]

## 2013-11-11 NOTE — [facility] Group Notes (Signed)
The focus of this group is to educate the patient on the purpose and policies of crisis stabilization and provide a format to answer questions about their admission.  The group details unit policies and expectations of patients while admitted. Patient did not attend this group.

## 2013-11-11 NOTE — Progress Notes (Signed)
[facility] Adult Case Management Discharge Plan :  Will you be returning to the same living situation after discharge: Yes,  patient will be returning home At discharge, do you have transportation home?:Yes,  patient reports that she will have transportation home Do you have the ability to pay for your medications:Yes,  patient will be provided with medication samples and prescriptions at discharge.  Release of information consent forms completed and in the chart;  Patient's signature needed at discharge.  Patient to Follow up at: Follow-up Information   Follow up with [facility]. (Walk-in assessments for therapy and medication management Monday-Friday between 8:30 am - 12 pm & 1pm - 2 pm.)    Specialty:  Professional Counselor   Contact information:   [facility] [place] [address] [phone]       Patient denies SI/HI:   Yes,  denies    Safety Planning and Suicide Prevention discussed:  Yes,  with patient  Kelly Ranieri, Casimiro Needle 11/11/2013, 10:15 AM

## 2013-11-11 NOTE — Plan of Care (Signed)
Problem: Alteration in mood Goal: LTG-Pt's behavior demonstrates decreased signs of depression 10/2: Goal not met: Pt presents with flat affect and depressed mood. Pt admitted with depression rating of 10. Pt to show decreased sign of depression and a rating of 3 or less before d/c.   Tilden Fossa, MSW, [place] Worker [facility] [phone]   (Patient's behavior demonstrates decreased signs of depression to the point the patient is safe to return home and continue treatment in an outpatient setting)  Outcome: Progressing Pt observed interacting on unit with patients, laughing and joking  Problem: Ineffective individual coping Goal: STG: Patient will remain free from self harm Outcome: Progressing Per 15 min check sheets

## 2013-11-14 NOTE — Progress Notes (Signed)
Patient Discharge Instructions:  After Visit Summary (AVS):   Faxed to:  11/14/13 Discharge Summary Note:   Faxed to:  11/14/13 Psychiatric Admission Assessment Note:   Faxed to:  11/14/13 Suicide Risk Assessment - Discharge Assessment:   Faxed to:  11/14/13 Faxed/Sent to the Next Level Care provider:  11/14/13 Faxed to [facility], 11/14/2013, 3:25 PM

## 2013-12-03 ENCOUNTER — Telehealth: Payer: Self-pay | Admitting: *Deleted

## 2013-12-03 DIAGNOSIS — B3731 Acute candidiasis of vulva and vagina: Secondary | ICD-10-CM

## 2013-12-03 DIAGNOSIS — B373 Candidiasis of vulva and vagina: Secondary | ICD-10-CM

## 2013-12-03 MED ORDER — FLUCONAZOLE 150 MG PO TABS: 150.0000 mg | ORAL_TABLET | Freq: Once | ORAL | Status: DC

## 2013-12-03 NOTE — Telephone Encounter (Signed)
Patient called- Urgent problem. 5:26 Patient thinks she has yeast infection and would like treatment. Itching and discharge present. Offered Rx and patient to call if she gets no better.

## 2013-12-08 ENCOUNTER — Encounter: Payer: Self-pay | Admitting: Obstetrics & Gynecology

## 2014-02-02 ENCOUNTER — Encounter: Payer: Self-pay | Admitting: *Deleted

## 2014-02-03 ENCOUNTER — Encounter: Payer: Self-pay | Admitting: Obstetrics & Gynecology

## 2014-02-09 ENCOUNTER — Telehealth: Payer: Self-pay | Admitting: *Deleted

## 2014-02-09 ENCOUNTER — Other Ambulatory Visit: Payer: Self-pay | Admitting: *Deleted

## 2014-02-09 DIAGNOSIS — B9689 Other specified bacterial agents as the cause of diseases classified elsewhere: Secondary | ICD-10-CM

## 2014-02-09 DIAGNOSIS — N760 Acute vaginitis: Principal | ICD-10-CM

## 2014-02-09 MED ORDER — CLINDAMYCIN HCL 300 MG PO CAPS: 300.0000 mg | ORAL_CAPSULE | Freq: Two times a day (BID) | ORAL | Status: AC

## 2014-02-09 MED ORDER — CLINDAMYCIN HCL 300 MG PO CAPS: 300.0000 mg | ORAL_CAPSULE | Freq: Two times a day (BID) | ORAL | Status: DC

## 2014-02-09 NOTE — Telephone Encounter (Signed)
Patient called with symptoms. 12:18 Patient c/o itching and discharge and would like to have treatment called to pharmacy. Patient is allergic to Flagyl and would like something different. Clindamycin sent to pharmacy- patient advised to call office if symptoms do not improve.

## 2014-02-13 ENCOUNTER — Encounter (HOSPITAL_COMMUNITY): Payer: Self-pay

## 2014-02-13 ENCOUNTER — Emergency Department (HOSPITAL_COMMUNITY)
Admission: EM | Admit: 2014-02-13 | Discharge: 2014-02-13 | Disposition: A | Discharge status: Home / Self Care | Payer: Medicaid Other | Attending: Emergency Medicine | Admitting: Emergency Medicine

## 2014-02-13 DIAGNOSIS — Z8619 Personal history of other infectious and parasitic diseases: Secondary | ICD-10-CM | POA: Diagnosis not present

## 2014-02-13 DIAGNOSIS — Z8719 Personal history of other diseases of the digestive system: Secondary | ICD-10-CM | POA: Insufficient documentation

## 2014-02-13 DIAGNOSIS — M722 Plantar fascial fibromatosis: Secondary | ICD-10-CM | POA: Diagnosis not present

## 2014-02-13 DIAGNOSIS — Z79899 Other long term (current) drug therapy: Secondary | ICD-10-CM | POA: Diagnosis not present

## 2014-02-13 DIAGNOSIS — Z86718 Personal history of other venous thrombosis and embolism: Secondary | ICD-10-CM | POA: Insufficient documentation

## 2014-02-13 DIAGNOSIS — M791 Myalgia: Secondary | ICD-10-CM | POA: Insufficient documentation

## 2014-02-13 DIAGNOSIS — Z792 Long term (current) use of antibiotics: Secondary | ICD-10-CM | POA: Insufficient documentation

## 2014-02-13 DIAGNOSIS — M79605 Pain in left leg: Secondary | ICD-10-CM | POA: Diagnosis present

## 2014-02-13 LAB — I-STAT CHEM 8, ED
BUN: 22 mg/dL (ref 6–23)
CREATININE: 0.7 mg/dL (ref 0.50–1.10)
Calcium, Ion: 1.17 mmol/L (ref 1.12–1.23)
Chloride: 101 mEq/L (ref 96–112)
Glucose, Bld: 108 mg/dL — ABNORMAL HIGH (ref 70–99)
HCT: 39 % (ref 36.0–46.0)
HEMOGLOBIN: 13.3 g/dL (ref 12.0–15.0)
Potassium: 4.2 mmol/L (ref 3.5–5.1)
SODIUM: 137 mmol/L (ref 135–145)
TCO2: 24 mmol/L (ref 0–100)

## 2014-02-13 LAB — I-STAT BETA HCG BLOOD, ED (MC, WL, AP ONLY)

## 2014-02-13 MED ORDER — NAPROXEN 500 MG PO TABS: 500.0000 mg | ORAL_TABLET | Freq: Two times a day (BID) | ORAL | Status: DC

## 2014-02-13 NOTE — ED Notes (Signed)
Pt complains of left leg numbness and pain when she sits too long, she states she tried to take vinegar to help with the pain with no relief.

## 2014-02-13 NOTE — Discharge Instructions (Signed)
Plantar Fasciitis Plantar fasciitis is a common condition that causes foot pain. It is soreness (inflammation) of the band of tough fibrous tissue on the bottom of the foot that runs from the heel bone (calcaneus) to the ball of the foot. The cause of this soreness may be from excessive standing, poor fitting shoes, running on hard surfaces, being overweight, having an abnormal walk, or overuse (this is common in runners) of the painful foot or feet. It is also common in aerobic exercise dancers and ballet dancers. SYMPTOMS  Most people with plantar fasciitis complain of:  Severe pain in the morning on the bottom of their foot especially when taking the first steps out of bed. This pain recedes after a few minutes of walking.  Severe pain is experienced also during walking following a long period of inactivity.  Pain is worse when walking barefoot or up stairs DIAGNOSIS   Your caregiver will diagnose this condition by examining and feeling your foot.  Special tests such as X-rays of your foot, are usually not needed. PREVENTION   Consult a sports medicine professional before beginning a new exercise program.  Walking programs offer a good workout. With walking there is a lower chance of overuse injuries common to runners. There is less impact and less jarring of the joints.  Begin all new exercise programs slowly. If problems or pain develop, decrease the amount of time or distance until you are at a comfortable level.  Wear good shoes and replace them regularly.  Stretch your foot and the heel cords at the back of the ankle (Achilles tendon) both before and after exercise.  Run or exercise on even surfaces that are not hard. For example, asphalt is better than pavement.  Do not run barefoot on hard surfaces.  If using a treadmill, vary the incline.  Do not continue to workout if you have foot or joint problems. Seek professional help if they do not improve. HOME CARE INSTRUCTIONS     Avoid activities that cause you pain until you recover.  Use ice or cold packs on the problem or painful areas after working out.  Only take over-the-counter or prescription medicines for pain, discomfort, or fever as directed by your caregiver.  Soft shoe inserts or athletic shoes with air or gel sole cushions may be helpful.  If problems continue or become more severe, consult a sports medicine caregiver or your own health care provider. Cortisone is a potent anti-inflammatory medication that may be injected into the painful area. You can discuss this treatment with your caregiver. MAKE SURE YOU:   Understand these instructions.  Will watch your condition.  Will get help right away if you are not doing well or get worse. Document Released: 10/18/2000 Document Revised: 04/17/2011 Document Reviewed: 12/18/2007 [facility] Patient Information 2015 [place], [place]. This information is not intended to replace advice given to you by your health care provider. Make sure you discuss any questions you have with your health care provider.  [place]: Routine Care for Injuries The routine care of many injuries includes Rest, Ice, Compression, and Elevation ([place]). HOME CARE INSTRUCTIONS  Rest is needed to allow your body to heal. Routine activities can usually be resumed when comfortable. Injured tendons and bones can take up to 6 weeks to heal. Tendons are the cord-like structures that attach muscle to bone.  Ice following an injury helps keep the swelling down and reduces pain.  Put ice in a plastic bag.  Place a towel between your skin  and the bag.  Leave the ice on for 15-20 minutes, 3-4 times a day, or as directed by your health care provider. Do this while awake, for the first 24 to 48 hours. After that, continue as directed by your caregiver.  Compression helps keep swelling down. It also gives support and helps with discomfort. If an elastic bandage has been applied, it should be removed  and reapplied every 3 to 4 hours. It should not be applied tightly, but firmly enough to keep swelling down. Watch fingers or toes for swelling, bluish discoloration, coldness, numbness, or excessive pain. If any of these problems occur, remove the bandage and reapply loosely. Contact your caregiver if these problems continue.  Elevation helps reduce swelling and decreases pain. With extremities, such as the arms, hands, legs, and feet, the injured area should be placed near or above the level of the heart, if possible. SEEK IMMEDIATE MEDICAL CARE IF:  You have persistent pain and swelling.  You develop redness, numbness, or unexpected weakness.  Your symptoms are getting worse rather than improving after several days. These symptoms may indicate that further evaluation or further X-rays are needed. Sometimes, X-rays may not show a small broken bone (fracture) until 1 week or 10 days later. Make a follow-up appointment with your caregiver. Ask when your X-ray results will be ready. Make sure you get your X-ray results. Document Released: 05/07/2000 Document Revised: 01/28/2013 Document Reviewed: 06/24/2010 [facility] Patient Information 2015 [place], [place]. This information is not intended to replace advice given to you by your health care provider. Make sure you discuss any questions you have with your health care provider.

## 2014-02-13 NOTE — ED Provider Notes (Signed)
CSN: [id number]     Arrival date & time 02/13/14  0051 History   First MD Initiated Contact with Patient 02/13/14 0123     Chief Complaint  Patient presents with  . 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 Patient states that she awoke from sleep yesterday morning with pain in the bottom of her bilateral feet. She states the pain was located primarily in her heels. She describes the discomfort as a "pins and needles" sensation. She states that pain improved slightly after standing and walking. She also took Tylenol for symptoms which relieved her pain temporarily; however, pain returned after approximately one hour. She states that she feels the pins and needles sensation in her posterior left leg. Pain is worsened when lying "completely straight". Patient states that she called her grandmother to discuss the pain and her grandmother told her to drink vinegar. Patient did drink 2 tablespoons of vinegar for symptoms without relief. Patient denies associated fever, back pain, extremity numbness or weakness, back injury, bowel or bladder incontinence, difficulty walking, leg swelling, history of cancer, or history of IV drug use.  Patient is a 19 y.o. female presenting with leg pain. The history is provided by the patient. No language interpreter was used.  Leg Pain Associated symptoms: no fever     Past Medical History  Diagnosis Date  . History of blood clots     Blood clot in  Right overy after delivery  . Wisdom teeth extracted   . Herpes genitalis in women     currently taking medications   Past Surgical History  Procedure Laterality Date  . Wisdom tooth extraction     Family History  Problem Relation Age of Onset  . Asthma Brother    History   Substance Use Topics  . Smoking status: Never Smoker   . Smokeless tobacco: Not on file  . Alcohol Use: Yes     Comment: Pt reports drinking wine and beer at least 3-4 days per week. Pt reports increased used over the past couple of months.   OB History    Gravida Para Term Preterm AB TAB SAB Ectopic Multiple Living   2 2 2  0 0 0 0 0 0 2      Review of Systems  Constitutional: Negative for fever.  Cardiovascular: Negative for leg swelling.  Musculoskeletal: Positive for myalgias.  Neurological: Negative for syncope, weakness and numbness.  All other systems reviewed and are negative.   Allergies  Flagyl; Apple; Ibuprofen; Norethindrone; and Other  Home Medications   Prior to Admission medications   Medication Sig Start Date End Date Taking? Authorizing Provider  clindamycin (CLEOCIN) 300 MG capsule Take 1 capsule (300 mg total) by mouth every 12 (twelve) hours. Take every twelve hours until finished for infection. Next dose due 11/11/13 at 8 pm. 02/09/14 02/16/14  Shelly Bombard, MD  clindamycin (CLEOCIN) 300 MG capsule Take 1 capsule (300 mg total) by mouth 2 (two) times daily. 02/09/14   Shelly Bombard, MD  doxycycline (VIBRA-TABS) 100 MG tablet Take 1 tablet (100 mg total) by mouth every 12 (twelve) hours. Take every twelve hours until finished for treatment of infection. 11/11/13   Elmarie Shiley, NP  fluconazole (DIFLUCAN) 150 MG tablet Take 1 tablet (150 mg total) by mouth once. 12/03/13  Lahoma Crocker, MD  risperiDONE (RISPERDAL) 0.5 MG tablet Take 1 tablet (0.5 mg total) by mouth 2 (two) times daily. 11/11/13   Elmarie Shiley, NP  risperiDONE (RISPERDAL) 2 MG tablet Take 1 tablet (2 mg total) by mouth at bedtime. 11/11/13   Elmarie Shiley, NP  sertraline (ZOLOFT) 100 MG tablet Take 1 tablet (100 mg total) by mouth daily. 11/11/13   Elmarie Shiley, NP  valACYclovir (VALTREX) 500 MG tablet Take 1 tablet (500 mg total) by mouth daily. 11/11/13   Elmarie Shiley, NP   BP 121/74 mmHg  Pulse  73  Temp(Src) 98.4 F (36.9 C) (Oral)  Resp 20  Ht 5\' 7"  (1.702 m)  Wt 162 lb (73.483 kg)  BMI 25.37 kg/m2  SpO2 100%  LMP 01/13/2014   Physical Exam  Constitutional: She is oriented to person, place, and time. She appears well-developed and well-nourished. No distress.  Nontoxic/nonseptic appearing  HENT:  Head: Normocephalic and atraumatic.  Eyes: Conjunctivae and EOM are normal. No scleral icterus.  Neck: Normal range of motion.  Cardiovascular: Normal rate, regular rhythm and intact distal pulses.   DP and PT pulses 2+ b/l  Pulmonary/Chest: Effort normal. No respiratory distress.  Respirations even and unlabored  Musculoskeletal: Normal range of motion. She exhibits tenderness.  TTP to L plantar fascia and L shin. Mild TTP to heel of R foot. No swelling to b/l lower extremities. No pitting edema. No calf TTP b/l.  Neurological: She is alert and oriented to person, place, and time. She exhibits normal muscle tone. Coordination normal.  Sensation to light touch intact. Patient ambulatory with normal gait. Patellar and achilles reflexes 2+ b/l.  Skin: Skin is warm and dry. No rash noted. She is not diaphoretic. No erythema. No pallor.  Psychiatric: She has a normal mood and affect. Her behavior is normal.  Nursing note and vitals reviewed.   ED Course  Procedures (including critical care time) Labs Review Labs Reviewed  I-STAT CHEM 8, ED - Abnormal; Notable for the following:    Glucose, Bld 108 (*)    All other components within normal limits  I-STAT BETA HCG BLOOD, ED ([facility], [facility], [facility] ONLY)    Imaging Review No results found.   EKG Interpretation None      MDM   Final diagnoses:  Plantar fasciitis    19 year old female presents to the emergency department for further evaluation of paresthesias in her bilateral heels with onset yesterday morning. Patient is neurovascularly intact on physical exam. She has tenderness to palpation of her left plantar fascia as well as  to her right heel. There is also tenderness noted to the left anterior lower extremity/shin. No swelling or pitting edema. Patient with Well's DVT score of 1 c/w low risk; point given for hx of DVT. Given physical exam findings, suspect plantar fasciitis as cause of symptoms. Will manage as outpatient with NSAIDs and icing as well as stretching. Return precautions discussed and provided. Patient agreeable to plan with no unaddressed concerns. Patient discharged in good condition; VSS.   Filed Vitals:   02/13/14 0108 02/13/14 0256  BP: 121/74 117/68  Pulse: 73 70  Temp: 98.4 F (36.9 C)   TempSrc: Oral   Resp: 20 20  Height: 5\' 7"  (1.702 m)   Weight: 162 lb (73.483 kg)   SpO2: 100% 99%     Antonietta Breach, PA-C 02/13/14 [place], MD 02/13/14 [phone]

## 2014-02-20 ENCOUNTER — Ambulatory Visit: Payer: Medicaid Other | Admitting: Obstetrics

## 2014-03-03 ENCOUNTER — Telehealth: Payer: Self-pay | Admitting: Obstetrics

## 2014-03-04 NOTE — Telephone Encounter (Signed)
[id number] - Patient called this morning and rescheduled appt. brm

## 2014-03-10 ENCOUNTER — Ambulatory Visit: Payer: Medicaid Other | Admitting: Obstetrics

## 2014-03-13 ENCOUNTER — Encounter: Payer: Self-pay | Admitting: Obstetrics

## 2014-03-13 ENCOUNTER — Ambulatory Visit (INDEPENDENT_AMBULATORY_CARE_PROVIDER_SITE_OTHER): Payer: Medicaid Other | Admitting: Obstetrics

## 2014-03-13 VITALS — BP 130/79 | HR 93 | Temp 98.3°F | Wt 173.0 lb

## 2014-03-13 DIAGNOSIS — B9689 Other specified bacterial agents as the cause of diseases classified elsewhere: Secondary | ICD-10-CM

## 2014-03-13 DIAGNOSIS — Z0000 Encounter for general adult medical examination without abnormal findings: Secondary | ICD-10-CM

## 2014-03-13 DIAGNOSIS — Z113 Encounter for screening for infections with a predominantly sexual mode of transmission: Secondary | ICD-10-CM

## 2014-03-13 DIAGNOSIS — N760 Acute vaginitis: Secondary | ICD-10-CM

## 2014-03-13 DIAGNOSIS — A499 Bacterial infection, unspecified: Secondary | ICD-10-CM

## 2014-03-13 DIAGNOSIS — Z30011 Encounter for initial prescription of contraceptive pills: Secondary | ICD-10-CM

## 2014-03-13 LAB — HIV ANTIBODY (ROUTINE TESTING W REFLEX): HIV 1&2 Ab, 4th Generation: NONREACTIVE

## 2014-03-13 LAB — RPR

## 2014-03-13 LAB — HEPATITIS C ANTIBODY: HCV Ab: NEGATIVE

## 2014-03-13 LAB — HEPATITIS B SURFACE ANTIGEN: Hepatitis B Surface Ag: NEGATIVE

## 2014-03-13 MED ORDER — NORGESTIMATE-ETH ESTRADIOL 0.25-35 MG-MCG PO TABS: 1.0000 | ORAL_TABLET | Freq: Every day | ORAL | Status: DC

## 2014-03-13 MED ORDER — PNV PRENATAL PLUS MULTIVITAMIN 27-1 MG PO TABS: 1.0000 | ORAL_TABLET | Freq: Every day | ORAL | Status: DC

## 2014-03-13 MED ORDER — CLINDAMYCIN HCL 300 MG PO CAPS: 300.0000 mg | ORAL_CAPSULE | Freq: Three times a day (TID) | ORAL | Status: DC

## 2014-03-13 NOTE — Progress Notes (Signed)
Patient ID: Amy Dougherty, female   DOB: [birth year], 19 y.o.   MRN: [id number]  Chief Complaint  Patient presents with  . Vaginitis    d/c with odor, would like std testing    HPI [name] is a 19 y.o. female.  Malodorous vaginal discharge.  HPI  Past Medical History  Diagnosis Date  . History of blood clots     Blood clot in  Right overy after delivery  . Wisdom teeth extracted   . Herpes genitalis in women     currently taking medications    Past Surgical History  Procedure Laterality Date  . Wisdom tooth extraction      Family History  Problem Relation Age of Onset  . Asthma Brother     Social History History  Substance Use Topics  . Smoking status: Never Smoker   . Smokeless tobacco: Not on file  . Alcohol Use: Yes     Comment: Pt reports drinking wine and beer at least 3-4 days per week. Pt reports increased used over the past couple of months.    Allergies  Allergen Reactions  . Flagyl [Metronidazole] Rash  . Apple Swelling  . Ibuprofen Other (See Comments)    Blood thinner medications  . Norethindrone Other (See Comments)    headaches  . Other Itching    Tree nuts    Current Outpatient Prescriptions  Medication Sig Dispense Refill  . valACYclovir (VALTREX) 500 MG tablet Take 1 tablet (500 mg total) by mouth daily. 30 tablet 0  . clindamycin (CLEOCIN) 300 MG capsule Take 1 capsule (300 mg total) by mouth 2 (two) times daily. (Patient not taking: Reported on 03/13/2014) 14 capsule 0  . clindamycin (CLEOCIN) 300 MG capsule Take 1 capsule (300 mg total) by mouth 3 (three) times daily. 21 capsule 2  . doxycycline (VIBRA-TABS) 100 MG tablet Take 1 tablet (100 mg total) by mouth every 12 (twelve) hours. Take every twelve hours until finished for treatment of infection. (Patient not taking: Reported on 03/13/2014) 3 tablet 0  . fluconazole (DIFLUCAN) 150 MG tablet Take 1 tablet (150 mg total) by mouth once. (Patient not taking: Reported on 03/13/2014) 1 tablet  0  . naproxen (NAPROSYN) 500 MG tablet Take 1 tablet (500 mg total) by mouth 2 (two) times daily. (Patient not taking: Reported on 03/13/2014) 30 tablet 0  . norgestimate-ethinyl estradiol (ORTHO-CYCLEN,SPRINTEC,PREVIFEM) 0.25-35 MG-MCG tablet Take 1 tablet by mouth daily. 1 Package 11  . Prenatal Vit-Fe Fumarate-FA (PNV PRENATAL PLUS MULTIVITAMIN) 27-1 MG TABS Take 1 tablet by mouth daily before breakfast. 30 tablet 11  . risperiDONE (RISPERDAL) 0.5 MG tablet Take 1 tablet (0.5 mg total) by mouth 2 (two) times daily. (Patient not taking: Reported on 03/13/2014) 60 tablet 0  . risperiDONE (RISPERDAL) 2 MG tablet Take 1 tablet (2 mg total) by mouth at bedtime. 30 tablet 0  . sertraline (ZOLOFT) 100 MG tablet Take 1 tablet (100 mg total) by mouth daily. (Patient not taking: Reported on 03/13/2014) 30 tablet 0  . [DISCONTINUED] cetirizine (ZYRTEC) 10 MG tablet Take 10 mg by mouth daily as needed. For cold symptoms     No current facility-administered medications for this visit.    Review of Systems Review of Systems Constitutional: negative for fatigue and weight loss Respiratory: negative for cough and wheezing Cardiovascular: negative for chest pain, fatigue and palpitations Gastrointestinal: negative for abdominal pain and change in bowel habits Genitourinary: malodorous vaginal discharge Integument/breast: negative for nipple discharge Musculoskeletal:negative for  myalgias Neurological: negative for gait problems and tremors Behavioral/Psych: negative for abusive relationship, depression Endocrine: negative for temperature intolerance     Blood pressure 130/79, pulse 93, temperature 98.3 F (36.8 C), weight 173 lb (78.472 kg), last menstrual period 02/13/2014, not currently breastfeeding.  Physical Exam Physical Exam General:   alert  Skin:   no rash or abnormalities  Lungs:   clear to auscultation bilaterally  Heart:   regular rate and rhythm, S1, S2 normal, no murmur, click, rub or  gallop  Breasts:   normal without suspicious masses, skin or nipple changes or axillary nodes  Abdomen:  normal findings: no organomegaly, soft, non-tender and no hernia  Pelvis:  External genitalia: normal general appearance Urinary system: urethral meatus normal and bladder without fullness, nontender Vaginal: normal without tenderness, induration or masses.  Copious grey, thin discharge Cervix: normal appearance Adnexa: normal bimanual exam Uterus: anteverted and non-tender, normal size      Data Reviewed Labs  Assessment     BV  STD Screening Contraceptive Counseling H/O chlamydia    Plan    Clindamycin Rx  Orders Placed This Encounter  Procedures  . SureSwab, Vaginosis/Vaginitis Plus  . HIV antibody  . Hepatitis B surface antigen  . RPR  . Hepatitis C antibody   Meds ordered this encounter  Medications  . clindamycin (CLEOCIN) 300 MG capsule    Sig: Take 1 capsule (300 mg total) by mouth 3 (three) times daily.    Dispense:  21 capsule    Refill:  2  . norgestimate-ethinyl estradiol (ORTHO-CYCLEN,SPRINTEC,PREVIFEM) 0.25-35 MG-MCG tablet    Sig: Take 1 tablet by mouth daily.    Dispense:  1 Package    Refill:  11  . Prenatal Vit-Fe Fumarate-FA (PNV PRENATAL PLUS MULTIVITAMIN) 27-1 MG TABS    Sig: Take 1 tablet by mouth daily before breakfast.    Dispense:  30 tablet    Refill:  11

## 2014-03-18 LAB — SURESWAB, VAGINOSIS/VAGINITIS PLUS
Atopobium vaginae: 6.7 Log (cells/mL)
C. ALBICANS, DNA: DETECTED — AB
C. TROPICALIS, DNA: NOT DETECTED
C. glabrata, DNA: NOT DETECTED
C. parapsilosis, DNA: NOT DETECTED
C. trachomatis RNA, TMA: DETECTED — AB
LACTOBACILLUS SPECIES: NOT DETECTED Log (cells/mL)
MEGASPHAERA SPECIES: 7.6 Log (cells/mL)
N. GONORRHOEAE RNA, TMA: NOT DETECTED
T. vaginalis RNA, QL TMA: NOT DETECTED

## 2014-03-19 ENCOUNTER — Other Ambulatory Visit: Payer: Self-pay | Admitting: Obstetrics

## 2014-03-19 DIAGNOSIS — B373 Candidiasis of vulva and vagina: Secondary | ICD-10-CM

## 2014-03-19 DIAGNOSIS — A5609 Other chlamydial infection of lower genitourinary tract: Principal | ICD-10-CM

## 2014-03-19 DIAGNOSIS — A5602 Chlamydial vulvovaginitis: Secondary | ICD-10-CM

## 2014-03-19 DIAGNOSIS — B3731 Acute candidiasis of vulva and vagina: Secondary | ICD-10-CM

## 2014-03-19 MED ORDER — FLUCONAZOLE 150 MG PO TABS: 150.0000 mg | ORAL_TABLET | Freq: Once | ORAL | Status: DC

## 2014-03-19 MED ORDER — CEFIXIME 400 MG PO TABS: 400.0000 mg | ORAL_TABLET | Freq: Every day | ORAL | Status: DC

## 2014-03-19 MED ORDER — AZITHROMYCIN 250 MG PO TABS: 1000.0000 mg | ORAL_TABLET | Freq: Once | ORAL | Status: DC

## 2014-04-08 ENCOUNTER — Emergency Department (HOSPITAL_COMMUNITY)
Admission: EM | Admit: 2014-04-08 | Discharge: 2014-04-08 | Discharge status: Against Medical Advice | Payer: Medicaid Other | Attending: Emergency Medicine | Admitting: Emergency Medicine

## 2014-04-08 ENCOUNTER — Encounter (HOSPITAL_COMMUNITY): Payer: Self-pay

## 2014-04-08 DIAGNOSIS — R251 Tremor, unspecified: Secondary | ICD-10-CM | POA: Insufficient documentation

## 2014-04-08 NOTE — ED Notes (Signed)
Pt smoked marijuana tonight and started getting shaky and paranoid that it was laced, pt wants to be evaluated because she states usually the "high" is worn off by now

## 2014-04-28 ENCOUNTER — Telehealth: Payer: Self-pay | Admitting: *Deleted

## 2014-04-28 NOTE — Telephone Encounter (Signed)
Patient left message that she wants a different medication that is covered by her insurance. 3:18 LM on VM to CB

## 2014-04-28 NOTE — Telephone Encounter (Signed)
PATIENT IS CALLING REQUESTING THAT WE SEND COVERED MEDICATIONS TO THE PHARMACY FOR HER.  4:00 Spoke with patient she states her medication from 2/11 is not covered and she would like something cheaper. Call to patient's pharmacy to see what the problem is- they state that she has another insurance coming up with her medicaid. Called patient back and she states she has no other insurance,but Medicaid. Patient advised to call her worker to get that taken off so she can afford her medication. Need to call Brandi at health dept and let her know patient not treated.

## 2014-04-29 NOTE — Telephone Encounter (Signed)
See next note

## 2014-05-11 ENCOUNTER — Ambulatory Visit: Payer: Medicaid Other | Admitting: Obstetrics & Gynecology

## 2014-07-20 ENCOUNTER — Ambulatory Visit: Payer: Medicaid Other | Admitting: Obstetrics

## 2014-07-23 ENCOUNTER — Ambulatory Visit: Payer: Medicaid Other | Admitting: Certified Nurse Midwife

## 2014-07-30 ENCOUNTER — Ambulatory Visit: Payer: Medicaid Other | Admitting: Certified Nurse Midwife

## 2014-07-31 ENCOUNTER — Ambulatory Visit: Payer: Medicaid Other | Admitting: Certified Nurse Midwife

## 2014-09-02 ENCOUNTER — Ambulatory Visit: Payer: Medicaid Other | Admitting: Certified Nurse Midwife

## 2014-09-03 ENCOUNTER — Ambulatory Visit: Payer: Medicaid Other | Admitting: Certified Nurse Midwife

## 2014-09-08 ENCOUNTER — Ambulatory Visit (INDEPENDENT_AMBULATORY_CARE_PROVIDER_SITE_OTHER): Payer: Medicaid Other | Admitting: Certified Nurse Midwife

## 2014-09-08 ENCOUNTER — Encounter: Payer: Self-pay | Admitting: Certified Nurse Midwife

## 2014-09-08 VITALS — BP 148/84 | HR 85 | Temp 98.7°F | Ht 67.0 in | Wt 178.0 lb

## 2014-09-08 DIAGNOSIS — F329 Major depressive disorder, single episode, unspecified: Secondary | ICD-10-CM

## 2014-09-08 DIAGNOSIS — N72 Inflammatory disease of cervix uteri: Secondary | ICD-10-CM

## 2014-09-08 DIAGNOSIS — Z0000 Encounter for general adult medical examination without abnormal findings: Secondary | ICD-10-CM | POA: Diagnosis not present

## 2014-09-08 DIAGNOSIS — Z3202 Encounter for pregnancy test, result negative: Secondary | ICD-10-CM | POA: Diagnosis not present

## 2014-09-08 DIAGNOSIS — F319 Bipolar disorder, unspecified: Secondary | ICD-10-CM

## 2014-09-08 DIAGNOSIS — Z113 Encounter for screening for infections with a predominantly sexual mode of transmission: Secondary | ICD-10-CM

## 2014-09-08 DIAGNOSIS — Z01419 Encounter for gynecological examination (general) (routine) without abnormal findings: Secondary | ICD-10-CM

## 2014-09-08 DIAGNOSIS — F32A Depression, unspecified: Secondary | ICD-10-CM

## 2014-09-08 DIAGNOSIS — N926 Irregular menstruation, unspecified: Secondary | ICD-10-CM

## 2014-09-08 LAB — COMPREHENSIVE METABOLIC PANEL
ALK PHOS: 60 U/L (ref 33–115)
ALT: 10 U/L (ref 6–29)
AST: 17 U/L (ref 10–30)
Albumin: 4.6 g/dL (ref 3.6–5.1)
BILIRUBIN TOTAL: 0.3 mg/dL (ref 0.2–1.2)
BUN: 9 mg/dL (ref 7–25)
CALCIUM: 9.7 mg/dL (ref 8.6–10.2)
CO2: 25 mmol/L (ref 20–31)
Chloride: 101 mmol/L (ref 98–110)
Creat: 0.62 mg/dL (ref 0.50–1.10)
Glucose, Bld: 85 mg/dL (ref 65–99)
Potassium: 4.1 mmol/L (ref 3.5–5.3)
Sodium: 138 mmol/L (ref 135–146)
Total Protein: 7.7 g/dL (ref 6.1–8.1)

## 2014-09-08 LAB — CBC WITH DIFFERENTIAL/PLATELET
Basophils Absolute: 0 10*3/uL (ref 0.0–0.1)
Basophils Relative: 0 % (ref 0–1)
EOS PCT: 2 % (ref 0–5)
Eosinophils Absolute: 0.2 10*3/uL (ref 0.0–0.7)
HEMATOCRIT: 38.9 % (ref 36.0–46.0)
HEMOGLOBIN: 12.7 g/dL (ref 12.0–15.0)
Lymphocytes Relative: 32 % (ref 12–46)
Lymphs Abs: 2.4 10*3/uL (ref 0.7–4.0)
MCH: 26.2 pg (ref 26.0–34.0)
MCHC: 32.6 g/dL (ref 30.0–36.0)
MCV: 80.2 fL (ref 78.0–100.0)
MONO ABS: 0.5 10*3/uL (ref 0.1–1.0)
MPV: 10.2 fL (ref 8.6–12.4)
Monocytes Relative: 7 % (ref 3–12)
NEUTROS PCT: 59 % (ref 43–77)
Neutro Abs: 4.5 10*3/uL (ref 1.7–7.7)
PLATELETS: 324 10*3/uL (ref 150–400)
RBC: 4.85 MIL/uL (ref 3.87–5.11)
RDW: 14.1 % (ref 11.5–15.5)
WBC: 7.6 10*3/uL (ref 4.0–10.5)

## 2014-09-08 LAB — TSH: TSH: 0.468 u[IU]/mL (ref 0.350–4.500)

## 2014-09-08 LAB — CHOLESTEROL, TOTAL: Cholesterol: 199 mg/dL (ref 125–200)

## 2014-09-08 LAB — HEPATITIS B SURFACE ANTIGEN: Hepatitis B Surface Ag: NEGATIVE

## 2014-09-08 LAB — TRIGLYCERIDES: Triglycerides: 195 mg/dL — ABNORMAL HIGH (ref <150)

## 2014-09-08 LAB — HDL CHOLESTEROL: HDL: 82 mg/dL (ref >=46)

## 2014-09-08 LAB — HEPATITIS C ANTIBODY: HCV AB: NEGATIVE

## 2014-09-08 LAB — HIV ANTIBODY (ROUTINE TESTING W REFLEX): HIV 1&2 Ab, 4th Generation: NONREACTIVE

## 2014-09-08 LAB — POCT URINE PREGNANCY: PREG TEST UR: NEGATIVE

## 2014-09-08 MED ORDER — TINIDAZOLE 500 MG PO TABS: 2.0000 g | ORAL_TABLET | Freq: Once | ORAL | Status: AC

## 2014-09-08 MED ORDER — AZITHROMYCIN 250 MG PO TABS: ORAL_TABLET | ORAL | Status: DC

## 2014-09-08 MED ORDER — FLUCONAZOLE 100 MG PO TABS: 100.0000 mg | ORAL_TABLET | Freq: Once | ORAL | Status: DC

## 2014-09-08 MED ORDER — BUPROPION HCL ER (SR) 150 MG PO TB12: 150.0000 mg | ORAL_TABLET | Freq: Two times a day (BID) | ORAL | Status: DC

## 2014-09-08 NOTE — Progress Notes (Signed)
Patient ID: Amy Dougherty, female   DOB: 12/02/1995, 19 y.o.   MRN: [id number]    Subjective:     Amy Dougherty is a 19 y.o. female here for a routine exam.  Current complaints: irregular periods and heavy bleeding with periods, hx of herpes ? Current outbreak. Currently sexually active, sporadic condom use.  Has had a hx of Chlamydia, [facility].   Was being treated for Bipolar, not currently on meds or seeing counselor.  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  Occasionally smokes.    Personal health questionnaire:  Is patient Ashkenazi Jewish, have a family history of breast and/or ovarian cancer: no Is there a family history of uterine cancer diagnosed at age < 21, gastrointestinal cancer, urinary tract cancer, family member who is a Field seismologist syndrome-associated carrier: no Is the patient overweight and hypertensive, family history of diabetes, personal history of gestational diabetes, preeclampsia or PCOS: no Is patient over 70, have PCOS,  family history of premature CHD under age 67, diabetes, smoke, have hypertension or peripheral artery disease:  no At any time, has a partner hit, kicked or otherwise hurt or frightened you?: not currently Over the past 2 weeks, have you felt down, depressed or hopeless?: yes Over the past 2 weeks, have you felt little interest or pleasure in doing things?:sometimes   Gynecologic History Patient's last menstrual period was 08/04/2014 (exact date). Contraception: occasional condom use Last Pap: N/A.  Last mammogram: N/A.   Obstetric History OB History  Gravida Para Term Preterm AB SAB TAB Ectopic Multiple Living  2 2 2  0 0 0 0 0 0 2    # Outcome Date GA Lbr Len/2nd Weight Sex Delivery Anes PTL Lv  2 Term 10/14/12 [redacted]w[redacted]d 04:47 / 00:03 6 lb 3.7 oz (2.825 kg) M Vag-Spont None  Y  1 Term 02/18/10    M Vag-Spont EPI  Y       Past Medical History  Diagnosis Date  . History of blood clots     Blood clot in  Right overy after delivery  . Wisdom teeth extracted   . Herpes genitalis in women     currently taking medications    Past Surgical History  Procedure Laterality Date  . Wisdom tooth extraction       Current outpatient prescriptions:  .  valACYclovir (VALTREX) 500 MG tablet, Take 1 tablet (500 mg total) by mouth daily., Disp: 30 tablet, Rfl: 0 .  azithromycin (ZITHROMAX) 250 MG tablet, Take 4 tablets at once now., Disp: 4 tablet, Rfl: 0 .  buPROPion (WELLBUTRIN SR) 150 MG 12 hr tablet, Take 1 tablet (150 mg total) by mouth 2 (two) times daily., Disp: 60 tablet, Rfl: 6 .  cefixime (SUPRAX) 400 MG tablet, Take 1 tablet (400 mg total) by mouth daily. (Patient not taking: Reported on 09/08/2014), Disp: 1 tablet, Rfl: 0 .  clindamycin (CLEOCIN) 300 MG capsule, Take 1 capsule (300 mg total) by mouth 2 (two) times daily. (Patient not taking: Reported on 03/13/2014), Disp: 14 capsule, Rfl: 0 .  clindamycin (CLEOCIN) 300 MG capsule, Take 1 capsule (300 mg total) by mouth 3 (three) times daily. (Patient not taking: Reported on 09/08/2014), Disp: 21 capsule, Rfl: 2 .  doxycycline (VIBRA-TABS) 100 MG tablet, Take 1 tablet (100 mg total) by mouth every 12 (twelve) hours. Take every twelve hours until finished for  treatment of infection. (Patient not taking: Reported on 03/13/2014), Disp: 3 tablet, Rfl: 0 .  fluconazole (DIFLUCAN) 100 MG tablet, Take 1 tablet (100 mg total) by mouth once. Repeat dose in 48-72 hour., Disp: 3 tablet, Rfl: 0 .  naproxen (NAPROSYN) 500 MG tablet, Take 1 tablet (500 mg total) by mouth 2 (two) times daily. (Patient not taking: Reported on 03/13/2014), Disp: 30 tablet, Rfl: 0 .  norgestimate-ethinyl estradiol (ORTHO-CYCLEN,SPRINTEC,PREVIFEM) 0.25-35 MG-MCG tablet, Take 1 tablet by mouth daily. (Patient not taking: Reported on 09/08/2014), Disp: 1 Package, Rfl: 11 .  Prenatal Vit-Fe Fumarate-FA (PNV  PRENATAL PLUS MULTIVITAMIN) 27-1 MG TABS, Take 1 tablet by mouth daily before breakfast. (Patient not taking: Reported on 09/08/2014), Disp: 30 tablet, Rfl: 11 .  risperiDONE (RISPERDAL) 0.5 MG tablet, Take 1 tablet (0.5 mg total) by mouth 2 (two) times daily. (Patient not taking: Reported on 03/13/2014), Disp: 60 tablet, Rfl: 0 .  risperiDONE (RISPERDAL) 2 MG tablet, Take 1 tablet (2 mg total) by mouth at bedtime. (Patient not taking: Reported on 09/08/2014), Disp: 30 tablet, Rfl: 0 .  sertraline (ZOLOFT) 100 MG tablet, Take 1 tablet (100 mg total) by mouth daily. (Patient not taking: Reported on 03/13/2014), Disp: 30 tablet, Rfl: 0 .  tinidazole (TINDAMAX) 500 MG tablet, Take 4 tablets (2,000 mg total) by mouth once., Disp: 8 tablet, Rfl: 0 .  [DISCONTINUED] cetirizine (ZYRTEC) 10 MG tablet, Take 10 mg by mouth daily as needed. For cold symptoms, Disp: , Rfl:  Allergies  Allergen Reactions  . Flagyl [Metronidazole] Rash  . Apple Swelling  . Ibuprofen Other (See Comments)    Blood thinner medications  . Norethindrone Other (See Comments)    headaches  . Other Itching    Tree nuts    History  Substance Use Topics  . Smoking status: Light Tobacco Smoker  . Smokeless tobacco: Not on file  . Alcohol Use: 0.0 oz/week    0 Standard drinks or equivalent per week     Comment: Pt reports drinking wine and beer at least 3-4 days per week. Pt reports increased used over the past couple of months.    Family History  Problem Relation Age of Onset  . Asthma Brother       Review of Systems  Constitutional: negative for fatigue and weight loss Respiratory: negative for cough and wheezing Cardiovascular: negative for chest pain, fatigue and palpitations Gastrointestinal: negative for abdominal pain and change in bowel habits Musculoskeletal:negative for myalgias Neurological: negative for gait problems and tremors Behavioral/Psych: negative for abusive relationship, depression Endocrine: negative for  temperature intolerance   Genitourinary:negative for abnormal menstrual periods, genital lesions, hot flashes, sexual problems and vaginal discharge Integument/breast: negative for breast lump, breast tenderness, nipple discharge and skin lesion(s)    Objective:       BP 148/84 mmHg  Pulse 85  Temp(Src) 98.7 F (37.1 C)  Ht 5\' 7"  (1.702 m)  Wt 178 lb (80.74 kg)  BMI 27.87 kg/m2  LMP 08/04/2014 (Exact Date) General:   alert  Skin:   no rash or abnormalities  Lungs:   clear to auscultation bilaterally  Heart:   regular rate and rhythm, S1, S2 normal, no murmur, click, rub or gallop  Breasts:   normal without suspicious masses, skin or nipple changes or axillary nodes  Abdomen:  normal findings: no organomegaly, soft, non-tender and no hernia  Pelvis:  External genitalia: normal general appearance Urinary system: urethral meatus normal and bladder without fullness, nontender Vaginal: normal without tenderness,  induration or masses Cervix: no CMT, +friability, +mucopurulent drainge present Adnexa: normal bimanual exam Uterus: anteverted and non-tender, normal size   Lab Review Urine pregnancy test Labs reviewed yes Radiologic studies reviewed no  50% of 30 min visit spent on counseling and coordination of care.   Assessment:  Irregular periods Depression with h/o bipolar not on treatment at this time   Cervicitis.   STD screening examination   Plan:    Education reviewed: calcium supplements, depression evaluation, low fat, low cholesterol diet, safe sex/STD prevention, self breast exams, skin cancer screening, smoking cessation and weight bearing exercise. Contraception: encouraged condom use until an IUD can be placed. Follow up in: 1 month.   Meds ordered this encounter  Medications  . buPROPion (WELLBUTRIN SR) 150 MG 12 hr tablet    Sig: Take 1 tablet (150 mg total) by mouth 2 (two) times daily.    Dispense:  60 tablet    Refill:  6  . azithromycin (ZITHROMAX)  250 MG tablet    Sig: Take 4 tablets at once now.    Dispense:  4 tablet    Refill:  0  . fluconazole (DIFLUCAN) 100 MG tablet    Sig: Take 1 tablet (100 mg total) by mouth once. Repeat dose in 48-72 hour.    Dispense:  3 tablet    Refill:  0  . tinidazole (TINDAMAX) 500 MG tablet    Sig: Take 4 tablets (2,000 mg total) by mouth once.    Dispense:  8 tablet    Refill:  0   Orders Placed This Encounter  Procedures  . HIV antibody (with reflex)  . Hepatitis B surface antigen  . RPR  . Hepatitis C antibody  . CBC with Differential/Platelet  . Comprehensive metabolic panel  . TSH  . Cholesterol, total  . Triglycerides  . HDL cholesterol  . Prolactin  . Testosterone, Free, Total, SHBG  . 17-Hydroxyprogesterone  . Progesterone  . Ambulatory referral to Psychology    Referral Priority:  Routine    Referral Type:  Psychiatric    Referral Reason:  Specialty Services Required    Requested Specialty:  Psychology    Number of Visits Requested:  1  . POCT urine pregnancy

## 2014-09-08 NOTE — Addendum Note (Signed)
Addended by: Carole Binning on: 09/08/2014 04:16 PM   Modules accepted: Orders

## 2014-09-09 LAB — PAP IG W/ RFLX HPV ASCU

## 2014-09-09 LAB — TESTOSTERONE, FREE, TOTAL, SHBG
Sex Hormone Binding: 53 nmol/L (ref 17–124)
Testosterone, Free: 6.5 pg/mL (ref 0.6–6.8)
Testosterone-% Free: 1.3 % (ref 0.4–2.4)
Testosterone: 49 ng/dL (ref 10–70)

## 2014-09-09 LAB — PROLACTIN: Prolactin: 6.4 ng/mL

## 2014-09-09 LAB — RPR

## 2014-09-09 LAB — PROGESTERONE: Progesterone: 7.3 ng/mL

## 2014-09-10 LAB — SURESWAB, VAGINOSIS/VAGINITIS PLUS
ATOPOBIUM VAGINAE: 6.4 Log (cells/mL)
BV CATEGORY: UNDETERMINED — AB
C. ALBICANS, DNA: DETECTED — AB
C. glabrata, DNA: NOT DETECTED
C. parapsilosis, DNA: DETECTED — AB
C. trachomatis RNA, TMA: NOT DETECTED
C. tropicalis, DNA: NOT DETECTED
Gardnerella vaginalis: >8 Log (cells/mL)
LACTOBACILLUS SPECIES: 6 Log (cells/mL)
MEGASPHAERA SPECIES: NOT DETECTED Log (cells/mL)
N. gonorrhoeae RNA, TMA: NOT DETECTED
T. VAGINALIS RNA, QL TMA: NOT DETECTED

## 2014-09-11 ENCOUNTER — Other Ambulatory Visit: Payer: Self-pay | Admitting: Certified Nurse Midwife

## 2014-09-11 ENCOUNTER — Telehealth: Payer: Self-pay

## 2014-09-11 MED ORDER — CLINDAMYCIN PHOSPHATE 1 % EX GEL: CUTANEOUS | Status: DC

## 2014-09-11 NOTE — Telephone Encounter (Signed)
Amy Dougherty takes medicaid - they take walk-ins first appt - gave patient their phone number [phone] to call them

## 2014-09-12 LAB — 17-HYDROXYPROGESTERONE: 17-OH-Progesterone, LC/MS/MS: 220 ng/dL

## 2014-09-30 ENCOUNTER — Telehealth: Payer: Self-pay | Admitting: *Deleted

## 2014-09-30 NOTE — Telephone Encounter (Signed)
Patient is interested in the [facility] for contraception.  Attempted to contact the patient and left message for patient to call the office.

## 2014-10-01 ENCOUNTER — Encounter: Payer: Self-pay | Admitting: *Deleted

## 2014-10-13 NOTE — Telephone Encounter (Signed)
Attempted to contact the patient and left message for patient to call the office.  

## 2014-10-14 ENCOUNTER — Telehealth: Payer: Self-pay

## 2014-10-14 NOTE — Telephone Encounter (Signed)
Letter with lab results dated 8/24 and 8/25 were returned to [facility] as undeliverable as addresed

## 2014-10-15 NOTE — Telephone Encounter (Signed)
Patient returned call to the office. Attempted to return the call and left message for patient to call the office.

## 2014-10-27 ENCOUNTER — Other Ambulatory Visit: Payer: Self-pay | Admitting: *Deleted

## 2014-10-27 DIAGNOSIS — N760 Acute vaginitis: Principal | ICD-10-CM

## 2014-10-27 DIAGNOSIS — B9689 Other specified bacterial agents as the cause of diseases classified elsewhere: Secondary | ICD-10-CM

## 2014-12-27 ENCOUNTER — Inpatient Hospital Stay (HOSPITAL_COMMUNITY): Payer: Medicaid Other

## 2014-12-27 ENCOUNTER — Inpatient Hospital Stay (HOSPITAL_COMMUNITY): Payer: Medicaid Other | Admitting: Certified Registered Nurse Anesthetist

## 2014-12-27 ENCOUNTER — Inpatient Hospital Stay (HOSPITAL_COMMUNITY)
Admission: AD | Admit: 2014-12-27 | Discharge: 2014-12-27 | Disposition: A | Discharge status: Home / Self Care | Payer: Medicaid Other | Source: Ambulatory Visit | Attending: Obstetrics | Admitting: Obstetrics

## 2014-12-27 ENCOUNTER — Encounter (HOSPITAL_COMMUNITY): Payer: Self-pay

## 2014-12-27 DIAGNOSIS — O4691 Antepartum hemorrhage, unspecified, first trimester: Secondary | ICD-10-CM

## 2014-12-27 DIAGNOSIS — R000 Tachycardia, unspecified: Secondary | ICD-10-CM | POA: Diagnosis not present

## 2014-12-27 DIAGNOSIS — Z3A01 Less than 8 weeks gestation of pregnancy: Secondary | ICD-10-CM | POA: Insufficient documentation

## 2014-12-27 DIAGNOSIS — N939 Abnormal uterine and vaginal bleeding, unspecified: Secondary | ICD-10-CM | POA: Diagnosis present

## 2014-12-27 DIAGNOSIS — O209 Hemorrhage in early pregnancy, unspecified: Secondary | ICD-10-CM

## 2014-12-27 DIAGNOSIS — R42 Dizziness and giddiness: Secondary | ICD-10-CM | POA: Diagnosis not present

## 2014-12-27 DIAGNOSIS — D62 Acute posthemorrhagic anemia: Secondary | ICD-10-CM

## 2014-12-27 LAB — WET PREP, GENITAL
Clue Cells Wet Prep HPF POC: NONE SEEN
Sperm: NONE SEEN
Trich, Wet Prep: NONE SEEN
WBC WET PREP: NONE SEEN
Yeast Wet Prep HPF POC: NONE SEEN

## 2014-12-27 LAB — URINE MICROSCOPIC-ADD ON
SQUAMOUS EPITHELIAL / LPF: NONE SEEN
WBC UA: NONE SEEN WBC/hpf (ref 0–5)

## 2014-12-27 LAB — URINALYSIS, ROUTINE W REFLEX MICROSCOPIC
Bilirubin Urine: NEGATIVE
GLUCOSE, UA: NEGATIVE mg/dL
Ketones, ur: 15 mg/dL — AB
Nitrite: POSITIVE — AB
Protein, ur: >300 mg/dL — AB
Specific Gravity, Urine: 1.025 (ref 1.005–1.030)
pH: 5 (ref 5.0–8.0)

## 2014-12-27 LAB — CBC
HCT: 26.6 % — ABNORMAL LOW (ref 36.0–46.0)
Hemoglobin: 8.6 g/dL — ABNORMAL LOW (ref 12.0–15.0)
MCH: 26.3 pg (ref 26.0–34.0)
MCHC: 32.3 g/dL (ref 30.0–36.0)
MCV: 81.3 fL (ref 78.0–100.0)
Platelets: 313 10*3/uL (ref 150–400)
RBC: 3.27 MIL/uL — ABNORMAL LOW (ref 3.87–5.11)
RDW: 13.8 % (ref 11.5–15.5)
WBC: 7.9 10*3/uL (ref 4.0–10.5)

## 2014-12-27 LAB — HCG, QUANTITATIVE, PREGNANCY: HCG, BETA CHAIN, QUANT, S: 639 m[IU]/mL — AB (ref <5)

## 2014-12-27 LAB — POCT PREGNANCY, URINE: PREG TEST UR: POSITIVE — AB

## 2014-12-27 MED ORDER — LACTATED RINGERS IV BOLUS (SEPSIS)
1000.0000 mL | Freq: Once | INTRAVENOUS | Status: AC
  Administered 2014-12-27: 1000 mL via INTRAVENOUS

## 2014-12-27 MED ORDER — FERROUS SULFATE 325 (65 FE) MG PO TABS: 325.0000 mg | ORAL_TABLET | Freq: Two times a day (BID) | ORAL | Status: DC

## 2014-12-27 MED ORDER — METHYLERGONOVINE MALEATE 0.2 MG PO TABS
0.2000 mg | ORAL_TABLET | Freq: Three times a day (TID) | ORAL | Status: DC
Start: 2014-12-27 — End: 2016-06-08

## 2014-12-27 NOTE — MAU Provider Note (Addendum)
Chief Complaint: Vaginal Bleeding   First Provider Initiated Contact with Patient 12/27/14 [phone]      SUBJECTIVE HPI: Amy Dougherty is a 19 y.o. G2P2002 at Unknown by LMP who presents to maternity admissions reporting onset of heavy vaginal bleeding 4 days ago, reporting she soaks a pad within a few minutes if standing, over 2 hours if not. The bleeding pattern has not changed since it started 4 days ago.  It does include large size clots.  She came to MAU because the bleeding continued and she developed dizziness early this morning.  She has history of anemia.  She is currently sexually active and is not using birth control.  She has irregular menses with LMP in early November, around 3 weeks ago.  She denies abdominal pain.   She denies vaginal itching/burning, urinary symptoms, h/a, n/v, or fever/chills.     Vaginal Bleeding The patient's primary symptoms include vaginal bleeding. The patient's pertinent negatives include no pelvic pain or vaginal discharge. This is a new problem. The current episode started in the past 7 days. The problem occurs constantly. The problem has been unchanged. The patient is experiencing no pain. She is pregnant. Pertinent negatives include no abdominal pain, chills, constipation, diarrhea, dysuria, fever, flank pain, frequency, headaches, nausea, urgency or vomiting. The vaginal bleeding is heavier than menses. She has been passing clots. The symptoms are aggravated by activity. She has tried nothing for the symptoms. She is sexually active. She uses nothing for contraception. Her menstrual history has been irregular.    Past Medical History  Diagnosis Date  . History of blood clots     Blood clot in  Right overy after delivery  . Wisdom teeth extracted   . Herpes genitalis in women     currently taking medications   Past Surgical History  Procedure Laterality Date  . Wisdom tooth extraction     Social History   Social History  . Marital Status: Single     Spouse Name: N/A  . Number of Children: N/A  . Years of Education: N/A   Occupational History  . Not on file.   Social History Main Topics  . Smoking status: Former Research scientist (life sciences)  . Smokeless tobacco: Never Used  . Alcohol Use: 0.0 oz/week    0 Standard drinks or equivalent per week     Comment: Pt reports drinking wine and beer at least 3-4 days per week. Pt reports increased used over the past couple of months.  . Drug Use: No  . Sexual Activity:    Partners: Male    Birth Control/ Protection: Condom   Other Topics Concern  . Not on file   Social History Narrative   No current facility-administered medications on file prior to encounter.   Current Outpatient Prescriptions on File Prior to Encounter  Medication Sig Dispense Refill  . azithromycin (ZITHROMAX) 250 MG tablet Take 4 tablets at once now. (Patient not taking: Reported on 12/27/2014) 4 tablet 0  . buPROPion (WELLBUTRIN SR) 150 MG 12 hr tablet Take 1 tablet (150 mg total) by mouth 2 (two) times daily. (Patient not taking: Reported on 12/27/2014) 60 tablet 6  . cefixime (SUPRAX) 400 MG tablet Take 1 tablet (400 mg total) by mouth daily. (Patient not taking: Reported on 09/08/2014) 1 tablet 0  . clindamycin (CLEOCIN) 300 MG capsule Take 1 capsule (300 mg total) by mouth 2 (two) times daily. (Patient not taking: Reported on 03/13/2014) 14 capsule 0  . clindamycin (CLEOCIN) 300 MG  capsule Take 1 capsule (300 mg total) by mouth 3 (three) times daily. (Patient not taking: Reported on 09/08/2014) 21 capsule 2  . clindamycin (CLINDAGEL) 1 % gel Apply to affected area 2 times daily (Patient not taking: Reported on 12/27/2014) 30 g 0  . doxycycline (VIBRA-TABS) 100 MG tablet Take 1 tablet (100 mg total) by mouth every 12 (twelve) hours. Take every twelve hours until finished for treatment of infection. (Patient not taking: Reported on 03/13/2014) 3 tablet 0  . fluconazole (DIFLUCAN) 100 MG tablet Take 1 tablet (100 mg total) by mouth once.  Repeat dose in 48-72 hour. (Patient not taking: Reported on 12/27/2014) 3 tablet 0  . naproxen (NAPROSYN) 500 MG tablet Take 1 tablet (500 mg total) by mouth 2 (two) times daily. (Patient not taking: Reported on 03/13/2014) 30 tablet 0  . norgestimate-ethinyl estradiol (ORTHO-CYCLEN,SPRINTEC,PREVIFEM) 0.25-35 MG-MCG tablet Take 1 tablet by mouth daily. (Patient not taking: Reported on 09/08/2014) 1 Package 11  . Prenatal Vit-Fe Fumarate-FA (PNV PRENATAL PLUS MULTIVITAMIN) 27-1 MG TABS Take 1 tablet by mouth daily before breakfast. (Patient not taking: Reported on 09/08/2014) 30 tablet 11  . risperiDONE (RISPERDAL) 0.5 MG tablet Take 1 tablet (0.5 mg total) by mouth 2 (two) times daily. (Patient not taking: Reported on 03/13/2014) 60 tablet 0  . risperiDONE (RISPERDAL) 2 MG tablet Take 1 tablet (2 mg total) by mouth at bedtime. (Patient not taking: Reported on 09/08/2014) 30 tablet 0  . sertraline (ZOLOFT) 100 MG tablet Take 1 tablet (100 mg total) by mouth daily. (Patient not taking: Reported on 03/13/2014) 30 tablet 0  . valACYclovir (VALTREX) 500 MG tablet Take 1 tablet (500 mg total) by mouth daily. (Patient taking differently: Take 500 mg by mouth as needed (outbreak). ) 30 tablet 0  . [DISCONTINUED] cetirizine (ZYRTEC) 10 MG tablet Take 10 mg by mouth daily as needed. For cold symptoms     Allergies  Allergen Reactions  . Flagyl [Metronidazole] Rash  . Apple Swelling  . Ibuprofen Other (See Comments)    Blood thinner medications  . Norethindrone Other (See Comments)    headaches  . Other Itching    Tree nuts    ROS:  Review of Systems  Constitutional: Negative for fever, chills and fatigue.  HENT: Negative for sinus pressure.   Eyes: Negative for photophobia.  Respiratory: Negative for shortness of breath.   Cardiovascular: Negative for chest pain.  Gastrointestinal: Negative for nausea, vomiting, abdominal pain, diarrhea and constipation.  Genitourinary: Positive for vaginal bleeding.  Negative for dysuria, urgency, frequency, flank pain, vaginal discharge, difficulty urinating, vaginal pain and pelvic pain.  Musculoskeletal: Negative for neck pain.  Neurological: Negative for dizziness, weakness and headaches.  Psychiatric/Behavioral: Negative.      I have reviewed patient's Past Medical Hx, Surgical Hx, Family Hx, Social Hx, medications and allergies.   Physical Exam   Patient Vitals for the past 24 hrs:  BP Temp Pulse Resp Height Weight  12/27/14 0757 132/67 mmHg - 87 16 - -  12/27/14 0515 133/83 mmHg 98.1 F (36.7 C) 99 18 5\' 7"  (1.702 m) 179 lb 6.4 oz (81.375 kg)   Constitutional: Well-developed, well-nourished female in no acute distress.  Cardiovascular: normal rate Respiratory: normal effort GI: Abd soft, non-tender. Pos BS x 4 MS: Extremities nontender, no edema, normal ROM Neurologic: Alert and oriented x 4.  GU: Neg CVAT.  PELVIC EXAM: Cervix pink, visually open 1-2 cm, without lesion, moderate amount dark red bleeding with small clots removed during exam,  vaginal walls and external genitalia normal Bimanual exam: Cervix 0/long/high, firm, anterior, neg CMT, uterus nontender, nonenlarged, adnexa without tenderness, enlargement, or mass   LAB RESULTS Results for orders placed or performed during the hospital encounter of 12/27/14 (from the past 24 hour(s))  Urinalysis, Routine w reflex microscopic (not at [facility])     Status: Abnormal   Collection Time: 12/27/14  5:25 AM  Result Value Ref Range   Color, Urine RED (A) YELLOW   APPearance TURBID (A) CLEAR   Specific Gravity, Urine 1.025 1.005 - 1.030   pH 5.0 5.0 - 8.0   Glucose, UA NEGATIVE NEGATIVE mg/dL   Hgb urine dipstick LARGE (A) NEGATIVE   Bilirubin Urine NEGATIVE NEGATIVE   Ketones, ur 15 (A) NEGATIVE mg/dL   Protein, ur >300 (A) NEGATIVE mg/dL   Nitrite POSITIVE (A) NEGATIVE   Leukocytes, UA TRACE (A) NEGATIVE  Urine microscopic-add on     Status: Abnormal   Collection Time: 12/27/14   5:25 AM  Result Value Ref Range   Squamous Epithelial / LPF NONE SEEN NONE SEEN   WBC, UA NONE SEEN 0 - 5 WBC/hpf   RBC / HPF TOO NUMEROUS TO COUNT 0 - 5 RBC/hpf   Bacteria, UA RARE (A) NONE SEEN  Pregnancy, urine POC     Status: Abnormal   Collection Time: 12/27/14  6:04 AM  Result Value Ref Range   Preg Test, Ur POSITIVE (A) NEGATIVE  CBC     Status: Abnormal   Collection Time: 12/27/14  6:05 AM  Result Value Ref Range   WBC 7.9 4.0 - 10.5 K/uL   RBC 3.27 (L) 3.87 - 5.11 MIL/uL   Hemoglobin 8.6 (L) 12.0 - 15.0 g/dL   HCT 26.6 (L) 36.0 - 46.0 %   MCV 81.3 78.0 - 100.0 fL   MCH 26.3 26.0 - 34.0 pg   MCHC 32.3 30.0 - 36.0 g/dL   RDW 13.8 11.5 - 15.5 %   Platelets 313 150 - 400 K/uL  hCG, quantitative, pregnancy     Status: Abnormal   Collection Time: 12/27/14  6:05 AM  Result Value Ref Range   hCG, Beta Chain, Quant, S 639 (H) <5 mIU/mL  Wet prep, genital     Status: None   Collection Time: 12/27/14  6:35 AM  Result Value Ref Range   Yeast Wet Prep HPF POC NONE SEEN NONE SEEN   Trich, Wet Prep NONE SEEN NONE SEEN   Clue Cells Wet Prep HPF POC NONE SEEN NONE SEEN   WBC, Wet Prep HPF POC NONE SEEN NONE SEEN   Sperm NONE SEEN        IMAGING US Ob Comp Less 14 Wks  12/27/2014  CLINICAL DATA:  Vaginal bleeding in early pregnancy. EXAM: OBSTETRIC <14 WK [place] AND TRANSVAGINAL OB US TECHNIQUE: Both transabdominal and transvaginal ultrasound examinations were performed for complete evaluation of the gestation as well as the maternal uterus, adnexal regions, and pelvic cul-de-sac. Transvaginal technique was performed to assess early pregnancy. COMPARISON:  None. FINDINGS: Intrauterine gestational sac: No Yolk sac:  No Embryo:  No Cardiac Activity: No Maternal uterus/adnexae: The endometrium is thickened and complex with some clot in the cervix and vaginal canal. Ovaries are normal. No adnexal masses. IMPRESSION: No evidence of pregnancy. Thickened complex endometrium with clot in the  cervix and vaginal canal. Electronically Signed   By: Lorriane Shire M.D.   On: 12/27/2014 07:39   US Ob Transvaginal  12/27/2014  CLINICAL DATA:  Vaginal  bleeding in early pregnancy. EXAM: OBSTETRIC <14 WK [place] AND TRANSVAGINAL OB US TECHNIQUE: Both transabdominal and transvaginal ultrasound examinations were performed for complete evaluation of the gestation as well as the maternal uterus, adnexal regions, and pelvic cul-de-sac. Transvaginal technique was performed to assess early pregnancy. COMPARISON:  None. FINDINGS: Intrauterine gestational sac: No Yolk sac:  No Embryo:  No Cardiac Activity: No Maternal uterus/adnexae: The endometrium is thickened and complex with some clot in the cervix and vaginal canal. Ovaries are normal. No adnexal masses. IMPRESSION: No evidence of pregnancy. Thickened complex endometrium with clot in the cervix and vaginal canal. Electronically Signed   By: Lorriane Shire M.D.   On: 12/27/2014 07:39    MAU Management/MDM: Ordered labs and reviewed results.  Pt dizzy in MAU, so ultrasound ordered to be completed at bedside.  Treatments in MAU included LR x1000.   Report to Hansel Feinstein, CNM     Fatima Blank Certified Nurse-Midwife 12/27/2014  8:05 AM  Assumed care.  Patient finished her liter of IV Fluids and felt better Was able to ambulate without dizziness Filed Vitals:   12/27/14 0757 12/27/14 0917  BP: 132/67 105/69  Pulse: 87 105  Temp:  98.1 F (36.7 C)  Resp: 16 16   Tachycardia resolved Bleeding moderate, no active heavy bleeding Will discharge home with Methergine and Iron supplements Follow up in MAU in 2 days for HCG level  Seabron Spates, CNM   Medication List    STOP taking these medications        azithromycin 250 MG tablet  Commonly known as:  ZITHROMAX     buPROPion 150 MG 12 hr tablet  Commonly known as:  WELLBUTRIN SR     cefixime 400 MG tablet  Commonly known as:  SUPRAX     clindamycin 1 % gel  Commonly  known as:  CLINDAGEL     clindamycin 300 MG capsule  Commonly known as:  CLEOCIN     doxycycline 100 MG tablet  Commonly known as:  VIBRA-TABS     fluconazole 100 MG tablet  Commonly known as:  DIFLUCAN     naproxen 500 MG tablet  Commonly known as:  NAPROSYN     norgestimate-ethinyl estradiol 0.25-35 MG-MCG tablet  Commonly known as:  ORTHO-CYCLEN,SPRINTEC,PREVIFEM     PNV PRENATAL PLUS MULTIVITAMIN 27-1 MG Tabs     risperiDONE 0.5 MG tablet  Commonly known as:  RISPERDAL     risperiDONE 2 MG tablet  Commonly known as:  RISPERDAL     sertraline 100 MG tablet  Commonly known as:  ZOLOFT      TAKE these medications        acetaminophen 500 MG tablet  Commonly known as:  TYLENOL  Take 500 mg by mouth every 6 (six) hours as needed.     ferrous sulfate 325 (65 FE) MG tablet  Commonly known as:  FERROUSUL  Take 1 tablet (325 mg total) by mouth 2 (two) times daily.     methylergonovine 0.2 MG tablet  Commonly known as:  METHERGINE  Take 1 tablet (0.2 mg total) by mouth 3 (three) times daily.     valACYclovir 500 MG tablet  Commonly known as:  VALTREX  Take 1 tablet (500 mg total) by mouth daily.

## 2014-12-27 NOTE — MAU Note (Addendum)
LMP 11/16. Period started Weds and had some clots. Bleeding and clots have increased. Have been laying down but when i stand blood goes thru my clothes. Used 2 pads in 2 hrs. No pain. Sometimes feels dizzy and faint

## 2014-12-27 NOTE — Discharge Instructions (Signed)
Pelvic Rest Pelvic rest is sometimes recommended for women when:   The placenta is partially or completely covering the opening of the cervix (placenta previa).  There is bleeding between the uterine wall and the amniotic sac in the first trimester (subchorionic hemorrhage).  The cervix begins to open without labor starting (incompetent cervix, cervical insufficiency).  The labor is too early (preterm labor). HOME CARE INSTRUCTIONS  Do not have sexual intercourse, stimulation, or an orgasm.  Do not use tampons, douche, or put anything in the vagina.  Do not lift anything over 10 pounds (4.5 kg).  Avoid strenuous activity or straining your pelvic muscles. SEEK MEDICAL CARE IF:  You have any vaginal bleeding during pregnancy. Treat this as a potential emergency.  You have cramping pain felt low in the stomach (stronger than menstrual cramps).  You notice vaginal discharge (watery, mucus, or bloody).  You have a low, dull backache.  There are regular contractions or uterine tightening. SEEK IMMEDIATE MEDICAL CARE IF: You have vaginal bleeding and have placenta previa.    This information is not intended to replace advice given to you by your health care provider. Make sure you discuss any questions you have with your health care provider.   Document Released: 05/20/2010 Document Revised: 04/17/2011 Document Reviewed: 07/27/2014 Elsevier Interactive Patient Education 2016 Elsevier Inc. Vaginal Bleeding During Pregnancy, First Trimester A small amount of bleeding (spotting) from the vagina is relatively common in early pregnancy. It usually stops on its own. Various things may cause bleeding or spotting in early pregnancy. Some bleeding may be related to the pregnancy, and some may not. In most cases, the bleeding is normal and is not a problem. However, bleeding can also be a sign of something serious. Be sure to tell your health care provider about any vaginal bleeding right  away. Some possible causes of vaginal bleeding during the first trimester include:  Infection or inflammation of the cervix.  Growths (polyps) on the cervix.  Miscarriage or threatened miscarriage.  Pregnancy tissue has developed outside of the uterus and in a fallopian tube (tubal pregnancy).  Tiny cysts have developed in the uterus instead of pregnancy tissue (molar pregnancy). HOME CARE INSTRUCTIONS  Watch your condition for any changes. The following actions may help to lessen any discomfort you are feeling:  Follow your health care provider's instructions for limiting your activity. If your health care provider orders bed rest, you may need to stay in bed and only get up to use the bathroom. However, your health care provider may allow you to continue light activity.  If needed, make plans for someone to help with your regular activities and responsibilities while you are on bed rest.  Keep track of the number of pads you use each day, how often you change pads, and how soaked (saturated) they are. Write this down.  Do not use tampons. Do not douche.  Do not have sexual intercourse or orgasms until approved by your health care provider.  If you pass any tissue from your vagina, save the tissue so you can show it to your health care provider.  Only take over-the-counter or prescription medicines as directed by your health care provider.  Do not take aspirin because it can make you bleed.  Keep all follow-up appointments as directed by your health care provider. SEEK MEDICAL CARE IF:  You have any vaginal bleeding during any part of your pregnancy.  You have cramps or labor pains.  You have a fever, not controlled  by medicine. SEEK IMMEDIATE MEDICAL CARE IF:   You have severe cramps in your back or belly (abdomen).  You pass large clots or tissue from your vagina.  Your bleeding increases.  You feel light-headed or weak, or you have fainting episodes.  You have  chills.  You are leaking fluid or have a gush of fluid from your vagina.  You pass out while having a bowel movement. MAKE SURE YOU:  Understand these instructions.  Will watch your condition.  Will get help right away if you are not doing well or get worse.   This information is not intended to replace advice given to you by your health care provider. Make sure you discuss any questions you have with your health care provider.   Document Released: 11/02/2004 Document Revised: 01/28/2013 Document Reviewed: 09/30/2012 Elsevier Interactive Patient Education [employer].

## 2014-12-27 NOTE — Progress Notes (Signed)
Attempted IV twice, called anesthesia to come start IV.

## 2014-12-29 ENCOUNTER — Other Ambulatory Visit: Payer: Self-pay | Admitting: Obstetrics

## 2014-12-30 LAB — GC/CHLAMYDIA PROBE AMP (~~LOC~~) NOT AT ARMC
Chlamydia: NEGATIVE
NEISSERIA GONORRHEA: NEGATIVE

## 2014-12-30 IMAGING — US US OB COMP LESS 14 WK
1 series · 14 of 20 positions shown · non-contrast
Comparison: None.

CLINICAL DATA: cramping in early preg  ?LMP BHCG pending,

OBSTETRIC <14 WK US
TECHNIQUE: Transabdominal ultrasound examination was performed for
complete evaluation of the gestation as well as the maternal
uterus, adnexal regions, and pelvic cul-de-sac.

[Series 1: us ob comp less 14 wks · 20 acquisitions, 14 frames shown]
[im 1/20]
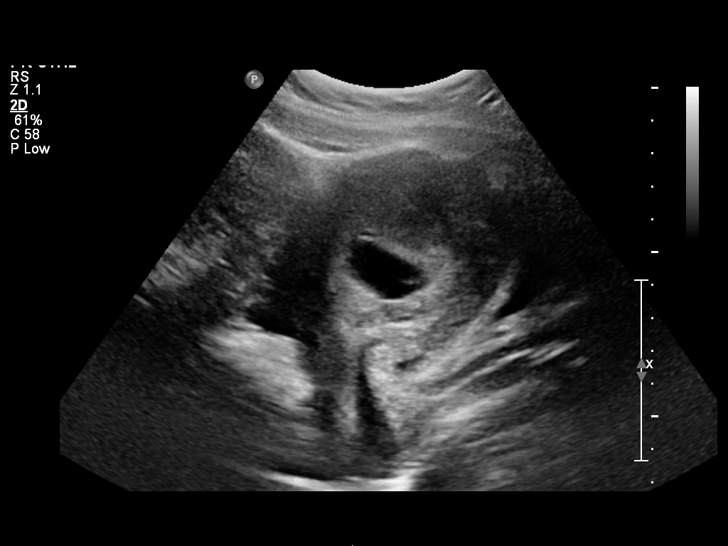
[im 3/20]
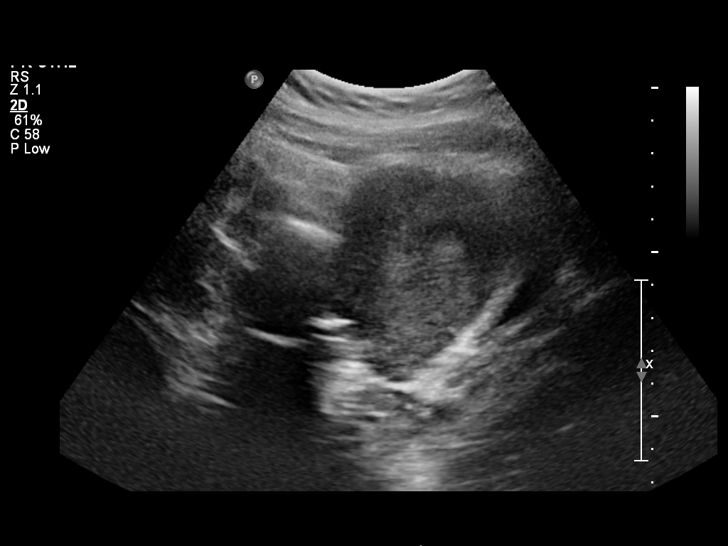
[im 4/20]
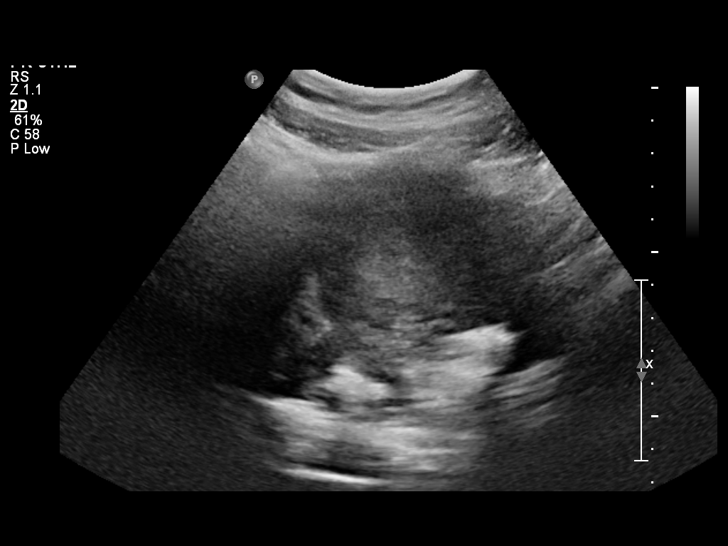
[im 6/20]
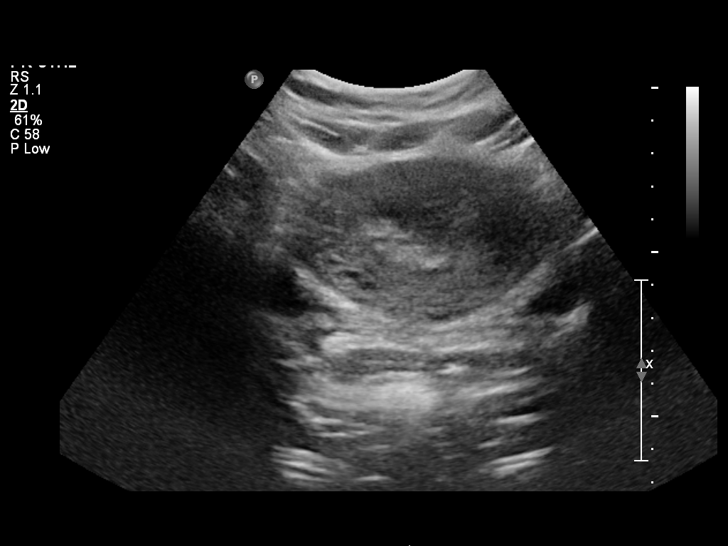
[im 7/20]
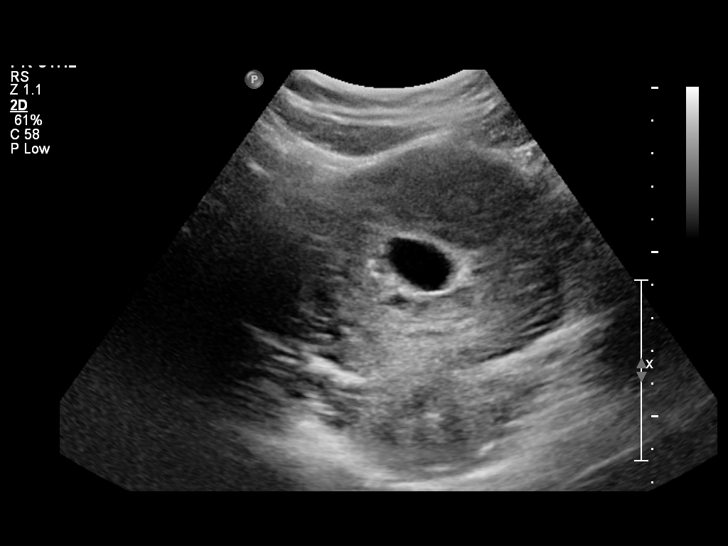
[im 8/20]
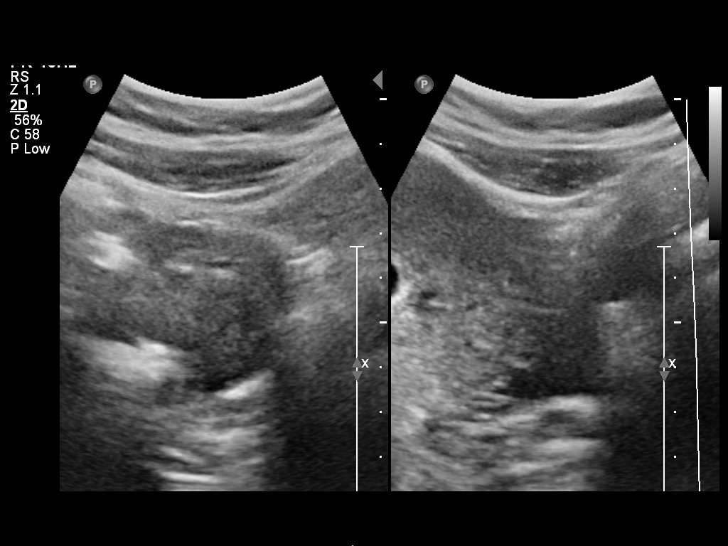
[im 10/20]
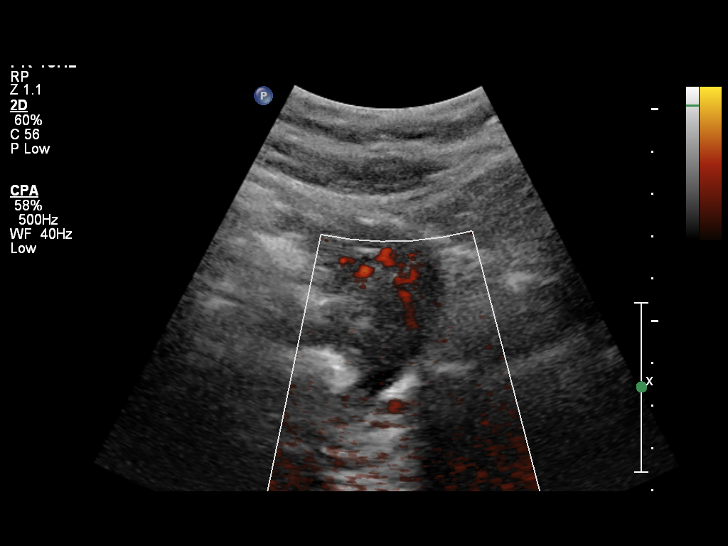
[im 11/20]
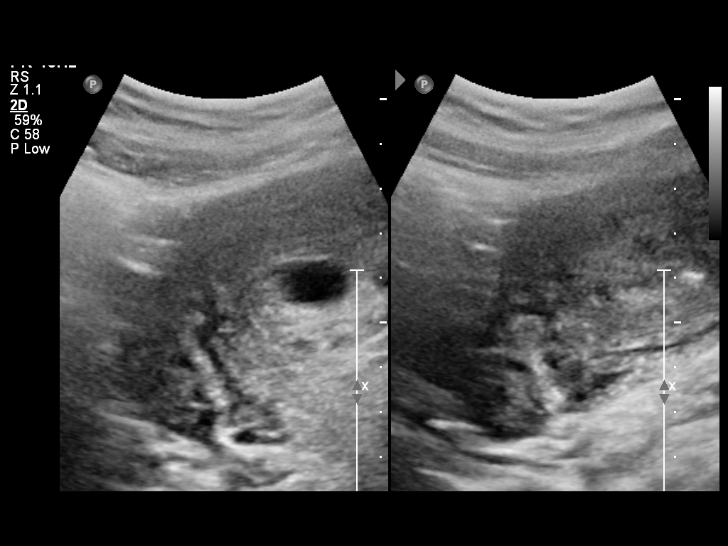
[im 13/20]
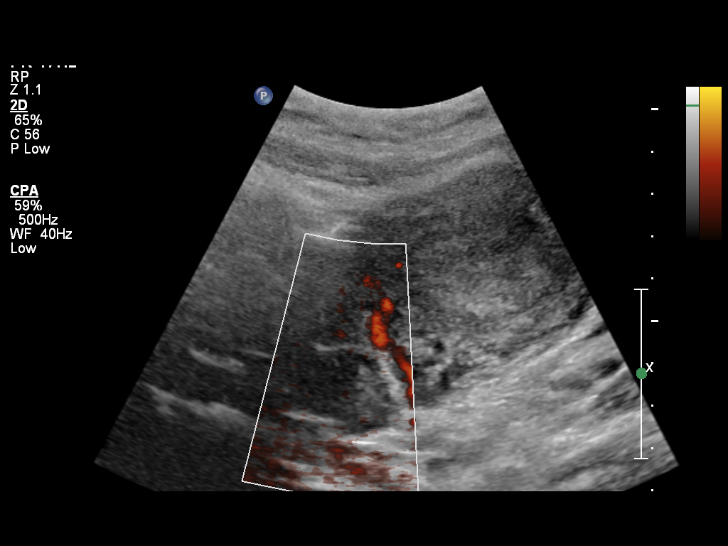
[im 14/20]
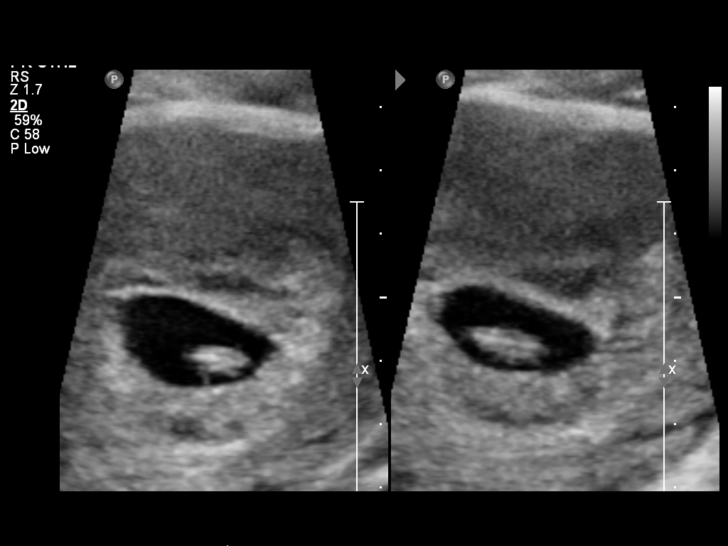
[im 16/20]
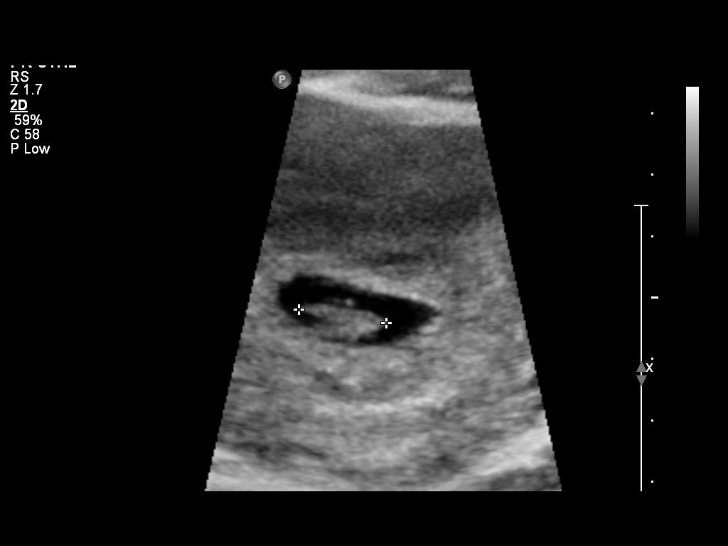
[im 17/20]
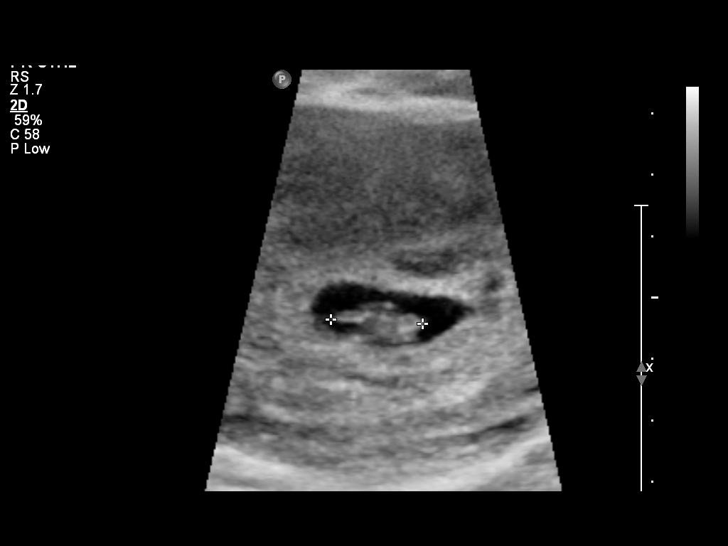
[im 18/20]
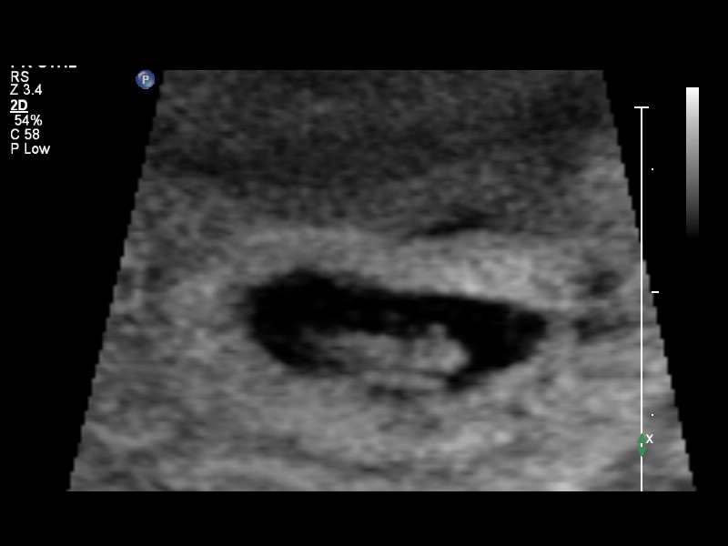
[im 20/20]
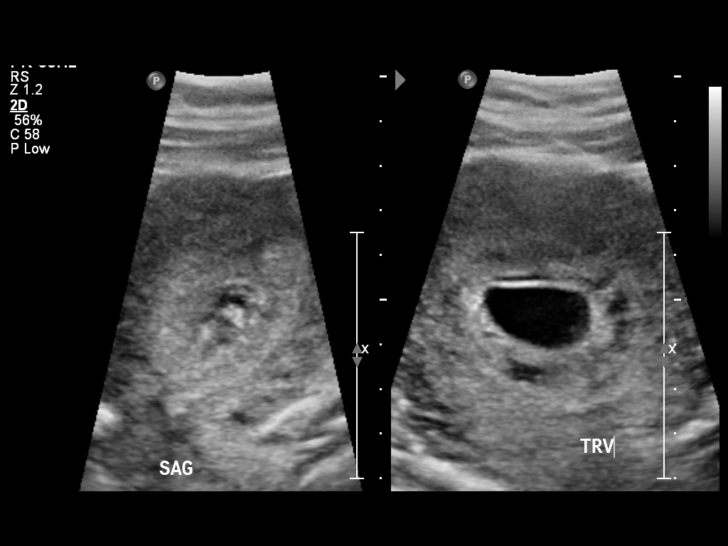

[14 of 20 positions shown; findings below may reference images not displayed]

FINDINGS: There is a this single intrauterine gestation.  Crown-
rump length is 1.48 cm for an estimated gestational age of 7 weeks
6 days.  Small subchorionic hemorrhage.  Fetal heart rate 152 beats
per minute.

Ovaries are symmetric in size and echotexture.  No adnexal masses.
No free fluid.
IMPRESSION: 7-week-3-day intrauterine pregnancy with small subchorionic
hemorrhage.  Fetal heart rate 152 beats per minute.

## 2015-01-22 NOTE — Telephone Encounter (Signed)
Patient has not returned call to the office. Call to be re-filed. 

## 2015-06-02 IMAGING — US US OB TRANSVAGINAL
1 series · 13 of 21 positions shown · non-contrast
Comparison: none

[Series 1: us ob transvaginal · 0.28mm/px · 13 of 21 slices shown]
[im 1/21]
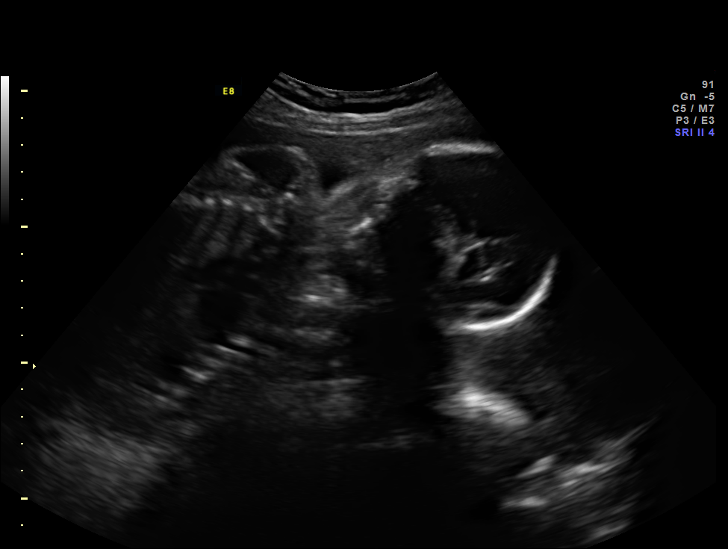
[im 3/21]
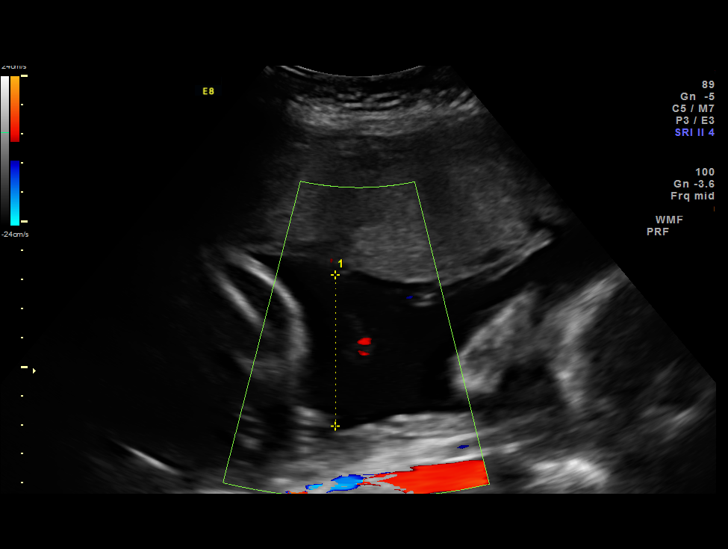
[im 5/21]
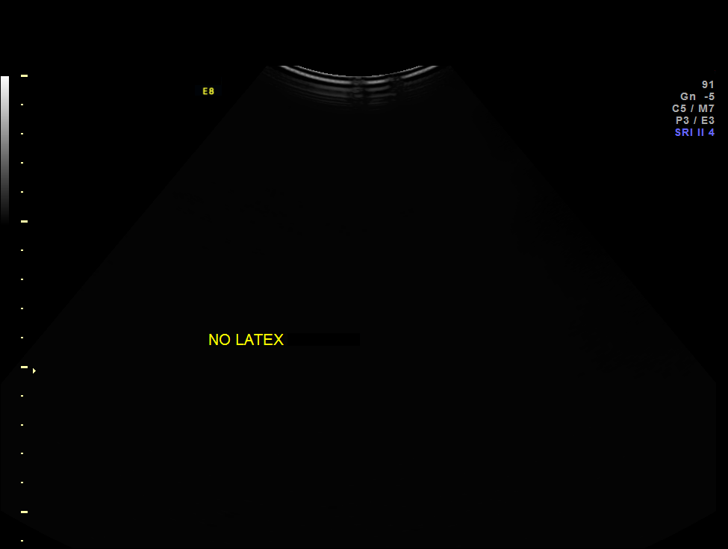
[im 6/21]
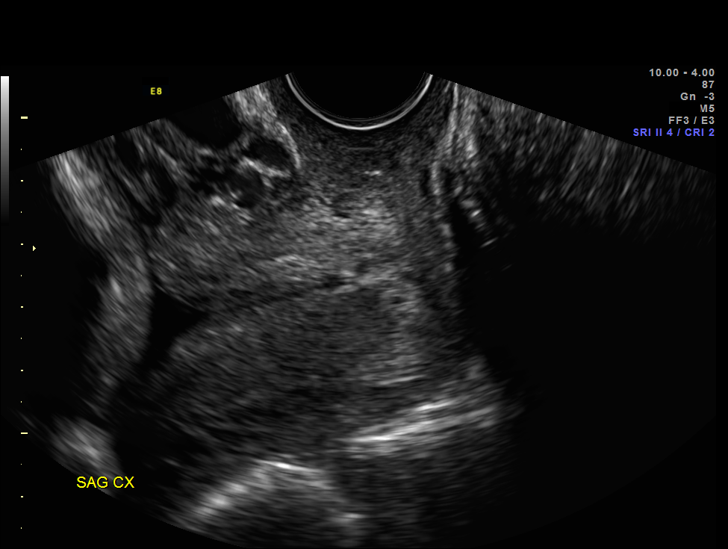
[im 8/21]
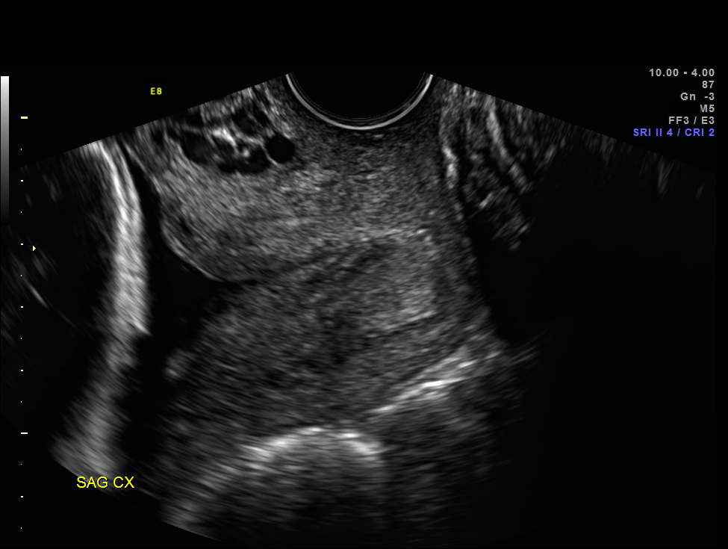
[im 9/21]
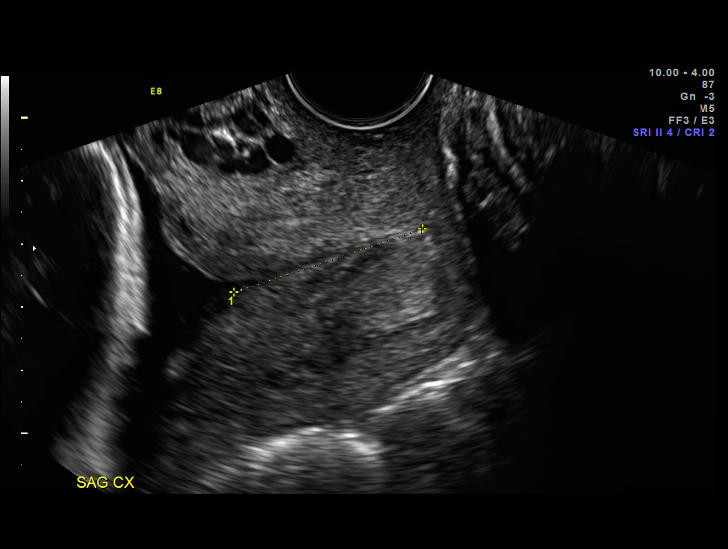
[im 11/21]
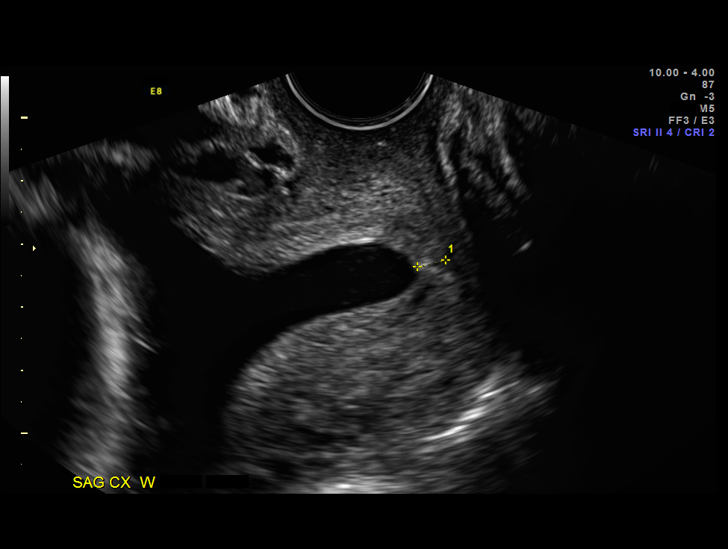
[im 13/21]
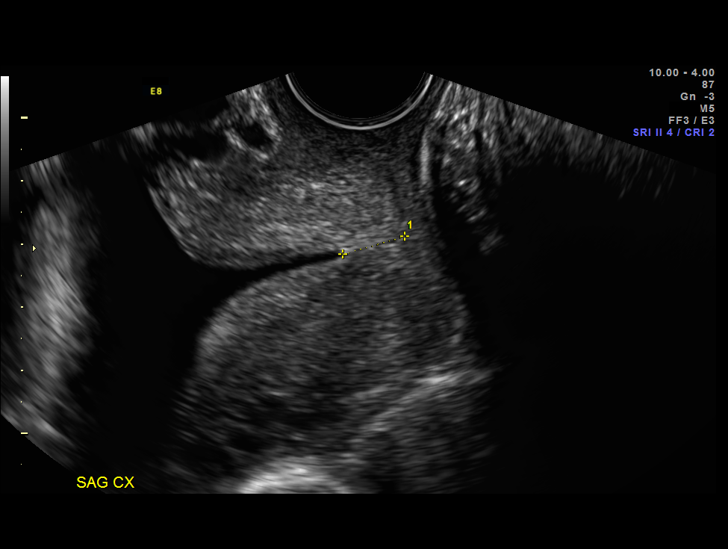
[im 14/21]
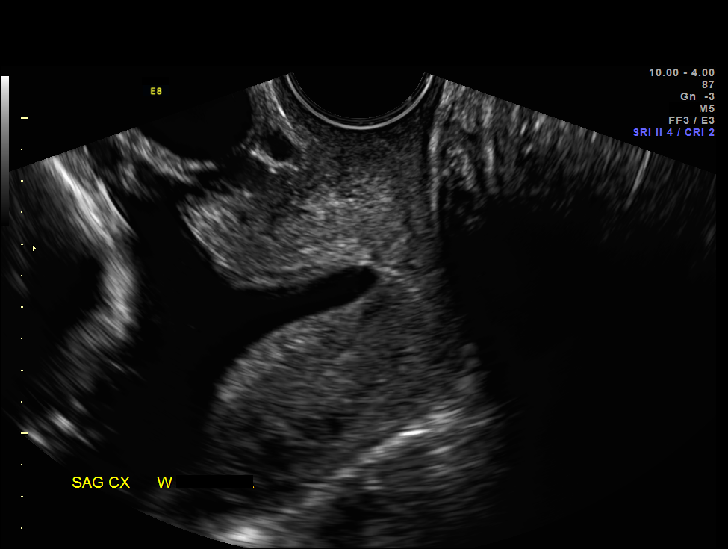
[im 16/21]
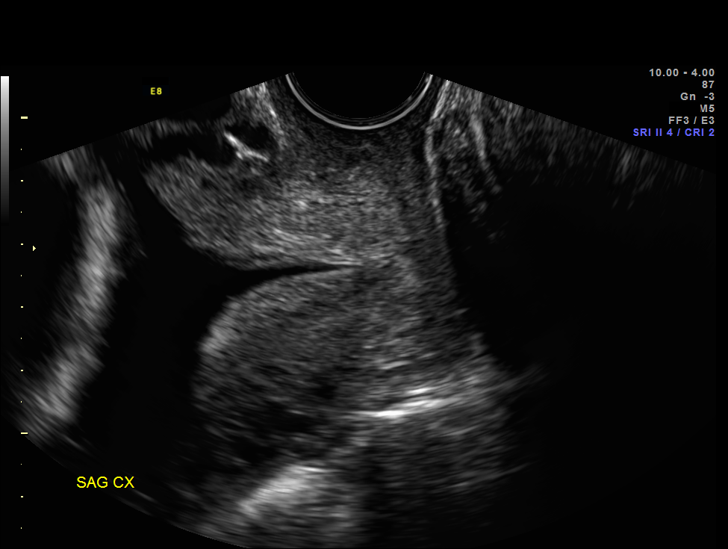
[im 17/21]
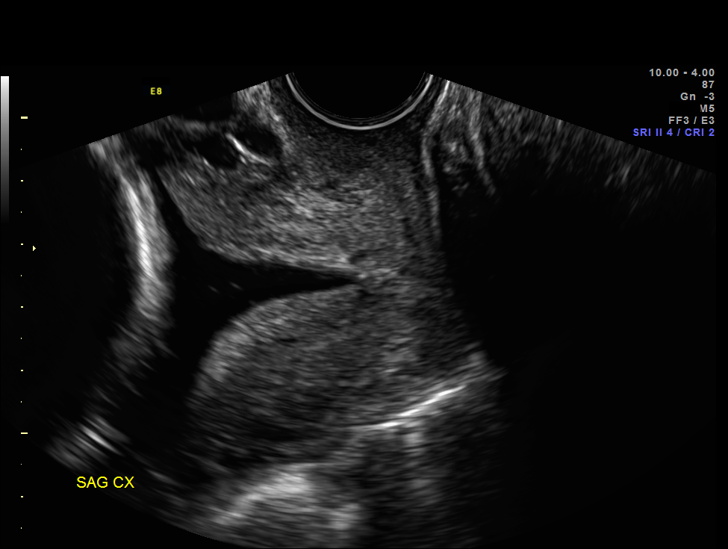
[im 19/21]
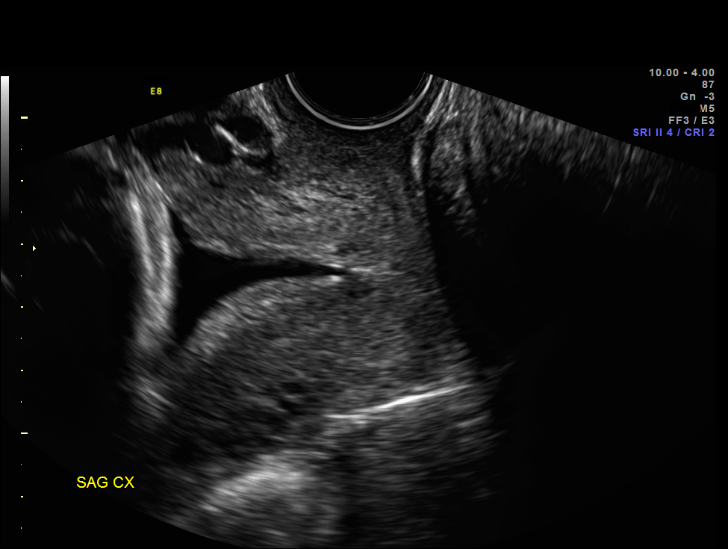
[im 21/21]
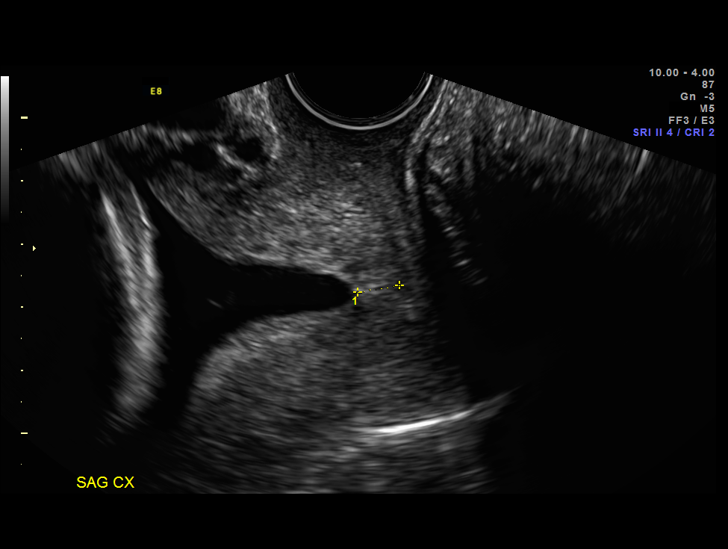

[13 of 21 positions shown; findings below may reference images not displayed]

OBSTETRICS REPORT
                      (Signed Final 08/23/2012 [DATE])

Service(s) Provided

 US OB TRANSVAGINAL                                    76817.0
Indications

 Cervical insufficiency
 Poor obstetrical history (blood clot) on lovenox
Fetal Evaluation

 Num Of Fetuses:    1
 Fetal Heart Rate:  141                          bpm
 Cardiac Activity:  Observed
 Presentation:      Cephalic
 Placenta:          Anterior, above cervical os
 P. Cord            Previously Visualized
 Insertion:

 Amniotic Fluid
 AFI FV:      Subjectively within normal limits
                                             Larg Pckt:     5.2  cm
Gestational Age

 Clinical EDD:  29w 6d                                        EDD:   11/02/12
 Best:          29w 6d     Det. By:  Clinical EDD             EDD:   11/02/12
Cervix Uterus Adnexa

 Cervical Length:    0.5      cm

 Cervix:       Dynamic cervix. Funneling of internal os noted.
Impression

 IUP at 29+6 weeks
 EV views of cervix: dramatic dynamic funneling of the internal
 os; initial cervical length was 3.17 cms; then with fundal
 pressure, there was V-shaped funneling with the distal closed
 portion measuring 
 5 mm.
Recommendations

 Ok for outpatient management
 Consider booster BMZ if it has been a few weeks since her
 intial course
 Follow cervix clinically

 questions or concerns.

## 2015-07-26 ENCOUNTER — Telehealth: Payer: Self-pay | Admitting: *Deleted

## 2015-07-26 NOTE — Telephone Encounter (Signed)
Call not clear 16:45 Attempt to call patient- call not going through- LM on VM when called back to call the office- couldn't get through.

## 2015-07-29 ENCOUNTER — Ambulatory Visit: Payer: Self-pay | Admitting: Obstetrics

## 2015-07-29 NOTE — Telephone Encounter (Signed)
Patient has an appointment.

## 2015-08-09 ENCOUNTER — Ambulatory Visit: Payer: Medicaid Other | Admitting: Obstetrics

## 2015-08-13 ENCOUNTER — Ambulatory Visit: Payer: Medicaid Other | Admitting: Obstetrics

## 2016-06-08 ENCOUNTER — Inpatient Hospital Stay (HOSPITAL_COMMUNITY): Payer: Medicaid Other

## 2016-06-08 ENCOUNTER — Ambulatory Visit (INDEPENDENT_AMBULATORY_CARE_PROVIDER_SITE_OTHER): Payer: Self-pay

## 2016-06-08 ENCOUNTER — Encounter (HOSPITAL_COMMUNITY): Payer: Self-pay

## 2016-06-08 ENCOUNTER — Encounter: Payer: Self-pay | Admitting: *Deleted

## 2016-06-08 ENCOUNTER — Inpatient Hospital Stay (HOSPITAL_COMMUNITY)
Admission: AD | Admit: 2016-06-08 | Discharge: 2016-06-08 | Disposition: A | Discharge status: Home / Self Care | Payer: Medicaid Other | Source: Ambulatory Visit | Attending: Obstetrics and Gynecology | Admitting: Obstetrics and Gynecology

## 2016-06-08 VITALS — BP 133/90 | HR 88 | Wt 189.5 lb

## 2016-06-08 DIAGNOSIS — R109 Unspecified abdominal pain: Secondary | ICD-10-CM | POA: Insufficient documentation

## 2016-06-08 DIAGNOSIS — Z86718 Personal history of other venous thrombosis and embolism: Secondary | ICD-10-CM | POA: Insufficient documentation

## 2016-06-08 DIAGNOSIS — Z3200 Encounter for pregnancy test, result unknown: Secondary | ICD-10-CM

## 2016-06-08 DIAGNOSIS — O208 Other hemorrhage in early pregnancy: Secondary | ICD-10-CM | POA: Diagnosis not present

## 2016-06-08 DIAGNOSIS — Z3201 Encounter for pregnancy test, result positive: Secondary | ICD-10-CM

## 2016-06-08 DIAGNOSIS — O99611 Diseases of the digestive system complicating pregnancy, first trimester: Secondary | ICD-10-CM | POA: Diagnosis not present

## 2016-06-08 DIAGNOSIS — O26891 Other specified pregnancy related conditions, first trimester: Secondary | ICD-10-CM | POA: Diagnosis present

## 2016-06-08 DIAGNOSIS — K219 Gastro-esophageal reflux disease without esophagitis: Secondary | ICD-10-CM | POA: Insufficient documentation

## 2016-06-08 DIAGNOSIS — Z3A01 Less than 8 weeks gestation of pregnancy: Secondary | ICD-10-CM | POA: Diagnosis not present

## 2016-06-08 DIAGNOSIS — O26899 Other specified pregnancy related conditions, unspecified trimester: Secondary | ICD-10-CM

## 2016-06-08 DIAGNOSIS — Z87891 Personal history of nicotine dependence: Secondary | ICD-10-CM | POA: Diagnosis not present

## 2016-06-08 LAB — CBC
HCT: 40.2 % (ref 36.0–46.0)
Hemoglobin: 13.5 g/dL (ref 12.0–15.0)
MCH: 27.4 pg (ref 26.0–34.0)
MCHC: 33.6 g/dL (ref 30.0–36.0)
MCV: 81.7 fL (ref 78.0–100.0)
PLATELETS: 285 10*3/uL (ref 150–400)
RBC: 4.92 MIL/uL (ref 3.87–5.11)
RDW: 13.6 % (ref 11.5–15.5)
WBC: 9 10*3/uL (ref 4.0–10.5)

## 2016-06-08 LAB — URINALYSIS, ROUTINE W REFLEX MICROSCOPIC
BILIRUBIN URINE: NEGATIVE
Glucose, UA: NEGATIVE mg/dL
HGB URINE DIPSTICK: NEGATIVE
KETONES UR: NEGATIVE mg/dL
Leukocytes, UA: NEGATIVE
NITRITE: NEGATIVE
PH: 6 (ref 5.0–8.0)
Protein, ur: NEGATIVE mg/dL
SPECIFIC GRAVITY, URINE: 1.002 — AB (ref 1.005–1.030)

## 2016-06-08 LAB — POCT URINE PREGNANCY: Preg Test, Ur: POSITIVE — AB

## 2016-06-08 LAB — WET PREP, GENITAL
CLUE CELLS WET PREP: NONE SEEN
Trich, Wet Prep: NONE SEEN
Yeast Wet Prep HPF POC: NONE SEEN

## 2016-06-08 LAB — LIPASE, BLOOD: LIPASE: 17 U/L (ref 11–51)

## 2016-06-08 LAB — HCG, QUANTITATIVE, PREGNANCY: HCG, BETA CHAIN, QUANT, S: 55420 m[IU]/mL — AB (ref <5)

## 2016-06-08 MED ORDER — RANITIDINE HCL 150 MG PO TABS: 150.0000 mg | ORAL_TABLET | Freq: Two times a day (BID) | ORAL | 2 refills | Status: DC

## 2016-06-08 MED ORDER — PROMETHAZINE HCL 25 MG PO TABS: 12.5000 mg | ORAL_TABLET | Freq: Four times a day (QID) | ORAL | 2 refills | Status: DC | PRN

## 2016-06-08 MED ORDER — DOXYLAMINE-PYRIDOXINE 10-10 MG PO TBEC: DELAYED_RELEASE_TABLET | ORAL | 5 refills | Status: DC

## 2016-06-08 MED ORDER — PREPLUS 27-1 MG PO TABS: 1.0000 | ORAL_TABLET | Freq: Every day | ORAL | 13 refills | Status: DC

## 2016-06-08 NOTE — MAU Note (Signed)
pt reports  She just got her pregnancy confirmed across the street at [facility]. Started cramping right after she left the office. Denies any vaginal bleeding or discharge.

## 2016-06-08 NOTE — Progress Notes (Signed)
Patient presents for UPT- POSITIVE.  LMP 04/25/2016. EDD12/25/18.  Patient advised to make appointment for PreNatal Care.

## 2016-06-08 NOTE — Discharge Instructions (Signed)
[facility]  ° ° °[facility]       Phone: [phone] ° °[facility] Phone: [phone] ° °[facility] [place]  Phone: [phone] ° °[facility] [place]  Phone: [phone] ° °[facility] [place]  Phone: [phone] ° °[facility]       Phone: [phone] ° °[facility]    Phone: [phone]  ° °[facility]    Phone: [phone] ° °[facility]    Phone: [phone] ° °[facility]    Phone: [phone] ° °[facility]    Phone: [phone] ° °[facility]       Phone: [phone]  ° °[facility]  Phone: [phone] ° °[facility]    Phone: [phone] ° °[facility]   Phone: [phone] ° °[facility]      Phone: [phone] ° °[facility]    Phone: [phone] ° °

## 2016-06-08 NOTE — MAU Provider Note (Signed)
Chief Complaint: Abdominal Pain   First Provider Initiated Contact with Patient 06/08/16 1134      SUBJECTIVE HPI: Amy Dougherty is a 21 y.o. G3P2002 at [redacted]w[redacted]d by LMP who presents to maternity admissions reporting onset of abdominal cramping after her pregnancy verification appointment at [facility] this morning.  She describes the pain as like menstrual cramps, constant, and near her umbilicus. She has not tried anything for pain, nothing makes it better or worse. It is associated with nausea but no vomiting.  She denies vaginal bleeding, vaginal itching/burning, urinary symptoms, h/a, dizziness, n/v, or fever/chills.     HPI  Past Medical History:  Diagnosis Date  . Herpes genitalis in women    currently taking medications  . History of blood clots    Blood clot in  Right overy after delivery  . Wisdom teeth extracted    Past Surgical History:  Procedure Laterality Date  . WISDOM TOOTH EXTRACTION     Social History   Social History  . Marital status: Single    Spouse name: N/A  . Number of children: N/A  . Years of education: N/A   Occupational History  . Not on file.   Social History Main Topics  . Smoking status: Former Research scientist (life sciences)  . Smokeless tobacco: Never Used  . Alcohol use 0.0 oz/week     Comment: Pt reports drinking wine and beer at least 3-4 days per week. Pt reports increased used over the past couple of months.  . Drug use: No  . Sexual activity: Yes    Partners: Male    Birth control/ protection: Condom   Other Topics Concern  . Not on file   Social History Narrative  . No narrative on file   No current facility-administered medications on file prior to encounter.    Current Outpatient Prescriptions on File Prior to Encounter  Medication Sig Dispense Refill  . diphenhydrAMINE (BENADRYL) 25 MG tablet Take 25 mg by mouth every 6 (six) hours as needed.    Marland Kitchen acetaminophen (TYLENOL) 500 MG tablet Take 500 mg by mouth every 6 (six) hours as needed.    . Prenatal  Vit-Fe Fumarate-FA (PREPLUS) 27-1 MG TABS Take 1 tablet by mouth daily. (Patient not taking: Reported on 06/08/2016) 30 tablet 13  . [DISCONTINUED] cetirizine (ZYRTEC) 10 MG tablet Take 10 mg by mouth daily as needed. For cold symptoms     Allergies  Allergen Reactions  . Flagyl [Metronidazole] Rash  . Apple Swelling  . Cherry Itching  . Ibuprofen Other (See Comments)    Blood thinner medications  . Norethindrone Other (See Comments)    headaches  . Other Itching    Tree nuts    ROS:  Review of Systems  Constitutional: Negative for chills, fatigue and fever.  Respiratory: Negative for shortness of breath.   Cardiovascular: Negative for chest pain.  Gastrointestinal: Positive for nausea. Negative for vomiting.  Genitourinary: Positive for pelvic pain. Negative for difficulty urinating, dysuria, flank pain, vaginal bleeding, vaginal discharge and vaginal pain.  Neurological: Negative for dizziness and headaches.  Psychiatric/Behavioral: Negative.      I have reviewed patient's Past Medical Hx, Surgical Hx, Family Hx, Social Hx, medications and allergies.   Physical Exam   Patient Vitals for the past 24 hrs:  BP Temp Temp src Pulse Resp Height Weight  06/08/16 1350 139/77 - - 81 16 - -  06/08/16 1045 (!) 141/78 98.3 F (36.8 C) Oral 82 18 5\' 7"  (1.702 m) 189 lb  1.3 oz (85.8 kg)   Constitutional: Well-developed, well-nourished female in no acute distress.  Cardiovascular: normal rate Respiratory: normal effort GI: Abd soft, non-tender. Pos BS x 4 MS: Extremities nontender, no edema, normal ROM Neurologic: Alert and oriented x 4.  GU: Neg CVAT.  PELVIC EXAM: Cervix pink, visually closed, without lesion, scant white creamy discharge, vaginal walls and external genitalia normal Bimanual exam: Cervix 0/long/high, firm, anterior, neg CMT, uterus nontender, nonenlarged, adnexa without tenderness, enlargement, or mass   LAB RESULTS Results for orders placed or performed during  the hospital encounter of 06/08/16 (from the past 24 hour(s))  Urinalysis, Routine w reflex microscopic     Status: Abnormal   Collection Time: 06/08/16 10:30 AM  Result Value Ref Range   Color, Urine STRAW (A) YELLOW   APPearance CLEAR CLEAR   Specific Gravity, Urine 1.002 (L) 1.005 - 1.030   pH 6.0 5.0 - 8.0   Glucose, UA NEGATIVE NEGATIVE mg/dL   Hgb urine dipstick NEGATIVE NEGATIVE   Bilirubin Urine NEGATIVE NEGATIVE   Ketones, ur NEGATIVE NEGATIVE mg/dL   Protein, ur NEGATIVE NEGATIVE mg/dL   Nitrite NEGATIVE NEGATIVE   Leukocytes, UA NEGATIVE NEGATIVE  Wet prep, genital     Status: Abnormal   Collection Time: 06/08/16 11:40 AM  Result Value Ref Range   Yeast Wet Prep HPF POC NONE SEEN NONE SEEN   Trich, Wet Prep NONE SEEN NONE SEEN   Clue Cells Wet Prep HPF POC NONE SEEN NONE SEEN   WBC, Wet Prep HPF POC MODERATE (A) NONE SEEN   Sperm PRESENT   CBC     Status: None   Collection Time: 06/08/16 11:44 AM  Result Value Ref Range   WBC 9.0 4.0 - 10.5 K/uL   RBC 4.92 3.87 - 5.11 MIL/uL   Hemoglobin 13.5 12.0 - 15.0 g/dL   HCT 40.2 36.0 - 46.0 %   MCV 81.7 78.0 - 100.0 fL   MCH 27.4 26.0 - 34.0 pg   MCHC 33.6 30.0 - 36.0 g/dL   RDW 13.6 11.5 - 15.5 %   Platelets 285 150 - 400 K/uL  hCG, quantitative, pregnancy     Status: Abnormal   Collection Time: 06/08/16 11:44 AM  Result Value Ref Range   hCG, Beta Chain, Quant, S 55,420 (H) <5 mIU/mL  Lipase, blood     Status: None   Collection Time: 06/08/16 11:44 AM  Result Value Ref Range   Lipase 17 11 - 51 U/L       IMAGING US Ob Comp Less 14 Wks  Result Date: 06/08/2016 CLINICAL DATA:  Abdominal cramping for 2 hours. Quantitative beta HCG is pending. By LMP patient is 6 weeks 2 days. EDC by LMP is 01/30/2017. LMP is 04/25/2016. EXAM: OBSTETRIC <14 WK [place] AND TRANSVAGINAL OB US TECHNIQUE: Both transabdominal and transvaginal ultrasound examinations were performed for complete evaluation of the gestation as well as the maternal  uterus, adnexal regions, and pelvic cul-de-sac. Transvaginal technique was performed to assess early pregnancy. COMPARISON:  12/27/2014 FINDINGS: Intrauterine gestational sac: Single Yolk sac:  Present Embryo:  Present Cardiac Activity: Present Heart Rate: 126  bpm CRL:  8.0  mm   6 w   5 d                  [place] EDC: 01/27/2017 Subchorionic hemorrhage:  Small subchorionic hemorrhage. Maternal uterus/adnexae: Normal appearance of the ovaries. IMPRESSION: 1. Single living intrauterine embryo. 2. Size by ultrasound today confirms the clinical EDC  of 01/30/2017. 3. Small subchorionic hemorrhage. Electronically Signed   By: Nolon Nations M.D.   On: 06/08/2016 12:41   US Ob Transvaginal  Result Date: 06/08/2016 CLINICAL DATA:  Abdominal cramping for 2 hours. Quantitative beta HCG is pending. By LMP patient is 6 weeks 2 days. EDC by LMP is 01/30/2017. LMP is 04/25/2016. EXAM: OBSTETRIC <14 WK [place] AND TRANSVAGINAL OB US TECHNIQUE: Both transabdominal and transvaginal ultrasound examinations were performed for complete evaluation of the gestation as well as the maternal uterus, adnexal regions, and pelvic cul-de-sac. Transvaginal technique was performed to assess early pregnancy. COMPARISON:  12/27/2014 FINDINGS: Intrauterine gestational sac: Single Yolk sac:  Present Embryo:  Present Cardiac Activity: Present Heart Rate: 126  bpm CRL:  8.0  mm   6 w   5 d                  [place] EDC: 01/27/2017 Subchorionic hemorrhage:  Small subchorionic hemorrhage. Maternal uterus/adnexae: Normal appearance of the ovaries. IMPRESSION: 1. Single living intrauterine embryo. 2. Size by ultrasound today confirms the clinical EDC of 01/30/2017. 3. Small subchorionic hemorrhage. Electronically Signed   By: Nolon Nations M.D.   On: 06/08/2016 12:41    MAU Management/MDM: Ordered labs and [place] and reviewed results.  IUP confirmed by today's [place].  Pain of upper abdomen/mid abdomen c/w indigestion/acid reflux. Rx for Zantac and Diclegis.   Message sent to [facility] to do prior authorization for Diclegis.  F/U with [facility] as scheduled.   Pt stable at time of discharge.  ASSESSMENT 1. Mild acid reflux   2. Abdominal pain during pregnancy in first trimester   3. Abdominal pain during intrauterine pregnancy     PLAN Discharge home  Allergies as of 06/08/2016      Reactions   Flagyl [metronidazole] Rash   Apple Swelling   Cherry Itching   Ibuprofen Other (See Comments)   Blood thinner medications   Norethindrone Other (See Comments)   headaches   Other Itching   Tree nuts      Medication List    STOP taking these medications   ferrous sulfate 325 (65 FE) MG tablet Commonly known as:  FERROUSUL   methylergonovine 0.2 MG tablet Commonly known as:  METHERGINE   valACYclovir 500 MG tablet Commonly known as:  VALTREX     TAKE these medications   acetaminophen 500 MG tablet Commonly known as:  TYLENOL Take 500 mg by mouth every 6 (six) hours as needed.   diphenhydrAMINE 25 MG tablet Commonly known as:  BENADRYL Take 25 mg by mouth every 6 (six) hours as needed.   Doxylamine-Pyridoxine 10-10 MG Tbec Commonly known as:  DICLEGIS Take 2 tabs at bedtime. If needed, add another tab in the morning. If needed, add another tab in the afternoon, up to 4 tabs/day.   PREPLUS 27-1 MG Tabs Take 1 tablet by mouth daily.   promethazine 25 MG tablet Commonly known as:  PHENERGAN Take 0.5-1 tablets (12.5-25 mg total) by mouth every 6 (six) hours as needed for nausea.   ranitidine 150 MG tablet Commonly known as:  ZANTAC Take 1 tablet (150 mg total) by mouth 2 (two) times daily.      Follow-up Information    With prenatal provider of your choice Follow up.           Fatima Blank Certified Nurse-Midwife 06/08/2016  7:18 PM

## 2016-06-09 LAB — GC/CHLAMYDIA PROBE AMP (~~LOC~~) NOT AT ARMC
CHLAMYDIA, DNA PROBE: NEGATIVE
NEISSERIA GONORRHEA: NEGATIVE

## 2016-06-09 LAB — HIV ANTIBODY (ROUTINE TESTING W REFLEX): HIV SCREEN 4TH GENERATION: NONREACTIVE

## 2016-06-15 ENCOUNTER — Encounter (HOSPITAL_COMMUNITY): Payer: Self-pay | Admitting: *Deleted

## 2016-06-15 ENCOUNTER — Telehealth: Payer: Self-pay | Admitting: *Deleted

## 2016-06-15 ENCOUNTER — Emergency Department (HOSPITAL_COMMUNITY)
Admission: EM | Admit: 2016-06-15 | Discharge: 2016-06-15 | Disposition: A | Discharge status: Home / Self Care | Payer: Medicaid Other | Source: Home / Self Care

## 2016-06-15 ENCOUNTER — Encounter (HOSPITAL_COMMUNITY): Payer: Self-pay | Admitting: Emergency Medicine

## 2016-06-15 ENCOUNTER — Inpatient Hospital Stay (HOSPITAL_COMMUNITY)
Admission: AD | Admit: 2016-06-15 | Discharge: 2016-06-15 | Disposition: A | Discharge status: Home / Self Care | Payer: Medicaid Other | Source: Ambulatory Visit | Attending: Obstetrics & Gynecology | Admitting: Obstetrics & Gynecology

## 2016-06-15 DIAGNOSIS — Z825 Family history of asthma and other chronic lower respiratory diseases: Secondary | ICD-10-CM | POA: Diagnosis not present

## 2016-06-15 DIAGNOSIS — Z5321 Procedure and treatment not carried out due to patient leaving prior to being seen by health care provider: Secondary | ICD-10-CM | POA: Insufficient documentation

## 2016-06-15 DIAGNOSIS — R339 Retention of urine, unspecified: Secondary | ICD-10-CM | POA: Insufficient documentation

## 2016-06-15 DIAGNOSIS — Z87891 Personal history of nicotine dependence: Secondary | ICD-10-CM | POA: Insufficient documentation

## 2016-06-15 DIAGNOSIS — Z91018 Allergy to other foods: Secondary | ICD-10-CM | POA: Diagnosis not present

## 2016-06-15 DIAGNOSIS — R319 Hematuria, unspecified: Secondary | ICD-10-CM | POA: Diagnosis present

## 2016-06-15 DIAGNOSIS — O26892 Other specified pregnancy related conditions, second trimester: Secondary | ICD-10-CM | POA: Insufficient documentation

## 2016-06-15 DIAGNOSIS — Z881 Allergy status to other antibiotic agents status: Secondary | ICD-10-CM | POA: Insufficient documentation

## 2016-06-15 DIAGNOSIS — O2341 Unspecified infection of urinary tract in pregnancy, first trimester: Secondary | ICD-10-CM | POA: Insufficient documentation

## 2016-06-15 DIAGNOSIS — Z86718 Personal history of other venous thrombosis and embolism: Secondary | ICD-10-CM | POA: Diagnosis not present

## 2016-06-15 DIAGNOSIS — Z3A01 Less than 8 weeks gestation of pregnancy: Secondary | ICD-10-CM | POA: Diagnosis not present

## 2016-06-15 DIAGNOSIS — Z8619 Personal history of other infectious and parasitic diseases: Secondary | ICD-10-CM | POA: Insufficient documentation

## 2016-06-15 DIAGNOSIS — Z886 Allergy status to analgesic agent status: Secondary | ICD-10-CM | POA: Diagnosis not present

## 2016-06-15 DIAGNOSIS — Z888 Allergy status to other drugs, medicaments and biological substances status: Secondary | ICD-10-CM | POA: Diagnosis not present

## 2016-06-15 DIAGNOSIS — Z9889 Other specified postprocedural states: Secondary | ICD-10-CM | POA: Diagnosis not present

## 2016-06-15 DIAGNOSIS — Z3A22 22 weeks gestation of pregnancy: Secondary | ICD-10-CM | POA: Insufficient documentation

## 2016-06-15 LAB — URINALYSIS, ROUTINE W REFLEX MICROSCOPIC
BACTERIA UA: NONE SEEN
BILIRUBIN URINE: NEGATIVE
GLUCOSE, UA: NEGATIVE mg/dL
Ketones, ur: NEGATIVE mg/dL
NITRITE: POSITIVE — AB
PH: 5 (ref 5.0–8.0)
Protein, ur: 100 mg/dL — AB
SPECIFIC GRAVITY, URINE: 1.016 (ref 1.005–1.030)

## 2016-06-15 MED ORDER — ENOXAPARIN SODIUM 40 MG/0.4ML ~~LOC~~ SOLN: 40.0000 mg | SUBCUTANEOUS | 6 refills | Status: DC

## 2016-06-15 MED ORDER — NITROFURANTOIN MONOHYD MACRO 100 MG PO CAPS
100.0000 mg | ORAL_CAPSULE | Freq: Two times a day (BID) | ORAL | 0 refills | Status: DC
Start: 2016-06-15 — End: 2017-03-13

## 2016-06-15 MED ORDER — PHENAZOPYRIDINE HCL 200 MG PO TABS: 200.0000 mg | ORAL_TABLET | Freq: Three times a day (TID) | ORAL | 0 refills | Status: DC | PRN

## 2016-06-15 NOTE — Discharge Instructions (Signed)
Pregnancy and Urinary Tract Infection What is a urinary tract infection? A urinary tract infection (UTI) is an infection of any part of the urinary tract. This includes the kidneys, the tubes that connect your kidneys to your bladder (ureters), the bladder, and the tube that carries urine out of your body (urethra). These organs make, store, and get rid of urine in the body. A UTI can be a bladder infection (cystitis) or a kidney infection (pyelonephritis). This infection may be caused by fungi, viruses, and bacteria. Bacteria are the most common cause of UTIs. You are more likely to develop a UTI during pregnancy because:  The physical and hormonal changes your body goes through can make it easier for bacteria to get into your urinary tract.  Your growing baby puts pressure on your uterus and can affect urine flow. Does a UTI place my baby at risk? An untreated UTI during pregnancy could lead to a kidney infection, which can cause health problems that could affect your baby. Possible complications of an untreated UTI include:  Having your baby before 37 weeks of pregnancy (premature).  Having a baby with a low birth weight.  Developing high blood pressure during pregnancy (preeclampsia). What are the symptoms of a UTI? Symptoms of a UTI include:  Fever.  Frequent urination or passing small amounts of urine frequently.  Needing to urinate urgently.  Pain or a burning sensation with urination.  Urine that smells bad or unusual.  Cloudy urine.  Pain in the lower abdomen or back.  Trouble urinating.  Blood in the urine.  Vomiting or being less hungry than normal.  Diarrhea or abdominal pain.  Vaginal discharge. What are the treatment options for a UTI during pregnancy? Treatment for this condition may include:  Antibiotic medicines that are safe to take during pregnancy.  Other medicines to treat less common causes of UTI. How can I prevent a UTI?   To prevent a  UTI:  Go to the bathroom as soon as you feel the need.  Always wipe from front to back.  Wash your genital area with soap and warm water daily.  Empty your bladder before and after sex.  Wear cotton underwear.  Limit your intake of high sugar foods or drinks, such as regular soda, juice, and sweets.  Drink 6-8 glasses of water daily.  Do not wear tight-fitting pants.  Do not douche or use deodorant sprays.  Do not drink alcohol, caffeine, or carbonated drinks. These can irritate the bladder. Contact a health care provider if:  Your symptoms do not improve or get worse.  You have a fever after two days of treatment.  You have a rash.  You have abnormal vaginal discharge.  You have back or side pain.  You have chills.  You have nausea and vomiting. Get help right away if: Seek immediate medical care if you are pregnant and:  You feel contractions in your uterus.  You have lower belly pain.  You have a gush of fluid from your vagina.  You have blood in your urine.  You are vomiting and cannot keep down any medicines or water. This information is not intended to replace advice given to you by your health care provider. Make sure you discuss any questions you have with your health care provider. Document Released: 05/20/2010 Document Revised: 01/07/2016 Document Reviewed: 12/14/2014 Elsevier Interactive Patient Education  2017 [place].   How and Where to Give Subcutaneous Enoxaparin Injections Enoxaparin is an injectable medicine. It is  used to help prevent blood clots from developing in your veins. Health care providers often use anticoagulants like enoxaparin to prevent clots following surgery. Enoxaparin is also used in combination with other medicines to treat blood clots and heart attacks. If blood clots are left untreated, they can be life threatening. Enoxaparin comes in single-use syringes. You inject enoxaparin through a syringe into your belly  (abdomen). You should change the injection site each time you give yourself a shot. Continue the enoxaparin injections as directed by your health care provider. Your health care provider will use blood clotting test results to decide when you can safely stop using enoxaparin injections. If your health care provider prescribes any additional medicines, use the medicines exactly as directed. How do I inject enoxaparin? 1. Wash your hands with soap and water. 2. Clean the selected injection site as directed by your health care provider. 3. Remove the needle cap by pulling it straight off the syringe. 4. When using a prefilled syringe, do not push the air bubble out of the syringe before the injection. The air bubble will help you get all of the medicine out of the syringe. 5. Hold the syringe like a pencil using your writing hand. 6. Use your other hand to pinch and hold an inch of the cleansed skin. 7. Insert the entire needle straight down into the fold of skin. 8. Push the plunger with your thumb until the syringe is empty. 9. Pull the needle straight out of your skin. 10. Enoxaparin injection prefilled syringes and graduated prefilled syringes are available with a system that shields the needle after injection. After you have completed your injection and removed the needle from your skin, firmly push down on the plunger. The protective sleeve will automatically cover the needle and you will hear a click. The click means the needle is safely covered. 11. Place the syringe in the nearest needle box, also called a sharps container. If you do not have a sharps container, you can use a hard-sided plastic container with a secure lid, such as an empty laundry detergent bottle. What else do I need to know?  Do not use enoxaparin if:  You have allergies to heparin or pork products.  You have been diagnosed with a condition called thrombocytopenia.  Do not use the syringe or needle more than one  time.  Use medicines only as directed by your health care provider.  Changes in medicines, supplements, diet, and illness can affect your anticoagulation therapy. Be sure to inform your health care provider of any of these changes.  It is important that you tell all of your health care providers and your dentist that you are taking an anticoagulant, especially if you are injured or plan to have any type of procedure.  While on anticoagulants, you will need to have blood tests done routinely as directed by your health care provider.  While using this medicine, avoid physical activities or sports that could result in a fall or cause injury.  Follow up with your laboratory test and health care provider appointments as directed. It is very important to keep your appointments. Not keeping appointments could result in a chronic or permanent injury, pain, or disability.  Before giving your medicine, you should make sure the injection is a clear and colorless or pale yellow solution. If your medicine becomes discolored or if there are particles in the syringe, do not use it and notify your health care provider.  Keep your medicine safely stored at  room temperature. Contact a health care provider if:  You develop any rashes on your skin.  You have large areas of bruising on your skin.  You have any worsening of the condition for which you take Enoxaparin.  You develop a fever. Get help right away if:  You develop bleeding problems such as:  Bleeding from the gums or nose that does not stop quickly.  Vomiting blood or coughing up blood.  Blood in your urine.  Blood in your stool, or stool that has a dark, tarry, or coffee grounds appearance.  A cut that does not stop bleeding within 10 minutes. These symptoms may represent a serious problem that is an emergency. Do not wait to see if the symptoms will go away. Get medical help right away. Call your local emergency services (911 in the  [place] Do not drive yourself to the hospital.  This information is not intended to replace advice given to you by your health care provider. Make sure you discuss any questions you have with your health care provider. Document Released: 11/25/2003 Document Revised: 09/30/2015 Document Reviewed: 07/10/2013 Elsevier Interactive Patient Education  2017 [employer].

## 2016-06-15 NOTE — Telephone Encounter (Signed)
Pt called to office stating she currently doesn't have insurance that will cover her recent Rx's that were sent from [facility].  Pt currently has MCD-FP and states it should be changed soon. Pt advised will discuss with provider here in office and determine best course since pt is needing to start on Lovenox injections.  Discussed pt with Dr Elly Modena.  Pt should be seen at [facility] early tomorrow and make them aware of her situation and also be seen by Care Management in order to help with Rx's. Pt should be given Lovenox in MAU if possible, pt may also need treatment for UTI if symptoms severe and need treatment.  Placed call to pt to make her aware of recommendations. Pt advised to be seen in MAU early in the day in hopes to be able to talk with Care Management.  Pt states understanding.

## 2016-06-15 NOTE — ED Triage Notes (Signed)
Per EMS pt has Rt and Lt lower flank pain about one hour ago. Pt is [redacted] weeks pregnant. Has not been able to urinate for a little over an hour.

## 2016-06-15 NOTE — MAU Note (Signed)
Pt. Here due to inability to void for two hours, complaints of blood in urine, feeling vaginal pressure.  Gestational age is 7 weeks 2 days.

## 2016-06-15 NOTE — ED Notes (Signed)
Pt left AMA °

## 2016-06-15 NOTE — MAU Provider Note (Signed)
Chief Complaint: Hematuria (Pt. unable to void, blood in urine, unable to void for 2 hours)   First Provider Initiated Contact with Patient 06/15/16 0733     SUBJECTIVE HPI: Amy Dougherty is a 21 y.o. G3P2002 at [redacted]w[redacted]d who presents to [facility] reporting felling like she can't urinate and has had blood in her urine x 2 hours.  Associated signs and symptoms: Pos for pelvic pressure, neg for VB, fever, chills, flank pain.   Hx Ovarian vein thrombosis Postpartum w/ G1. Was on Lovenox w/ G2.   IUP verified on previous US.   Past Medical History:  Diagnosis Date  . Herpes genitalis in women    currently taking medications  . History of blood clots    Blood clot in  Right overy after delivery  . Wisdom teeth extracted    OB History  Gravida Para Term Preterm AB Living  3 2 2  0 0 2  SAB TAB Ectopic Multiple Live Births  0 0 0 0 2    # Outcome Date GA Lbr Len/2nd Weight Sex Delivery Anes PTL Lv  3 Current           2 Term 10/14/12 [redacted]w[redacted]d 04:47 / 00:03 6 lb 3.7 oz (2.825 kg) M Vag-Spont None  LIV  1 Term 02/18/10    M Vag-Spont EPI  LIV     Past Surgical History:  Procedure Laterality Date  . WISDOM TOOTH EXTRACTION     Social History   Social History  . Marital status: Single    Spouse name: N/A  . Number of children: N/A  . Years of education: N/A   Occupational History  . Not on file.   Social History Main Topics  . Smoking status: Former Research scientist (life sciences)  . Smokeless tobacco: Never Used  . Alcohol use 0.0 oz/week  . Drug use: No  . Sexual activity: Yes    Partners: Male    Birth control/ protection: Condom   Other Topics Concern  . Not on file   Social History Narrative  . No narrative on file   Family History  Problem Relation Age of Onset  . Asthma Brother    No current facility-administered medications on file prior to encounter.    Current Outpatient Prescriptions on File Prior to Encounter  Medication Sig Dispense Refill  . acetaminophen (TYLENOL)  500 MG tablet Take 500 mg by mouth every 6 (six) hours as needed.    . diphenhydrAMINE (BENADRYL) 25 MG tablet Take 25 mg by mouth every 6 (six) hours as needed.    . Doxylamine-Pyridoxine (DICLEGIS) 10-10 MG TBEC Take 2 tabs at bedtime. If needed, add another tab in the morning. If needed, add another tab in the afternoon, up to 4 tabs/day. 100 tablet 5  . Prenatal Vit-Fe Fumarate-FA (PREPLUS) 27-1 MG TABS Take 1 tablet by mouth daily. (Patient not taking: Reported on 06/08/2016) 30 tablet 13  . promethazine (PHENERGAN) 25 MG tablet Take 0.5-1 tablets (12.5-25 mg total) by mouth every 6 (six) hours as needed for nausea. 30 tablet 2  . ranitidine (ZANTAC) 150 MG tablet Take 1 tablet (150 mg total) by mouth 2 (two) times daily. 60 tablet 2  . [DISCONTINUED] cetirizine (ZYRTEC) 10 MG tablet Take 10 mg by mouth daily as needed. For cold symptoms     Allergies  Allergen Reactions  . Flagyl [Metronidazole] Rash  . Apple Swelling  . Cherry Itching  . Ibuprofen Other (See Comments)    Blood thinner medications  .  Norethindrone Other (See Comments)    headaches  . Other Itching    Tree nuts    I have reviewed patient's Past Medical Hx, Surgical Hx, Family Hx, Social Hx, medications and allergies.   Review of Systems  Constitutional: Negative for chills and fever.  Gastrointestinal: Negative for abdominal pain.  Genitourinary: Positive for dysuria, frequency, hematuria and urgency. Negative for vaginal bleeding.  Musculoskeletal: Negative for back pain.    OBJECTIVE Patient Vitals for the past 24 hrs:  BP Temp Temp src Pulse Resp Height Weight  06/15/16 0615 123/65 - - 79 18 - -  06/15/16 0545 (!) 143/85 98 F (36.7 C) Oral 90 18 5\' 7"  (1.702 m) 189 lb (85.7 kg)   Constitutional: Well-developed, well-nourished female in no acute distress.  Cardiovascular: normal rate Respiratory: normal rate and effort.  GI: Abd soft, non-tender. Neurologic: Alert and oriented x 4.  GU: Neg  CVAT.   LAB RESULTS Results for orders placed or performed during the hospital encounter of 06/15/16 (from the past 24 hour(s))  Urinalysis, Routine w reflex microscopic- may I&O cath if menses     Status: Abnormal   Collection Time: 06/15/16  2:54 AM  Result Value Ref Range   Color, Urine YELLOW YELLOW   APPearance CLOUDY (A) CLEAR   Specific Gravity, Urine 1.016 1.005 - 1.030   pH 5.0 5.0 - 8.0   Glucose, UA NEGATIVE NEGATIVE mg/dL   Hgb urine dipstick LARGE (A) NEGATIVE   Bilirubin Urine NEGATIVE NEGATIVE   Ketones, ur NEGATIVE NEGATIVE mg/dL   Protein, ur 100 (A) NEGATIVE mg/dL   Nitrite POSITIVE (A) NEGATIVE   Leukocytes, UA LARGE (A) NEGATIVE   RBC / HPF TOO NUMEROUS TO COUNT 0 - 5 RBC/hpf   WBC, UA TOO NUMEROUS TO COUNT 0 - 5 WBC/hpf   Bacteria, UA NONE SEEN NONE SEEN   Squamous Epithelial / LPF 6-30 (A) NONE SEEN   WBC Clumps PRESENT    Mucous PRESENT    Budding Yeast PRESENT     IMAGING NA  MAU COURSE Orders Placed This Encounter  Procedures  . Culture, OB Urine  . Discharge patient   Meds ordered this encounter  Medications  . nitrofurantoin, macrocrystal-monohydrate, (MACROBID) 100 MG capsule    Sig: Take 1 capsule (100 mg total) by mouth 2 (two) times daily.    Dispense:  14 capsule    Refill:  0    Order Specific Question:   Supervising Provider    Answer:   Elonda Husky, LUTHER H [[id number]]  . phenazopyridine (PYRIDIUM) 200 MG tablet    Sig: Take 1 tablet (200 mg total) by mouth 3 (three) times daily as needed for pain.    Dispense:  12 tablet    Refill:  0    Order Specific Question:   Supervising Provider    Answer:   Elonda Husky, LUTHER H [[id number]]  . enoxaparin (LOVENOX) 40 MG/0.4ML injection    Sig: Inject 0.4 mLs (40 mg total) into the skin daily.    Dispense:  30 Syringe    Refill:  6    Order Specific Question:   Supervising Provider    Answer:   Tania Ade H [[id number]]    MDM - UTI w.out evidence of Pyelo. Tx w/ Macrobid and Pyridium.  - Hx VT. Start Loven  per consult w/ Dr. Elonda Husky. Will try to move NOB visit sooner.    ASSESSMENT 1. Urinary tract infection in mother during first trimester of pregnancy   2.  History of thromboembolism     PLAN Discharge home in stable condition. Pyelo precautions Follow-up Information    [place] Follow up.   Why:  will call you to schedule earlier New OB appointment  Contact information: [place] [id number] [place] [facility] up.   Why:  in pregnancy emegencies Contact information: [address] [id number] [place] [address] [phone]         Allergies as of 06/15/2016      Reactions   Flagyl [metronidazole] Rash   Apple Swelling   Cherry Itching   Ibuprofen Other (See Comments)   Blood thinner medications   Norethindrone Other (See Comments)   headaches   Other Itching   Tree nuts      Medication List    TAKE these medications   acetaminophen 500 MG tablet Commonly known as:  TYLENOL Take 500 mg by mouth every 6 (six) hours as needed.   diphenhydrAMINE 25 MG tablet Commonly known as:  BENADRYL Take 25 mg by mouth every 6 (six) hours as needed.   Doxylamine-Pyridoxine 10-10 MG Tbec Commonly known as:  DICLEGIS Take 2 tabs at bedtime. If needed, add another tab in the morning. If needed, add another tab in the afternoon, up to 4 tabs/day.   enoxaparin 40 MG/0.4ML injection Commonly known as:  LOVENOX Inject 0.4 mLs (40 mg total) into the skin daily.   nitrofurantoin (macrocrystal-monohydrate) 100 MG capsule Commonly known as:  MACROBID Take 1 capsule (100 mg total) by mouth 2 (two) times daily.   phenazopyridine 200 MG tablet Commonly known as:  PYRIDIUM Take 1 tablet (200 mg total) by mouth 3 (three) times daily as needed for pain.   PREPLUS 27-1 MG Tabs Take 1 tablet by mouth daily.   promethazine 25 MG  tablet Commonly known as:  PHENERGAN Take 0.5-1 tablets (12.5-25 mg total) by mouth every 6 (six) hours as needed for nausea.   ranitidine 150 MG tablet Commonly known as:  ZANTAC Take 1 tablet (150 mg total) by mouth 2 (two) times daily.        Tamala Julian, [place], [place] 06/15/2016  6:16 AM

## 2016-06-15 NOTE — Telephone Encounter (Signed)
Pt called to office asking for samples of medication she received at [facility].  Pt states her insurance in not current.  Attempt to contact pt. LM on VM to call back.

## 2016-06-16 LAB — URINE CULTURE

## 2016-06-16 LAB — CULTURE, OB URINE

## 2016-06-29 ENCOUNTER — Encounter: Payer: Self-pay | Admitting: Obstetrics & Gynecology

## 2016-07-10 ENCOUNTER — Encounter: Payer: Self-pay | Admitting: Certified Nurse Midwife

## 2016-11-12 ENCOUNTER — Emergency Department (HOSPITAL_COMMUNITY): Payer: Medicaid Other

## 2016-11-12 ENCOUNTER — Encounter (HOSPITAL_COMMUNITY): Payer: Self-pay | Admitting: Emergency Medicine

## 2016-11-12 ENCOUNTER — Emergency Department (HOSPITAL_COMMUNITY)
Admission: EM | Admit: 2016-11-12 | Discharge: 2016-11-12 | Disposition: A | Discharge status: Home / Self Care | Payer: Medicaid Other | Attending: Emergency Medicine | Admitting: Emergency Medicine

## 2016-11-12 DIAGNOSIS — M26629 Arthralgia of temporomandibular joint, unspecified side: Secondary | ICD-10-CM | POA: Insufficient documentation

## 2016-11-12 DIAGNOSIS — R6884 Jaw pain: Secondary | ICD-10-CM

## 2016-11-12 DIAGNOSIS — Z7901 Long term (current) use of anticoagulants: Secondary | ICD-10-CM | POA: Insufficient documentation

## 2016-11-12 DIAGNOSIS — Z87891 Personal history of nicotine dependence: Secondary | ICD-10-CM | POA: Insufficient documentation

## 2016-11-12 DIAGNOSIS — Z79899 Other long term (current) drug therapy: Secondary | ICD-10-CM | POA: Insufficient documentation

## 2016-11-12 NOTE — ED Notes (Addendum)
Patient ambulated to X-ray 

## 2016-11-12 NOTE — Discharge Instructions (Signed)
Take Ibuprofen or Tylenol for pain Apply cool or warm compress to area

## 2016-11-12 NOTE — ED Triage Notes (Signed)
Pt reports that for the last several months her right jaw has been popping and around at 0300 it felt as though her jaw has popped out of place. No obvious deformity noted. Pt able to speak and was able to drink water at 0400.

## 2016-11-12 NOTE — ED Provider Notes (Signed)
[facility] DEPT Provider Note   CSN: [id number] Arrival date & time: 11/12/16  0506     History   Chief Complaint Chief Complaint  Patient presents with  . Jaw Pain    HPI Amy Dougherty is a 21 y.o. female who presents with right sided jaw pain. She states that she's had this issue for several months on and off. Last night her jaw popped and she had moderate-severe jaw pain and thought it might be dislocated. Symptoms are worse with opening her jaw. She has been able to talk and drink. She has never had this evaluated before. She denies teeth grinding. She reports she has had an injury to this jaw before and attributed the pain to that.   HPI  Past Medical History:  Diagnosis Date  . Herpes genitalis in women    currently taking medications  . History of blood clots    Blood clot in  Right overy after delivery  . Wisdom teeth extracted     Patient Active Problem List   Diagnosis Date Noted  . Bipolar 1 disorder, depressed, severe ([place]) 11/05/2013  . Oral herpes simplex infection 05/22/2013  . Gonorrhea 05/22/2013  . Candidiasis of perineum 05/22/2013  . Dermatofibroma of neck 05/22/2013  . History of eczema 05/22/2013  . Genital herpes 05/16/2013  . Chlamydia vaginitis/cervicitis 12/14/2012  . Molluscum contagiosum infection 11/29/2012  . GERD without esophagitis 10/10/2012  . Contraceptive management 06/25/2012  . Dysfunctional uterine bleeding 08/26/2011  . Normocytic anemia 08/26/2011  . History of venous thrombosis 03/09/2010    Past Surgical History:  Procedure Laterality Date  . WISDOM TOOTH EXTRACTION      OB History    Gravida Para Term Preterm AB Living   3 2 2  0 0 2   SAB TAB Ectopic Multiple Live Births   0 0 0 0 2       Home Medications    Prior to Admission medications   Medication Sig Start Date End Date Taking? Authorizing Provider  acetaminophen (TYLENOL) 500 MG tablet Take 500 mg by mouth every 6 (six) hours as needed.     [provider]  diphenhydrAMINE (BENADRYL) 25 MG tablet Take 25 mg by mouth every 6 (six) hours as needed.    [provider]  Doxylamine-Pyridoxine (DICLEGIS) 10-10 MG TBEC Take 2 tabs at bedtime. If needed, add another tab in the morning. If needed, add another tab in the afternoon, up to 4 tabs/day. 06/08/16   Leftwich-Kirby, Kathie Dike, CNM  enoxaparin (LOVENOX) 40 MG/0.4ML injection Inject 0.4 mLs (40 mg total) into the skin daily. 06/15/16   Tamala Julian, [place], CNM  nitrofurantoin, macrocrystal-monohydrate, (MACROBID) 100 MG capsule Take 1 capsule (100 mg total) by mouth 2 (two) times daily. 06/15/16   Tamala Julian, [place], CNM  phenazopyridine (PYRIDIUM) 200 MG tablet Take 1 tablet (200 mg total) by mouth 3 (three) times daily as needed for pain. 06/15/16   Tamala Julian, [place], [place]  Prenatal Vit-Fe Fumarate-FA (PREPLUS) 27-1 MG TABS Take 1 tablet by mouth daily. Patient not taking: Reported on 06/08/2016 06/08/16   Constant, Peggy, MD  promethazine (PHENERGAN) 25 MG tablet Take 0.5-1 tablets (12.5-25 mg total) by mouth every 6 (six) hours as needed for nausea. 06/08/16   Leftwich-Kirby, Kathie Dike, CNM  ranitidine (ZANTAC) 150 MG tablet Take 1 tablet (150 mg total) by mouth 2 (two) times daily. 06/08/16   Leftwich-Kirby, Kathie Dike, CNM    Family History Family History  Problem Relation Age of Onset  .  Asthma Brother     Social History Social History  Substance Use Topics  . Smoking status: Former Research scientist (life sciences)  . Smokeless tobacco: Never Used  . Alcohol use 0.0 oz/week     Comment: Pt reports drinking wine and beer at least 3-4 days per week. Pt reports increased used over the past couple of months.     Allergies   Flagyl [metronidazole]; Apple; Cherry; Ibuprofen; Norethindrone; and Other   Review of Systems Review of Systems  HENT: Negative for dental problem, ear pain and sinus pain.   Musculoskeletal: Positive for arthralgias.  Neurological: Negative for headaches.     Physical  Exam Updated Vital Signs BP (!) 139/94 (BP Location: Left Arm)   Pulse 76   Temp 98 F (36.7 C) (Oral)   Resp 15   Ht 5\' 7"  (1.702 m)   Wt 91.6 kg (202 lb)   LMP 10/23/2016   SpO2 99%   Breastfeeding? Unknown   BMI 31.64 kg/m   Physical Exam  Constitutional: She is oriented to person, place, and time. She appears well-developed and well-nourished. No distress.  HENT:  Head: Normocephalic and atraumatic.    Right Ear: Hearing, tympanic membrane, external ear and ear canal normal.  Left Ear: Hearing, tympanic membrane, external ear and ear canal normal.  Mouth/Throat: No trismus in the jaw.  Limited ROM of jaw. Tenderness over right TMJ. No tenderness of mandible  Eyes: Pupils are equal, round, and reactive to light. Conjunctivae are normal. Right eye exhibits no discharge. Left eye exhibits no discharge. No scleral icterus.  Neck: Normal range of motion.  Cardiovascular: Normal rate.   Pulmonary/Chest: Effort normal. No respiratory distress.  Abdominal: She exhibits no distension.  Neurological: She is alert and oriented to person, place, and time.  Skin: Skin is warm and dry.  Psychiatric: She has a normal mood and affect. Her behavior is normal.  Nursing note and vitals reviewed.    ED Treatments / Results  Labs (all labs ordered are listed, but only abnormal results are displayed) Labs Reviewed - No data to display  EKG  EKG Interpretation None       Radiology Dg Tmj Open & Close Unilateral Right  Result Date: 11/12/2016 CLINICAL DATA:  [place] like right jaw popped out of place. Previous injury to the right jaw about 2 years ago. EXAM: RIGHT UNILATERAL TEMPOROMANDIBULAR JOINTS COMPARISON:  CT maxillofacial 11/21/2012 FINDINGS: There is no evidence of dislocation of the right mandibular head. Normal motion of the mandible with respect to the temporomandibular joint between open and closed-mouth views. No acute fractures or focal bone lesions are identified.  IMPRESSION: Normal examination. Electronically Signed   By: Lucienne Capers M.D.   On: 11/12/2016 06:55    Procedures Procedures (including critical care time)  Medications Ordered in ED Medications - No data to display   Initial Impression / Assessment and Plan / ED Course  I have reviewed the triage vital signs and the nursing notes.  Pertinent labs & imaging results that were available during my care of the patient were reviewed by me and considered in my medical decision making (see chart for details).  21 year old female with right TMJ pain. Acute on chronic. She is able to talk and tolerate PO. Xray is negative. Advised conservative management. F/u with oral surgery.   Final Clinical Impressions(s) / ED Diagnoses   Final diagnoses:  Jaw pain  TMJ syndrome    New Prescriptions New Prescriptions   No medications on  file     Recardo Evangelist, PA-C 11/12/16 9539    Veryl Speak, MD 11/12/16 [phone]

## 2016-11-16 ENCOUNTER — Telehealth: Payer: Self-pay

## 2016-11-16 DIAGNOSIS — B379 Candidiasis, unspecified: Secondary | ICD-10-CM

## 2016-11-16 MED ORDER — FLUCONAZOLE 150 MG PO TABS: 150.0000 mg | ORAL_TABLET | Freq: Once | ORAL | 0 refills | Status: DC

## 2016-11-16 NOTE — Telephone Encounter (Signed)
Pt called to request rx for diflucan. Pt complains of having vaginal itching with discharge. Per protocol rx sent to pharmacy

## 2017-01-03 ENCOUNTER — Ambulatory Visit: Payer: Self-pay | Admitting: *Deleted

## 2017-01-03 NOTE — Telephone Encounter (Signed)
Patient phoned in for appointment for heartburn that occurs intermittently. She stated she uses vinegar and tums, recently for relief.  She also stated she is dealing with obesity and high blood pressure and feels she needs guidance with these concerns as well.  Advised her with possible relief efforts for her heartburn. She has an appointment tomorrow.

## 2017-01-04 ENCOUNTER — Other Ambulatory Visit: Payer: Self-pay

## 2017-01-04 ENCOUNTER — Ambulatory Visit (INDEPENDENT_AMBULATORY_CARE_PROVIDER_SITE_OTHER): Payer: Medicaid Other | Admitting: Family Medicine

## 2017-01-04 ENCOUNTER — Encounter: Payer: Self-pay | Admitting: Family Medicine

## 2017-01-04 VITALS — BP 137/83 | HR 103 | Temp 99.0°F | Resp 16 | Ht 67.0 in | Wt 204.4 lb

## 2017-01-04 DIAGNOSIS — B379 Candidiasis, unspecified: Secondary | ICD-10-CM

## 2017-01-04 DIAGNOSIS — K219 Gastro-esophageal reflux disease without esophagitis: Secondary | ICD-10-CM | POA: Diagnosis not present

## 2017-01-04 MED ORDER — FLUCONAZOLE 150 MG PO TABS: 150.0000 mg | ORAL_TABLET | Freq: Once | ORAL | 0 refills | Status: AC

## 2017-01-04 NOTE — Progress Notes (Signed)
Chief Complaint  Patient presents with  . severe reflux    x 1 week , tried otc tums with and without relief, pt felt she was taking too many .    HPI  Pt reports that she has a history of heart burn that lead to [facility]  She reports that she went to [facility] for evaluation and was told she had a pulled muscle She started taking hair affinity multivitamin a week and a half ago She states that she took hair affinity for 3 days and had heart burn She states that she gets heart bear after eating especially salty and spicy foods Her heart burn feels like sharp pain that is center of the chest and makes her feel like she cannot breath She states that she cannot tolerate salt or seasoning She ate a burger from [employer] and noticed that she had exacerbation of her symptoms She took Tums and she took 2 every 4 hours  She is not having the pain currently She has not eating today She last took TUMS yesterday     Past Medical History:  Diagnosis Date  . Herpes genitalis in women    currently taking medications  . History of blood clots    Blood clot in  Right overy after delivery  . Wisdom teeth extracted     Current Outpatient Medications  Medication Sig Dispense Refill  . ibuprofen (ADVIL,MOTRIN) 200 MG tablet Take 200 mg by mouth every 6 (six) hours as needed for fever or moderate pain.     Marland Kitchen acetaminophen (TYLENOL) 500 MG tablet Take 500 mg by mouth every 6 (six) hours as needed for mild pain or headache.     . clindamycin (CLEOCIN) 300 MG capsule Take 1 capsule (300 mg total) by mouth 3 (three) times daily. 21 capsule 0  . drospirenone-ethinyl estradiol (YAZ) 3-0.02 MG tablet Take 1 tablet by mouth daily. 1 Package 11  . PRESCRIPTION MEDICATION Take 1 tablet by mouth daily. Birth control. Not sure of name     No current facility-administered medications for this visit.     Allergies:  Allergies  Allergen Reactions  . Flagyl [Metronidazole] Rash   . Apple Swelling  . Cherry Itching  . Ibuprofen Other (See Comments)    Blood thinner medications  . Norethindrone Other (See Comments)    headaches  . Other Itching    Tree nuts    Past Surgical History:  Procedure Laterality Date  . WISDOM TOOTH EXTRACTION      Social History   Socioeconomic History  . Marital status: Single    Spouse name: Not on file  . Number of children: Not on file  . Years of education: Not on file  . Highest education level: Not on file  Occupational History  . Not on file  Social Needs  . Financial resource strain: Not on file  . Food insecurity:    Worry: Not on file    Inability: Not on file  . Transportation needs:    Medical: Not on file    Non-medical: Not on file  Tobacco Use  . Smoking status: Never Smoker  . Smokeless tobacco: Current User  Substance and Sexual Activity  . Alcohol use: Yes    Alcohol/week: 0.0 oz    Comment: Pt reports drinking wine and beer at least 3-4 days per week. Pt reports increased used over the past couple of months.  . Drug use: No  .  Sexual activity: Yes    Partners: Male    Birth control/protection: Condom  Lifestyle  . Physical activity:    Days per week: Not on file    Minutes per session: Not on file  . Stress: Not on file  Relationships  . Social connections:    Talks on phone: Not on file    Gets together: Not on file    Attends religious service: Not on file    Active member of club or organization: Not on file    Attends meetings of clubs or organizations: Not on file    Relationship status: Not on file  Other Topics Concern  . Not on file  Social History Narrative  . Not on file    Family History  Problem Relation Age of Onset  . Asthma Brother      ROS Review of Systems See HPI Constitution: No fevers or chills No malaise No diaphoresis Skin: No rash or itching Eyes: no blurry vision, no double vision GU: no dysuria or hematuria Neuro: no dizziness or headaches   all others reviewed and negative   Objective: Vitals:   01/04/17 1627 01/04/17 1633  BP: (!) 142/82 137/83  Pulse: (!) 103   Resp: 16   Temp: 99 F (37.2 C)   TempSrc: Oral   SpO2: 97%   Weight: 204 lb 6.4 oz (92.7 kg)   Height: 5\' 7"  (1.702 m)     Physical Exam  Constitutional: She is oriented to person, place, and time. She appears well-developed and well-nourished.  HENT:  Head: Normocephalic and atraumatic.  Eyes: Conjunctivae and EOM are normal.  Cardiovascular: Normal rate, regular rhythm and normal heart sounds.  No murmur heard. Pulmonary/Chest: Effort normal and breath sounds normal. No stridor. No respiratory distress. She has no wheezes.  Abdominal: Soft. Normal appearance and bowel sounds are normal. There is no hepatosplenomegaly. There is tenderness in the epigastric area. There is no rigidity, no rebound, no guarding, no CVA tenderness, no tenderness at McBurney's point and negative Murphy's sign. No hernia.  Neurological: She is alert and oriented to person, place, and time.    Assessment and Plan Teana was seen today for severe reflux.  Diagnoses and all orders for this visit:  Gastroesophageal reflux disease, esophagitis presence not specified -     Cancel: H. pylori breath test -     H. pylori breath test; Future   Advised pt to take H. Pylori testing BEFORE initiating PPI  Amy Dougherty A Johnnell Liou

## 2017-01-04 NOTE — Patient Instructions (Addendum)
IF you received an x-ray today, you will receive an invoice from [facility]. Please contact [facility] at [phone] with questions or concerns regarding your invoice.   IF you received labwork today, you will receive an invoice from [facility]. Please contact [facility] at [phone] with questions or concerns regarding your invoice.   Our billing staff will not be able to assist you with questions regarding bills from these companies.  You will be contacted with the lab results as soon as they are available. The fastest way to get your results is to activate your My Chart account. Instructions are located on the last page of this paperwork. If you have not heard from [place] regarding the results in 2 weeks, please contact this office.    Helicobacter Pylori Antibodies Test Why am I having this test? This is a blood test that looks for bacteria called Helicobacter pylori (H. pylori). H. pylori is a germ that can be found in the cells that line the stomach. Having high levels of H. pylori in your stomach puts you at risk for stomach ulcers and small bowel ulcers, long-term (chronic) inflammation of the lining of the stomach, or even ulcers that may occur in the canal that runs from the mouth to the stomach (esophagus). Presence of H. pylori can also increase your risk for stomach cancer if it is left untreated. Most people with H. pylori in their stomach bacteria have no symptoms. Your health care provider may ask you to have this test if you have symptoms of a stomach ulcer or small bowel ulcer, such as stomach pain before or after eating, heartburn, or nausea repeatedly after eating. What kind of sample is taken? A blood sample is required for this test. It is usually collected by inserting a needle into a vein or sticking a finger with a small needle. How do I prepare for this test? There is no preparation required for this test. What are the reference ranges? Reference  ranges are considered healthy ranges established after testing a large group of healthy people. Reference ranges may vary among different people, labs, and hospitals. It is your responsibility to obtain your test results. Ask the lab or department performing the test when and how you will get your results. Your test results will be reported as positive, negative, or equivocal. Equivocal means that your results are neither positive nor negative. References ranges for these results are as follows:  Less than or equal to 30 units/mL. This is negative.  Greater than or equal to 40 units/mL. This is positive.  30.01-39.99 units/mL. This is equivocal.  What do the results mean? Test results that are higher than normal may indicate numerous health conditions. These may include:  Short-term or long-term irritation of the stomach lining (gastritis).  Small bowel ulcer.  Stomach ulcer.  Stomach cancer.  Talk with your health care provider to discuss your results, treatment options, and if necessary, the need for more tests. Talk with your health care provider if you have any questions about your results. Talk with your health care provider to discuss your results, treatment options, and if necessary, the need for more tests. Talk with your health care provider if you have any questions about your results. This information is not intended to replace advice given to you by your health care provider. Make sure you discuss any questions you have with your health care provider. Document Released: 02/17/2004 Document Revised: 09/29/2015 Document Reviewed: 06/06/2013 Elsevier Interactive Patient Education  2018 Elsevier Inc.  

## 2017-01-05 ENCOUNTER — Ambulatory Visit: Payer: Self-pay | Admitting: Emergency Medicine

## 2017-01-11 ENCOUNTER — Ambulatory Visit: Payer: Self-pay | Admitting: Family Medicine

## 2017-03-13 ENCOUNTER — Encounter (HOSPITAL_COMMUNITY): Payer: Self-pay | Admitting: *Deleted

## 2017-03-13 ENCOUNTER — Other Ambulatory Visit: Payer: Self-pay

## 2017-03-13 ENCOUNTER — Inpatient Hospital Stay (HOSPITAL_COMMUNITY)
Admission: AD | Admit: 2017-03-13 | Discharge: 2017-03-13 | Disposition: A | Discharge status: Home / Self Care | Payer: Medicaid Other | Source: Ambulatory Visit | Attending: Obstetrics and Gynecology | Admitting: Obstetrics and Gynecology

## 2017-03-13 DIAGNOSIS — N939 Abnormal uterine and vaginal bleeding, unspecified: Secondary | ICD-10-CM | POA: Insufficient documentation

## 2017-03-13 DIAGNOSIS — F17200 Nicotine dependence, unspecified, uncomplicated: Secondary | ICD-10-CM | POA: Insufficient documentation

## 2017-03-13 LAB — CBC
HCT: 34.8 % — ABNORMAL LOW (ref 36.0–46.0)
HEMOGLOBIN: 11.4 g/dL — AB (ref 12.0–15.0)
MCH: 26.5 pg (ref 26.0–34.0)
MCHC: 32.8 g/dL (ref 30.0–36.0)
MCV: 80.7 fL (ref 78.0–100.0)
PLATELETS: 273 10*3/uL (ref 150–400)
RBC: 4.31 MIL/uL (ref 3.87–5.11)
RDW: 14 % (ref 11.5–15.5)
WBC: 8 10*3/uL (ref 4.0–10.5)

## 2017-03-13 LAB — URINALYSIS, ROUTINE W REFLEX MICROSCOPIC
Bilirubin Urine: NEGATIVE
GLUCOSE, UA: NEGATIVE mg/dL
KETONES UR: NEGATIVE mg/dL
LEUKOCYTES UA: NEGATIVE
NITRITE: NEGATIVE
PROTEIN: NEGATIVE mg/dL
Specific Gravity, Urine: 1.002 — ABNORMAL LOW (ref 1.005–1.030)
pH: 6 (ref 5.0–8.0)

## 2017-03-13 LAB — POCT PREGNANCY, URINE: Preg Test, Ur: NEGATIVE

## 2017-03-13 NOTE — MAU Note (Addendum)
Pt. Started her period started on January 15th; pt. Has been bleeding since then with large clots.

## 2017-03-13 NOTE — MAU Provider Note (Signed)
History     CSN: [id number]  Arrival date and time: 03/13/17 8242   First Provider Initiated Contact with Patient 03/13/17 1016      Chief Complaint  Patient presents with  . Vaginal Bleeding   22 y.o. non-pregnant female here with VB and passing clots. Bleeding started on Jan 15th and she assumed that was her period but bleeding hasn't stopped. She reports saturating 2-3 pads q 1-1.5 hrs. She was evaluated at the [facility] last week but only had STD screen which was negative and was told +BV. She was also evaluated at an ED in [place] late last night and they gave her OCPs to control the bleeding. She reports taking one dose and the bleeding hasn't improved. Prior to this bleeding episode she was not using any contraception. No past hx of bleeding disorders.    Past Medical History:  Diagnosis Date  . Herpes genitalis in women    currently taking medications  . History of blood clots    Blood clot in  Right overy after delivery  . Wisdom teeth extracted     Past Surgical History:  Procedure Laterality Date  . WISDOM TOOTH EXTRACTION      Family History  Problem Relation Age of Onset  . Asthma Brother     Social History   Tobacco Use  . Smoking status: Current Some Day Smoker  . Smokeless tobacco: Current User  Substance Use Topics  . Alcohol use: Yes    Alcohol/week: 0.0 oz    Comment: Pt reports drinking wine and beer at least 3-4 days per week. Pt reports increased used over the past couple of months.  . Drug use: No    Allergies:  Allergies  Allergen Reactions  . Flagyl [Metronidazole] Rash  . Apple Swelling  . Cherry Itching  . Ibuprofen Other (See Comments)    Blood thinner medications  . Norethindrone Other (See Comments)    headaches  . Other Itching    Tree nuts    Medications Prior to Admission  Medication Sig Dispense Refill Last Dose  . acetaminophen (TYLENOL) 500 MG tablet Take 500 mg by mouth every 6 (six) hours as needed for mild pain or  headache.    prn  . ibuprofen (ADVIL,MOTRIN) 200 MG tablet Take 200 mg by mouth every 6 (six) hours as needed for fever or moderate pain.    Past Week at Unknown time  . PRESCRIPTION MEDICATION Take 1 tablet by mouth daily. Birth control. Not sure of name   03/13/2017 at Unknown time  . Doxylamine-Pyridoxine (DICLEGIS) 10-10 MG TBEC Take 2 tabs at bedtime. If needed, add another tab in the morning. If needed, add another tab in the afternoon, up to 4 tabs/day. (Patient not taking: Reported on 01/04/2017) 100 tablet 5 Not Taking  . enoxaparin (LOVENOX) 40 MG/0.4ML injection Inject 0.4 mLs (40 mg total) into the skin daily. (Patient not taking: Reported on 01/04/2017) 30 Syringe 6 Not Taking  . nitrofurantoin, macrocrystal-monohydrate, (MACROBID) 100 MG capsule Take 1 capsule (100 mg total) by mouth 2 (two) times daily. (Patient not taking: Reported on 01/04/2017) 14 capsule 0 Not Taking  . Prenatal Vit-Fe Fumarate-FA (PREPLUS) 27-1 MG TABS Take 1 tablet by mouth daily. (Patient not taking: Reported on 06/08/2016) 30 tablet 13 Not Taking  . promethazine (PHENERGAN) 25 MG tablet Take 0.5-1 tablets (12.5-25 mg total) by mouth every 6 (six) hours as needed for nausea. (Patient not taking: Reported on 01/04/2017) 30 tablet 2 Not Taking  .  ranitidine (ZANTAC) 150 MG tablet Take 1 tablet (150 mg total) by mouth 2 (two) times daily. (Patient not taking: Reported on 01/04/2017) 60 tablet 2 Not Taking    Review of Systems  Constitutional: Positive for fatigue.  Gastrointestinal: Negative for abdominal pain.  Genitourinary: Positive for vaginal bleeding.  Neurological: Negative for syncope, light-headedness and headaches.   Physical Exam   Blood pressure 132/77, pulse 94, weight 205 lb 0 oz (93 kg), last menstrual period 04/25/2016, unknown if currently breastfeeding.  Physical Exam  Nursing note and vitals reviewed. Constitutional: She is oriented to person, place, and time. She appears well-developed and  well-nourished. No distress.  HENT:  Head: Normocephalic and atraumatic.  Neck: Normal range of motion.  Respiratory: Effort normal. No respiratory distress.  Genitourinary:  Genitourinary Comments: External: no lesions or erythema Vagina: rugated, pink, moist, small drk bloody discharge, cleared with 2 fox swabs Uterus: non enlarged, anteverted, non tender, no CMT Adnexae: no masses, no tenderness left, no tenderness right   Musculoskeletal: Normal range of motion.  Neurological: She is alert and oriented to person, place, and time.  Skin: Skin is warm and dry.  Psychiatric: She has a normal mood and affect.   Results for orders placed or performed during the hospital encounter of 03/13/17 (from the past 24 hour(s))  Urinalysis, Routine w reflex microscopic     Status: Abnormal   Collection Time: 03/13/17  9:45 AM  Result Value Ref Range   Color, Urine YELLOW YELLOW   APPearance CLEAR CLEAR   Specific Gravity, Urine 1.002 (L) 1.005 - 1.030   pH 6.0 5.0 - 8.0   Glucose, UA NEGATIVE NEGATIVE mg/dL   Hgb urine dipstick LARGE (A) NEGATIVE   Bilirubin Urine NEGATIVE NEGATIVE   Ketones, ur NEGATIVE NEGATIVE mg/dL   Protein, ur NEGATIVE NEGATIVE mg/dL   Nitrite NEGATIVE NEGATIVE   Leukocytes, UA NEGATIVE NEGATIVE   RBC / HPF 6-30 0 - 5 RBC/hpf   WBC, UA 0-5 0 - 5 WBC/hpf   Bacteria, UA RARE (A) NONE SEEN   Squamous Epithelial / LPF 0-5 (A) NONE SEEN  Pregnancy, urine POC     Status: None   Collection Time: 03/13/17 10:09 AM  Result Value Ref Range   Preg Test, Ur NEGATIVE NEGATIVE  CBC     Status: Abnormal   Collection Time: 03/13/17 11:12 AM  Result Value Ref Range   WBC 8.0 4.0 - 10.5 K/uL   RBC 4.31 3.87 - 5.11 MIL/uL   Hemoglobin 11.4 (L) 12.0 - 15.0 g/dL   HCT 34.8 (L) 36.0 - 46.0 %   MCV 80.7 78.0 - 100.0 fL   MCH 26.5 26.0 - 34.0 pg   MCHC 32.8 30.0 - 36.0 g/dL   RDW 14.0 11.5 - 15.5 %   Platelets 273 150 - 400 K/uL   MAU Course  Procedures  MDM Labs ordered  and reviewed. STD screen not repeated. No evidence of pregnancy or ABL anemia. Recommend current plan for OCP taper- 2 tabs daily until bleeding stops. Pt has Rx for OCPs. Will order outpt pelvic US and have her f/u at [facility] (previously seen there) for further evaluation. Stable for discharge home.  Assessment and Plan   1. Abnormal uterine bleeding (AUB)    Discharge home Follow up at [facility] in 2-3 weeks Pelvic US in 1 week- order placed Continue OCPs  Allergies as of 03/13/2017      Reactions   Flagyl [metronidazole] Rash   Apple Swelling  Cherry Itching   Ibuprofen Other (See Comments)   Blood thinner medications   Norethindrone Other (See Comments)   headaches   Other Itching   Tree nuts      Medication List    STOP taking these medications   Doxylamine-Pyridoxine 10-10 MG Tbec Commonly known as:  DICLEGIS   enoxaparin 40 MG/0.4ML injection Commonly known as:  LOVENOX   nitrofurantoin (macrocrystal-monohydrate) 100 MG capsule Commonly known as:  MACROBID   PREPLUS 27-1 MG Tabs   promethazine 25 MG tablet Commonly known as:  PHENERGAN   ranitidine 150 MG tablet Commonly known as:  ZANTAC     TAKE these medications   acetaminophen 500 MG tablet Commonly known as:  TYLENOL Take 500 mg by mouth every 6 (six) hours as needed for mild pain or headache.   ibuprofen 200 MG tablet Commonly known as:  ADVIL,MOTRIN Take 200 mg by mouth every 6 (six) hours as needed for fever or moderate pain.   PRESCRIPTION MEDICATION Take 1 tablet by mouth daily. Birth control. Not sure of name      Julianne Handler, CNM 03/13/2017, 12:06 PM

## 2017-03-13 NOTE — MAU Note (Signed)
History    Chief Complaint  Patient presents with  . Vaginal Bleeding   Vaginal Bleeding      Amy Dougherty is a  22 y.o. [phone] female with a PMHx significant for Bipolar 1 disorder, GERD, and DUB who presents with a CC of "heavy bleeding and clots." She states that she began having vaginal bleeding on January 15th. She states that since this is near the normal time for her menstrual period, she was not concerned, but the bleeding has not stopped. She denies any syncope, but endorses feeling fatigued and "run down." Patient states that she has noticed clots in her bleeding recently. She denies any history of clotting disorders. Patient was evaluated in an ED in [place] last night where they gave her OCPs to help stop the bleeding. She states that it did not help and has only gotten worse. She says that she has been going through 2-3 pads per 90 minutes at home when she is active.   Denies headache, dizziness, syncope, fevers, chills, chest pain, palpitations, vomiting, diarrhea, constipation, urinary symptoms, numbness/weakness/tingling of extremities. Endorses nausea which she associates with her GERD   Pertinent Gynecological History: Bleeding: intermenstrual bleeding Contraception: none DES exposure: unknown Blood transfusions: none Sexually transmitted diseases: past history as described below Previous GYN Procedures: NONE    Past Medical History:  Diagnosis Date  . Herpes genitalis in women    currently taking medications  . History of blood clots    Blood clot in  Right overy after delivery  . Wisdom teeth extracted     Past Surgical History:  Procedure Laterality Date  . WISDOM TOOTH EXTRACTION      Family History  Problem Relation Age of Onset  . Asthma Brother     Social History   Tobacco Use  . Smoking status: Current Some Day Smoker  . Smokeless tobacco: Current User  Substance Use Topics  . Alcohol use: Yes    Alcohol/week: 0.0 oz    Comment: Pt reports  drinking wine and beer at least 3-4 days per week. Pt reports increased used over the past couple of months.  . Drug use: No    Allergies:  Allergies  Allergen Reactions  . Flagyl [Metronidazole] Rash  . Apple Swelling  . Cherry Itching  . Ibuprofen Other (See Comments)    Blood thinner medications  . Norethindrone Other (See Comments)    headaches  . Other Itching    Tree nuts    Medications Prior to Admission  Medication Sig Dispense Refill Last Dose  . acetaminophen (TYLENOL) 500 MG tablet Take 500 mg by mouth every 6 (six) hours as needed.   Not Taking  . diphenhydrAMINE (BENADRYL) 25 MG tablet Take 25 mg by mouth every 6 (six) hours as needed.   Not Taking  . Doxylamine-Pyridoxine (DICLEGIS) 10-10 MG TBEC Take 2 tabs at bedtime. If needed, add another tab in the morning. If needed, add another tab in the afternoon, up to 4 tabs/day. (Patient not taking: Reported on 01/04/2017) 100 tablet 5 Not Taking  . enoxaparin (LOVENOX) 40 MG/0.4ML injection Inject 0.4 mLs (40 mg total) into the skin daily. (Patient not taking: Reported on 01/04/2017) 30 Syringe 6 Not Taking  . ibuprofen (ADVIL,MOTRIN) 200 MG tablet Take 200 mg by mouth every 6 (six) hours as needed.   Taking  . nitrofurantoin, macrocrystal-monohydrate, (MACROBID) 100 MG capsule Take 1 capsule (100 mg total) by mouth 2 (two) times daily. (Patient not taking: Reported on 01/04/2017)  14 capsule 0 Not Taking  . phenazopyridine (PYRIDIUM) 200 MG tablet Take 1 tablet (200 mg total) by mouth 3 (three) times daily as needed for pain. (Patient not taking: Reported on 01/04/2017) 12 tablet 0 Not Taking  . Prenatal Vit-Fe Fumarate-FA (PREPLUS) 27-1 MG TABS Take 1 tablet by mouth daily. (Patient not taking: Reported on 06/08/2016) 30 tablet 13 Not Taking  . promethazine (PHENERGAN) 25 MG tablet Take 0.5-1 tablets (12.5-25 mg total) by mouth every 6 (six) hours as needed for nausea. (Patient not taking: Reported on 01/04/2017) 30 tablet 2 Not  Taking  . ranitidine (ZANTAC) 150 MG tablet Take 1 tablet (150 mg total) by mouth 2 (two) times daily. (Patient not taking: Reported on 01/04/2017) 60 tablet 2 Not Taking    Review of Systems  Genitourinary: Positive for vaginal bleeding.   Physical Exam Weight 93 kg (205 lb 0 oz), last menstrual period 04/25/2016, unknown if currently breastfeeding.      Physical Exam  Constitutional: She appears well-developed and well-nourished. No distress.  Genitourinary: Uterus normal. There is bleeding (moderate amount of blood observed in vault. removed with 2 fox swabs. ) in the vagina. Right adnexum does not display mass and does not display tenderness. Left adnexum does not display mass and does not display tenderness. Cervix does not exhibit motion tenderness or friability.  Eyes: Conjunctivae and EOM are normal. Pupils are equal, round, and reactive to light. Right eye exhibits no discharge. Left eye exhibits no discharge. No scleral icterus.  Cardiovascular: Normal rate, regular rhythm and normal heart sounds. Exam reveals no gallop and no friction rub.  No murmur heard. Pulmonary/Chest: Effort normal.  Abdominal: Soft. Bowel sounds are normal.    MAU Course Procedures  MDM Vaginal Bleeding -take OCPs prescribed by outside ED BID until bleeding stops, then one daily, as directed per ED in [place] -follow up in 1 month at clinic for TVUS  -RTC if symptoms fail to improve, new syncope, shortness of breath, palpitations, chest pain

## 2017-03-13 NOTE — Discharge Instructions (Signed)

## 2017-03-20 ENCOUNTER — Ambulatory Visit (HOSPITAL_COMMUNITY)
Admission: RE | Admit: 2017-03-20 | Discharge: 2017-03-20 | Disposition: A | Discharge status: Home / Self Care | Payer: Medicaid Other | Source: Ambulatory Visit | Attending: Certified Nurse Midwife | Admitting: Certified Nurse Midwife

## 2017-03-20 DIAGNOSIS — N939 Abnormal uterine and vaginal bleeding, unspecified: Secondary | ICD-10-CM | POA: Insufficient documentation

## 2017-06-14 ENCOUNTER — Ambulatory Visit (INDEPENDENT_AMBULATORY_CARE_PROVIDER_SITE_OTHER): Payer: Medicaid Other | Admitting: Obstetrics

## 2017-06-14 ENCOUNTER — Other Ambulatory Visit (HOSPITAL_COMMUNITY)
Admission: RE | Admit: 2017-06-14 | Discharge: 2017-06-14 | Disposition: A | Discharge status: Home / Self Care | Payer: Medicaid Other | Source: Ambulatory Visit | Attending: Obstetrics | Admitting: Obstetrics

## 2017-06-14 ENCOUNTER — Encounter: Payer: Self-pay | Admitting: Obstetrics

## 2017-06-14 VITALS — BP 143/80 | HR 92 | Ht 67.0 in | Wt 199.9 lb

## 2017-06-14 DIAGNOSIS — Z124 Encounter for screening for malignant neoplasm of cervix: Secondary | ICD-10-CM | POA: Insufficient documentation

## 2017-06-14 DIAGNOSIS — Z0000 Encounter for general adult medical examination without abnormal findings: Secondary | ICD-10-CM

## 2017-06-14 DIAGNOSIS — N926 Irregular menstruation, unspecified: Secondary | ICD-10-CM

## 2017-06-14 DIAGNOSIS — F32A Depression, unspecified: Secondary | ICD-10-CM

## 2017-06-14 DIAGNOSIS — Z01419 Encounter for gynecological examination (general) (routine) without abnormal findings: Secondary | ICD-10-CM

## 2017-06-14 DIAGNOSIS — Z30011 Encounter for initial prescription of contraceptive pills: Secondary | ICD-10-CM

## 2017-06-14 DIAGNOSIS — Z3009 Encounter for other general counseling and advice on contraception: Secondary | ICD-10-CM

## 2017-06-14 DIAGNOSIS — Z3202 Encounter for pregnancy test, result negative: Secondary | ICD-10-CM

## 2017-06-14 DIAGNOSIS — Z113 Encounter for screening for infections with a predominantly sexual mode of transmission: Secondary | ICD-10-CM

## 2017-06-14 DIAGNOSIS — F329 Major depressive disorder, single episode, unspecified: Secondary | ICD-10-CM

## 2017-06-14 LAB — POCT URINE PREGNANCY: Preg Test, Ur: NEGATIVE

## 2017-06-14 MED ORDER — DROSPIRENONE-ETHINYL ESTRADIOL 3-0.02 MG PO TABS: 1.0000 | ORAL_TABLET | Freq: Every day | ORAL | 11 refills | Status: DC

## 2017-06-14 NOTE — Progress Notes (Signed)
Patient presents for Annual Exam.   CC: irregular periods. Pt has other issues to discuss w/ provider only.   Last pap:  LMP: unsure Contraception: None  STD screening: Full Panel   Pt states 07/09/2016 she had an abortion in [place] and her periods have not been the same since.

## 2017-06-14 NOTE — Progress Notes (Signed)
Subjective:        Amy Dougherty is a 22 y.o. female here for a routine exam.  Current complaints: Irregular cycles.  Tried OCP's once, and they caused her to feel empty.    Personal health questionnaire:  Is patient Ashkenazi Jewish, have a family history of breast and/or ovarian cancer: no Is there a family history of uterine cancer diagnosed at age < 37, gastrointestinal cancer, urinary tract cancer, family member who is a Field seismologist syndrome-associated carrier: no Is the patient overweight and hypertensive, family history of diabetes, personal history of gestational diabetes, preeclampsia or PCOS: no Is patient over 69, have PCOS,  family history of premature CHD under age 72, diabetes, smoke, have hypertension or peripheral artery disease:  no At any time, has a partner hit, kicked or otherwise hurt or frightened you?: no Over the past 2 weeks, have you felt down, depressed or hopeless?: no Over the past 2 weeks, have you felt little interest or pleasure in doing things?:no   Gynecologic History No LMP recorded. Contraception: condoms Last Pap: 2016. Results were: normal Last mammogram: n/a. Results were: n/a  Obstetric History OB History  Gravida Para Term Preterm AB Living  3 2 2  0 1 2  SAB TAB Ectopic Multiple Live Births  1 0 0 0 2    # Outcome Date GA Lbr Len/2nd Weight Sex Delivery Anes PTL Lv  3 SAB 07/2016          2 Term 10/14/12 [redacted]w[redacted]d 04:47 / 00:03 6 lb 3.7 oz (2.825 kg) M Vag-Spont None  LIV  1 Term 02/18/10    M Vag-Spont EPI  LIV    Past Medical History:  Diagnosis Date  . Herpes genitalis in women    currently taking medications  . History of blood clots    Blood clot in  Right overy after delivery  . Wisdom teeth extracted     Past Surgical History:  Procedure Laterality Date  . WISDOM TOOTH EXTRACTION       Current Outpatient Medications:  .  acetaminophen (TYLENOL) 500 MG tablet, Take 500 mg by mouth every 6 (six) hours as needed for mild pain  or headache. , Disp: , Rfl:  .  drospirenone-ethinyl estradiol (YAZ) 3-0.02 MG tablet, Take 1 tablet by mouth daily., Disp: 1 Package, Rfl: 11 .  ibuprofen (ADVIL,MOTRIN) 200 MG tablet, Take 200 mg by mouth every 6 (six) hours as needed for fever or moderate pain. , Disp: , Rfl:  .  PRESCRIPTION MEDICATION, Take 1 tablet by mouth daily. Birth control. Not sure of name, Disp: , Rfl:  Allergies  Allergen Reactions  . Flagyl [Metronidazole] Rash  . Apple Swelling  . Cherry Itching  . Ibuprofen Other (See Comments)    Blood thinner medications  . Norethindrone Other (See Comments)    headaches  . Other Itching    Tree nuts    Social History   Tobacco Use  . Smoking status: Never Smoker  . Smokeless tobacco: Current User  Substance Use Topics  . Alcohol use: Yes    Alcohol/week: 0.0 oz    Comment: Pt reports drinking wine and beer at least 3-4 days per week. Pt reports increased used over the past couple of months.    Family History  Problem Relation Age of Onset  . Asthma Brother       Review of Systems  Constitutional: negative for fatigue and weight loss Respiratory: negative for cough and wheezing Cardiovascular: negative for  chest pain, fatigue and palpitations Gastrointestinal: negative for abdominal pain and change in bowel habits Musculoskeletal:negative for myalgias Neurological: negative for gait problems and tremors Behavioral/Psych: negative for abusive relationship, depression Endocrine: negative for temperature intolerance    Genitourinary:negative for abnormal menstrual periods, genital lesions, hot flashes, sexual problems and vaginal discharge Integument/breast: negative for breast lump, breast tenderness, nipple discharge and skin lesion(s)    Objective:       BP (!) 143/80   Pulse 92   Ht 5\' 7"  (1.702 m)   Wt 199 lb 14.4 oz (90.7 kg)   BMI 31.31 kg/m  General:   alert  Skin:   no rash or abnormalities  Lungs:   clear to auscultation bilaterally   Heart:   regular rate and rhythm, S1, S2 normal, no murmur, click, rub or gallop  Breasts:   normal without suspicious masses, skin or nipple changes or axillary nodes  Abdomen:  normal findings: no organomegaly, soft, non-tender and no hernia  Pelvis:  External genitalia: normal general appearance Urinary system: urethral meatus normal and bladder without fullness, nontender Vaginal: normal without tenderness, induration or masses Cervix: normal appearance Adnexa: normal bimanual exam Uterus: anteverted and non-tender, normal size   Lab Review Urine pregnancy test Labs reviewed yes Radiologic studies reviewed yes  50% of 20 min visit spent on counseling and coordination of care.   Assessment and Plan:     1. Women's annual routine gynecological examination  2. Screening for cervical cancer Rx: - Cytology - PAP  3. Irregular periods/menstrual cycles Rx: - POCT urine pregnancy  4. Depression, unspecified depression type - stable clinically  5. Screening for STD (sexually transmitted disease) Rx: - Hepatitis C antibody - Hepatitis B surface antigen - HIV antibody - RPR - Cervicovaginal ancillary only  6. Encounter for other general counseling and advice on contraception - wants OCP's  80. Encounter for initial prescription of contraceptive pills Rx: - drospirenone-ethinyl estradiol (YAZ) 3-0.02 MG tablet; Take 1 tablet by mouth daily.  Dispense: 1 Package; Refill: 11    Plan:    Education reviewed: calcium supplements, depression evaluation, low fat, low cholesterol diet, safe sex/STD prevention, self breast exams and weight bearing exercise. Contraception: OCP (estrogen/progesterone). Follow up in: 3 months.   Meds ordered this encounter  Medications  . drospirenone-ethinyl estradiol (YAZ) 3-0.02 MG tablet    Sig: Take 1 tablet by mouth daily.    Dispense:  1 Package    Refill:  11   Orders Placed This Encounter  Procedures  . Hepatitis C antibody  .  Hepatitis B surface antigen  . HIV antibody  . RPR  . POCT urine pregnancy    Shelly Bombard MD 06-14-2017

## 2017-06-15 LAB — HIV ANTIBODY (ROUTINE TESTING W REFLEX): HIV SCREEN 4TH GENERATION: NONREACTIVE

## 2017-06-15 LAB — CERVICOVAGINAL ANCILLARY ONLY
Bacterial vaginitis: POSITIVE — AB
CANDIDA VAGINITIS: NEGATIVE
Chlamydia: POSITIVE — AB
Neisseria Gonorrhea: NEGATIVE
TRICH (WINDOWPATH): NEGATIVE

## 2017-06-15 LAB — CYTOLOGY - PAP: DIAGNOSIS: NEGATIVE

## 2017-06-15 LAB — HEPATITIS C ANTIBODY

## 2017-06-15 LAB — RPR: RPR Ser Ql: NONREACTIVE

## 2017-06-15 LAB — HEPATITIS B SURFACE ANTIGEN: HEP B S AG: NEGATIVE

## 2017-06-16 ENCOUNTER — Other Ambulatory Visit: Payer: Self-pay | Admitting: Obstetrics

## 2017-06-16 DIAGNOSIS — N760 Acute vaginitis: Secondary | ICD-10-CM

## 2017-06-16 DIAGNOSIS — A568 Sexually transmitted chlamydial infection of other sites: Secondary | ICD-10-CM

## 2017-06-16 DIAGNOSIS — B9689 Other specified bacterial agents as the cause of diseases classified elsewhere: Secondary | ICD-10-CM

## 2017-06-16 MED ORDER — CLINDAMYCIN HCL 300 MG PO CAPS: 300.0000 mg | ORAL_CAPSULE | Freq: Three times a day (TID) | ORAL | 0 refills | Status: DC

## 2017-06-16 MED ORDER — AZITHROMYCIN 500 MG PO TABS: 1000.0000 mg | ORAL_TABLET | Freq: Once | ORAL | 0 refills | Status: AC

## 2017-06-16 MED ORDER — CEFIXIME 400 MG PO CAPS: 400.0000 mg | ORAL_CAPSULE | Freq: Once | ORAL | 0 refills | Status: AC

## 2017-07-16 ENCOUNTER — Ambulatory Visit: Payer: Self-pay | Admitting: Obstetrics

## 2017-07-27 ENCOUNTER — Telehealth: Payer: Self-pay | Admitting: Obstetrics

## 2017-07-27 MED ORDER — AZITHROMYCIN 500 MG PO TABS: ORAL_TABLET | ORAL | 0 refills | Status: DC

## 2017-07-27 NOTE — Telephone Encounter (Signed)
Patient called regarding the antibiotic that was sent to her 06/16/17 for treatment of chlamydia.  She stated she did not get it filled because it was too expensive.  Azithromycin 1gram sent to pharmacy per protocol.  Instructed patient to abstain from sex for seven day post treatment and to notify her partner.  Patient states she does not currently have a partner.

## 2018-05-31 ENCOUNTER — Other Ambulatory Visit: Payer: Self-pay

## 2018-05-31 ENCOUNTER — Encounter (HOSPITAL_COMMUNITY): Payer: Self-pay | Admitting: Emergency Medicine

## 2018-05-31 ENCOUNTER — Emergency Department (HOSPITAL_COMMUNITY)
Admission: EM | Admit: 2018-05-31 | Discharge: 2018-05-31 | Disposition: A | Discharge status: Home / Self Care | Payer: Medicaid Other | Attending: Emergency Medicine | Admitting: Emergency Medicine

## 2018-05-31 DIAGNOSIS — R102 Pelvic and perineal pain: Secondary | ICD-10-CM | POA: Insufficient documentation

## 2018-05-31 DIAGNOSIS — Z5321 Procedure and treatment not carried out due to patient leaving prior to being seen by health care provider: Secondary | ICD-10-CM | POA: Insufficient documentation

## 2018-05-31 NOTE — ED Notes (Signed)
Adds that she moved back from [facility] about week ago.

## 2018-05-31 NOTE — ED Triage Notes (Signed)
Diagnosed with anemia and taking iron pills.  Reports having pelvic pains and RLQ pains x week.  Reports when her cycle came on was big clots and only for couple days. Denies having a PCP.

## 2018-06-19 ENCOUNTER — Other Ambulatory Visit: Payer: Self-pay

## 2018-06-19 ENCOUNTER — Encounter (HOSPITAL_COMMUNITY): Payer: Self-pay

## 2018-06-19 ENCOUNTER — Emergency Department (HOSPITAL_COMMUNITY)
Admission: EM | Admit: 2018-06-19 | Discharge: 2018-06-20 | Disposition: A | Discharge status: Home / Self Care | Payer: Self-pay | Attending: Emergency Medicine | Admitting: Emergency Medicine

## 2018-06-19 DIAGNOSIS — Z7251 High risk heterosexual behavior: Secondary | ICD-10-CM | POA: Insufficient documentation

## 2018-06-19 DIAGNOSIS — B373 Candidiasis of vulva and vagina: Secondary | ICD-10-CM | POA: Insufficient documentation

## 2018-06-19 DIAGNOSIS — J45909 Unspecified asthma, uncomplicated: Secondary | ICD-10-CM | POA: Insufficient documentation

## 2018-06-19 DIAGNOSIS — B3731 Acute candidiasis of vulva and vagina: Secondary | ICD-10-CM

## 2018-06-19 DIAGNOSIS — L245 Irritant contact dermatitis due to other chemical products: Secondary | ICD-10-CM | POA: Insufficient documentation

## 2018-06-19 DIAGNOSIS — F17228 Nicotine dependence, chewing tobacco, with other nicotine-induced disorders: Secondary | ICD-10-CM | POA: Insufficient documentation

## 2018-06-19 NOTE — ED Triage Notes (Signed)
Pt complains of a rash on her legs from Carlton Adam and also wants to be checked for chlamydia

## 2018-06-20 LAB — WET PREP, GENITAL
Sperm: NONE SEEN
Trich, Wet Prep: NONE SEEN

## 2018-06-20 LAB — GC/CHLAMYDIA PROBE AMP (~~LOC~~) NOT AT ARMC
Chlamydia: POSITIVE — AB
Neisseria Gonorrhea: POSITIVE — AB

## 2018-06-20 MED ORDER — PREDNISONE 20 MG PO TABS
60.0000 mg | ORAL_TABLET | Freq: Once | ORAL | Status: AC
  Administered 2018-06-20: 60 mg via ORAL
  Filled 2018-06-20: qty 3

## 2018-06-20 MED ORDER — FLUCONAZOLE 150 MG PO TABS
150.0000 mg | ORAL_TABLET | Freq: Once | ORAL | Status: AC
  Administered 2018-06-20: 01:00:00 150 mg via ORAL
  Filled 2018-06-20: qty 1

## 2018-06-20 MED ORDER — ACETAMINOPHEN 325 MG PO TABS
650.0000 mg | ORAL_TABLET | Freq: Once | ORAL | Status: AC
  Administered 2018-06-20: 01:00:00 650 mg via ORAL
  Filled 2018-06-20: qty 2

## 2018-06-20 MED ORDER — HYDROCORTISONE 2.5 % EX LOTN: TOPICAL_LOTION | Freq: Two times a day (BID) | CUTANEOUS | 0 refills | Status: DC

## 2018-06-20 NOTE — Discharge Instructions (Addendum)
Use hydrocortisone lotion as prescribed for management of your rash. You may also benefit from use of benadryl lotion which can be purchased at your local pharmacy.  Use Tylenol or ibuprofen for management of discomfort.  Your work up today showed findings consistent with yeast vaginitis. You were treated for this with a tablet of Diflucan while in the ED. Follow-up with the health department in 48 hours for the results of your STD tests. If you test positive for STDs, notify all sexual partners of their need to be tested and treated as well. If requiring STD treatment, do not engage in sexual intercourse for one week after treatment. Use a condom when sexually active.

## 2018-06-20 NOTE — ED Provider Notes (Signed)
[facility] DEPT Provider Note   CSN: [id number] Arrival date & time: 06/19/18  2341    History   Chief Complaint Chief Complaint  Patient presents with  . Rash    HPI Amy Dougherty is a 23 y.o. female.     23 year old female presents to the emergency department for complaints of a rash to her bilateral lower extremities.  This began today after using Carlton Adam.  She has been experiencing a discomfort to her legs since [employer] application.  Has tried soothing the area with topical Vaseline and oil without relief.  Denies any other new soaps, lotions, detergents, topical contacts.  Patient also complaining of an episode of menorrhagia in April.  She states that her menstrual cycle was heavier at this time with clotting.  Her menses lasted 3 days.  She reports looking on [employer] where it informed her that an abnormal period may be due to an STD.  She believes that she has chlamydia.  She has not been experiencing any other vaginal discharge, genital sores, genital lesions, urinary symptoms..  Does endorse history of unprotected sex as well as a remote history of gonorrhea.  The history is provided by the patient. No language interpreter was used.  Rash    Past Medical History:  Diagnosis Date  . Herpes genitalis in women    currently taking medications  . History of blood clots    Blood clot in  Right overy after delivery  . Wisdom teeth extracted     Patient Active Problem List   Diagnosis Date Noted  . Bipolar 1 disorder, depressed, severe ([place]) 11/05/2013  . Oral herpes simplex infection 05/22/2013  . Gonorrhea 05/22/2013  . Candidiasis of perineum 05/22/2013  . Dermatofibroma of neck 05/22/2013  . History of eczema 05/22/2013  . Genital herpes 05/16/2013  . Chlamydia vaginitis/cervicitis 12/14/2012  . Molluscum contagiosum infection 11/29/2012  . GERD without esophagitis 10/10/2012  . Contraceptive management 06/25/2012  . Dysfunctional  uterine bleeding 08/26/2011  . Normocytic anemia 08/26/2011  . History of venous thrombosis 03/09/2010    Past Surgical History:  Procedure Laterality Date  . WISDOM TOOTH EXTRACTION       OB History    Gravida  3   Para  2   Term  2   Preterm  0   AB  1   Living  2     SAB  1   TAB  0   Ectopic  0   Multiple  0   Live Births  2            Home Medications    Prior to Admission medications   Medication Sig Start Date End Date Taking? Authorizing Provider  acetaminophen (TYLENOL) 500 MG tablet Take 500 mg by mouth every 6 (six) hours as needed for mild pain or headache.     [provider]  azithromycin (ZITHROMAX) 500 MG tablet Take two tablets once 07/27/17   Shelly Bombard, MD  clindamycin (CLEOCIN) 300 MG capsule Take 1 capsule (300 mg total) by mouth 3 (three) times daily. 06/16/17   Shelly Bombard, MD  drospirenone-ethinyl estradiol (YAZ) 3-0.02 MG tablet Take 1 tablet by mouth daily. 06/14/17   Shelly Bombard, MD  hydrocortisone 2.5 % lotion Apply topically 2 (two) times daily. Apply to affected area/rash. Avoid use on face. 06/20/18   Antonietta Breach, PA-C  ibuprofen (ADVIL,MOTRIN) 200 MG tablet Take 200 mg by mouth every 6 (six) hours as  needed for fever or moderate pain.     [provider]  PRESCRIPTION MEDICATION Take 1 tablet by mouth daily. Birth control. Not sure of name    [provider]  cetirizine (ZYRTEC) 10 MG tablet Take 10 mg by mouth daily as needed. For cold symptoms  04/25/11  [provider]    Family History Family History  Problem Relation Age of Onset  . Asthma Brother     Social History Social History   Tobacco Use  . Smoking status: Never Smoker  . Smokeless tobacco: Current User  Substance Use Topics  . Alcohol use: Yes    Alcohol/week: 0.0 standard drinks    Comment: Pt reports drinking wine and beer at least 3-4 days per week. Pt reports increased used over the past couple of  months.  . Drug use: No     Allergies   Flagyl [metronidazole]; Apple; Cherry; Ibuprofen; Norethindrone; and Other   Review of Systems Review of Systems  Skin: Positive for rash.  Ten systems reviewed and are negative for acute change, except as noted in the HPI.    Physical Exam Updated Vital Signs BP (!) 155/108 (BP Location: Left Arm)   Pulse (!) 104   Temp 97.9 F (36.6 C) (Oral)   Resp 16   LMP 05/23/2018   SpO2 100%   Physical Exam Vitals signs and nursing note reviewed.  Constitutional:      General: She is not in acute distress.    Appearance: She is well-developed. She is not diaphoretic.     Comments: Nontoxic appearing and in NAD  HENT:     Head: Normocephalic and atraumatic.  Eyes:     General: No scleral icterus.    Conjunctiva/sclera: Conjunctivae normal.  Neck:     Musculoskeletal: Normal range of motion.  Pulmonary:     Effort: Pulmonary effort is normal. No respiratory distress.     Comments: Respirations even and unlabored Genitourinary:    Comments: Exam chaperoned by [place], Therapist, sports.  Patient with white, clumpy vaginal discharge.  No genital lesions or sores. Musculoskeletal: Normal range of motion.  Skin:    General: Skin is warm and dry.     Coloration: Skin is not pale.     Comments: Slightly raised, erythematous, blanching maculopapular rash to the anterior aspects of bilateral lower extremities consistent with contact dermatitis.  Neurological:     Mental Status: She is alert and oriented to person, place, and time.     Comments: Moving all extremities spontaneously  Psychiatric:        Behavior: Behavior normal.     Comments: Calm and cooperative      ED Treatments / Results  Labs (all labs ordered are listed, but only abnormal results are displayed) Labs Reviewed  WET PREP, GENITAL - Abnormal; Notable for the following components:      Result Value   Yeast Wet Prep HPF POC PRESENT (*)    Clue Cells Wet Prep HPF POC PRESENT (*)     WBC, Wet Prep HPF POC FEW (*)    All other components within normal limits  GC/CHLAMYDIA PROBE AMP ([place]) NOT AT [facility]    EKG None  Radiology No results found.  Procedures Procedures (including critical care time)  Medications Ordered in ED Medications  acetaminophen (TYLENOL) tablet 650 mg (650 mg Oral Given 06/20/18 0034)  predniSONE (DELTASONE) tablet 60 mg (60 mg Oral Given 06/20/18 0034)  fluconazole (DIFLUCAN) tablet 150 mg (150 mg Oral  Given 06/20/18 0056)     Initial Impression / Assessment and Plan / ED Course  I have reviewed the triage vital signs and the nursing notes.  Pertinent labs & imaging results that were available during my care of the patient were reviewed by me and considered in my medical decision making (see chart for details).        23 year old female presents for rash to her bilateral lower extremities after using Carlton Adam.  Her physical exam is consistent with contact dermatitis to her anterior bilateral lower extremities.  She was given a tablet of prednisone in the ED.  We will discharge with hydrocortisone lotion.  Patient also expressing concern for chlamydia given remote history of unprotected sex.  Complains of menorrhagia 1 month ago.  Her pelvic was notable for a thick, clumpy white discharge.  Wet prep shows yeast.  Physical exam and work-up consistent with candidal vaginitis.  Patient given tablet of Diflucan in the ED.  No indication for prophylactic STD testing.  She has been instructed to follow-up with the health department regarding the results of her STD tests.  Return precautions discussed and provided. Patient discharged in stable condition with no unaddressed concerns.   Final Clinical Impressions(s) / ED Diagnoses   Final diagnoses:  Yeast vaginitis  Irritant contact dermatitis due to other chemical products  History of unprotected sex    ED Discharge Orders         Ordered    hydrocortisone 2.5 % lotion  2 times daily      06/20/18 0050           Antonietta Breach, PA-C 06/20/18 0153    Veryl Speak, MD 06/20/18 [phone]

## 2018-06-24 ENCOUNTER — Other Ambulatory Visit: Payer: Self-pay

## 2018-06-24 ENCOUNTER — Emergency Department (HOSPITAL_COMMUNITY)
Admission: EM | Admit: 2018-06-24 | Discharge: 2018-06-24 | Disposition: A | Discharge status: Home / Self Care | Payer: Medicaid Other | Attending: Emergency Medicine | Admitting: Emergency Medicine

## 2018-06-24 ENCOUNTER — Encounter (HOSPITAL_COMMUNITY): Payer: Self-pay

## 2018-06-24 DIAGNOSIS — A549 Gonococcal infection, unspecified: Secondary | ICD-10-CM

## 2018-06-24 DIAGNOSIS — Z79899 Other long term (current) drug therapy: Secondary | ICD-10-CM | POA: Insufficient documentation

## 2018-06-24 DIAGNOSIS — A749 Chlamydial infection, unspecified: Secondary | ICD-10-CM | POA: Insufficient documentation

## 2018-06-24 DIAGNOSIS — F17290 Nicotine dependence, other tobacco product, uncomplicated: Secondary | ICD-10-CM | POA: Insufficient documentation

## 2018-06-24 LAB — PREGNANCY, URINE: Preg Test, Ur: NEGATIVE

## 2018-06-24 MED ORDER — LIDOCAINE HCL 1 % IJ SOLN
INTRAMUSCULAR | Status: AC
  Administered 2018-06-24: 20 mL
  Filled 2018-06-24: qty 20

## 2018-06-24 MED ORDER — CEFTRIAXONE SODIUM 250 MG IJ SOLR
250.0000 mg | Freq: Once | INTRAMUSCULAR | Status: AC
  Administered 2018-06-24: 250 mg via INTRAMUSCULAR
  Filled 2018-06-24: qty 250

## 2018-06-24 MED ORDER — AZITHROMYCIN 1 G PO PACK
1.0000 g | PACK | Freq: Once | ORAL | Status: AC
  Administered 2018-06-24: 1 g via ORAL
  Filled 2018-06-24: qty 1

## 2018-06-24 NOTE — ED Provider Notes (Signed)
[facility] DEPT Provider Note   CSN: [id number] Arrival date & time: 06/24/18  1751    History   Chief Complaint Chief Complaint  Patient presents with  . abnormal labs    HPI Amy Dougherty is a 23 y.o. female.     Patient is a 23 year old female with past medical history of genital herpes, bipolar disorder, who presents emergency department looking for treatment for STDs.  Patient was seen in the emergency department a few days ago and had a pelvic exam and STD testing at that time.  Her results for gonorrhea and chlamydia returned positive.  Patient reports that she is having some mild vaginal discharge.  Denies any abdominal pain, fever, nausea, vomiting.  Patient reports to the nurse that she had 5 sexual partners in [facility] recently.     Past Medical History:  Diagnosis Date  . Herpes genitalis in women    currently taking medications  . History of blood clots    Blood clot in  Right overy after delivery  . Wisdom teeth extracted     Patient Active Problem List   Diagnosis Date Noted  . Bipolar 1 disorder, depressed, severe ([place]) 11/05/2013  . Oral herpes simplex infection 05/22/2013  . Gonorrhea 05/22/2013  . Candidiasis of perineum 05/22/2013  . Dermatofibroma of neck 05/22/2013  . History of eczema 05/22/2013  . Genital herpes 05/16/2013  . Chlamydia vaginitis/cervicitis 12/14/2012  . Molluscum contagiosum infection 11/29/2012  . GERD without esophagitis 10/10/2012  . Contraceptive management 06/25/2012  . Dysfunctional uterine bleeding 08/26/2011  . Normocytic anemia 08/26/2011  . History of venous thrombosis 03/09/2010    Past Surgical History:  Procedure Laterality Date  . WISDOM TOOTH EXTRACTION       OB History    Gravida  3   Para  2   Term  2   Preterm  0   AB  1   Living  2     SAB  1   TAB  0   Ectopic  0   Multiple  0   Live Births  2            Home Medications    Prior to  Admission medications   Medication Sig Start Date End Date Taking? Authorizing Provider  acetaminophen (TYLENOL) 500 MG tablet Take 500 mg by mouth every 6 (six) hours as needed for mild pain or headache.     [provider]  azithromycin (ZITHROMAX) 500 MG tablet Take two tablets once 07/27/17   Shelly Bombard, MD  clindamycin (CLEOCIN) 300 MG capsule Take 1 capsule (300 mg total) by mouth 3 (three) times daily. 06/16/17   Shelly Bombard, MD  drospirenone-ethinyl estradiol (YAZ) 3-0.02 MG tablet Take 1 tablet by mouth daily. 06/14/17   Shelly Bombard, MD  hydrocortisone 2.5 % lotion Apply topically 2 (two) times daily. Apply to affected area/rash. Avoid use on face. 06/20/18   Antonietta Breach, PA-C  ibuprofen (ADVIL,MOTRIN) 200 MG tablet Take 200 mg by mouth every 6 (six) hours as needed for fever or moderate pain.     [provider]  PRESCRIPTION MEDICATION Take 1 tablet by mouth daily. Birth control. Not sure of name    [provider]  cetirizine (ZYRTEC) 10 MG tablet Take 10 mg by mouth daily as needed. For cold symptoms  04/25/11  [provider]    Family History Family History  Problem Relation Age of Onset  .  Asthma Brother     Social History Social History   Tobacco Use  . Smoking status: Never Smoker  . Smokeless tobacco: Current User  Substance Use Topics  . Alcohol use: Yes    Alcohol/week: 0.0 standard drinks    Comment: Pt reports drinking wine and beer at least 3-4 days per week. Pt reports increased used over the past couple of months.  . Drug use: No     Allergies   Flagyl [metronidazole]; Apple; Cherry; Ibuprofen; Norethindrone; and Other   Review of Systems Review of Systems  Constitutional: Negative for fever.  Respiratory: Negative for shortness of breath.   Gastrointestinal: Negative for abdominal pain, nausea and vomiting.  Genitourinary: Positive for vaginal discharge. Negative for difficulty urinating, dysuria,  hematuria and vaginal bleeding.  Neurological: Negative for light-headedness and headaches.     Physical Exam Updated Vital Signs BP (!) 144/103 (BP Location: Left Arm)   Pulse (!) 104   Temp 98.4 F (36.9 C) (Oral)   Resp 16   Ht 5\' 7"  (1.702 m)   Wt 89.4 kg   LMP 05/25/2018   SpO2 100%   BMI 30.85 kg/m   Physical Exam Vitals signs and nursing note reviewed.  Constitutional:      General: She is not in acute distress.    Appearance: Normal appearance. She is obese. She is not ill-appearing, toxic-appearing or diaphoretic.  HENT:     Head: Normocephalic.  Eyes:     Conjunctiva/sclera: Conjunctivae normal.  Pulmonary:     Effort: Pulmonary effort is normal.  Skin:    General: Skin is dry.  Neurological:     Mental Status: She is alert.  Psychiatric:        Mood and Affect: Mood normal.      ED Treatments / Results  Labs (all labs ordered are listed, but only abnormal results are displayed) Labs Reviewed  PREGNANCY, URINE    EKG None  Radiology No results found.  Procedures Procedures (including critical care time)  Medications Ordered in ED Medications  azithromycin (ZITHROMAX) powder 1 g (1 g Oral Given 06/24/18 1939)  cefTRIAXone (ROCEPHIN) injection 250 mg (250 mg Intramuscular Given 06/24/18 1939)  lidocaine (XYLOCAINE) 1 % (with pres) injection (20 mLs  Given 06/24/18 1940)     Initial Impression / Assessment and Plan / ED Course  I have reviewed the triage vital signs and the nursing notes.  Pertinent labs & imaging results that were available during my care of the patient were reviewed by me and considered in my medical decision making (see chart for details).  Clinical Course as of Jun 24 2019  Mon Jun 24, 2018  3235 Patient advised on safe sex practices.  She is advised no sexual intercourse until she is retested and her urine is negative.  She was advised that she needs to let all of her sexual partners know in the last couple months that  she tested positive for gonorrhea and chlamydia so that they may be tested and treated as well.   [KM]    Clinical Course User Index [KM] Alveria Apley, PA-C       Based on review of vitals, medical screening exam, lab work and/or imaging, there does not appear to be an acute, emergent etiology for the patient's symptoms. Counseled pt on good return precautions and encouraged both PCP and ED follow-up as needed.  Prior to discharge, I also discussed incidental imaging findings with patient in detail and advised appropriate, recommended  follow-up in detail.  Clinical Impression: 1. Gonorrhea   2. Chlamydia     Disposition: Discharge  Prior to providing a prescription for a controlled substance, I independently reviewed the patient's recent prescription history on the [facility]. The patient had no recent or regular prescriptions and was deemed appropriate for a brief, less than 3 day prescription of narcotic for acute analgesia.  This note was prepared with assistance of Systems analyst. Occasional wrong-word or sound-a-like substitutions may have occurred due to the inherent limitations of voice recognition software.   Final Clinical Impressions(s) / ED Diagnoses   Final diagnoses:  Gonorrhea  Chlamydia    ED Discharge Orders    None       Kristine Royal 06/24/18 2021    Dorie Rank, MD 06/27/18 [phone]

## 2018-06-24 NOTE — Discharge Instructions (Signed)
Absolutely no sexual intercourse until your urine is free tested and your results are negative.  Let all of your sexual partners know the last couple of months that you tested positive so they may be tested and treated as well. Thank you for allowing me to care for you today. Please return to the emergency department if you have new or worsening symptoms. Take your medications as instructed.

## 2018-06-24 NOTE — ED Triage Notes (Signed)
Patient states she was told by the health department that she was positive for gonorrhea and chlmydia. Patient states she was told to come to the health department or come to the ED for treatment.

## 2018-06-24 NOTE — ED Notes (Signed)
Pt yelled, "I have to go, I can't stay". Pt ran out the door before anyone could speak with pt

## 2019-06-29 ENCOUNTER — Other Ambulatory Visit: Payer: Self-pay

## 2019-06-29 ENCOUNTER — Emergency Department (HOSPITAL_COMMUNITY)
Admission: EM | Admit: 2019-06-29 | Discharge: 2019-06-29 | Disposition: A | Discharge status: Home / Self Care | Payer: Medicaid Other | Attending: Emergency Medicine | Admitting: Emergency Medicine

## 2019-06-29 DIAGNOSIS — A6009 Herpesviral infection of other urogenital tract: Secondary | ICD-10-CM

## 2019-06-29 DIAGNOSIS — B9689 Other specified bacterial agents as the cause of diseases classified elsewhere: Secondary | ICD-10-CM

## 2019-06-29 DIAGNOSIS — Z202 Contact with and (suspected) exposure to infections with a predominantly sexual mode of transmission: Secondary | ICD-10-CM

## 2019-06-29 DIAGNOSIS — A549 Gonococcal infection, unspecified: Secondary | ICD-10-CM | POA: Insufficient documentation

## 2019-06-29 DIAGNOSIS — J45909 Unspecified asthma, uncomplicated: Secondary | ICD-10-CM | POA: Insufficient documentation

## 2019-06-29 DIAGNOSIS — A6000 Herpesviral infection of urogenital system, unspecified: Secondary | ICD-10-CM | POA: Insufficient documentation

## 2019-06-29 DIAGNOSIS — F17290 Nicotine dependence, other tobacco product, uncomplicated: Secondary | ICD-10-CM | POA: Insufficient documentation

## 2019-06-29 DIAGNOSIS — N760 Acute vaginitis: Secondary | ICD-10-CM | POA: Insufficient documentation

## 2019-06-29 LAB — URINALYSIS, ROUTINE W REFLEX MICROSCOPIC
Bilirubin Urine: NEGATIVE
Glucose, UA: NEGATIVE mg/dL
Hgb urine dipstick: NEGATIVE
Ketones, ur: NEGATIVE mg/dL
Nitrite: NEGATIVE
Protein, ur: NEGATIVE mg/dL
Specific Gravity, Urine: 1.009 (ref 1.005–1.030)
pH: 6 (ref 5.0–8.0)

## 2019-06-29 LAB — WET PREP, GENITAL
Sperm: NONE SEEN
Trich, Wet Prep: NONE SEEN
Yeast Wet Prep HPF POC: NONE SEEN

## 2019-06-29 LAB — POC URINE PREG, ED: Preg Test, Ur: NEGATIVE

## 2019-06-29 LAB — HIV ANTIBODY (ROUTINE TESTING W REFLEX): HIV Screen 4th Generation wRfx: NONREACTIVE

## 2019-06-29 MED ORDER — STERILE WATER FOR INJECTION IJ SOLN
INTRAMUSCULAR | Status: AC
  Administered 2019-06-29: 2.1 mL
  Filled 2019-06-29: qty 10

## 2019-06-29 MED ORDER — VALACYCLOVIR HCL 1 G PO TABS: 1000.0000 mg | ORAL_TABLET | Freq: Two times a day (BID) | ORAL | 0 refills | Status: AC

## 2019-06-29 MED ORDER — ONDANSETRON 4 MG PO TBDP: 4.0000 mg | ORAL_TABLET | Freq: Three times a day (TID) | ORAL | 0 refills | Status: DC | PRN

## 2019-06-29 MED ORDER — CEFTRIAXONE SODIUM 1 G IJ SOLR
500.0000 mg | Freq: Once | INTRAMUSCULAR | Status: AC
  Administered 2019-06-29: 500 mg via INTRAMUSCULAR
  Filled 2019-06-29: qty 10

## 2019-06-29 MED ORDER — ONDANSETRON 4 MG PO TBDP
4.0000 mg | ORAL_TABLET | Freq: Once | ORAL | Status: AC
  Administered 2019-06-29: 4 mg via ORAL
  Filled 2019-06-29: qty 1

## 2019-06-29 MED ORDER — DOXYCYCLINE HYCLATE 100 MG PO TABS
100.0000 mg | ORAL_TABLET | Freq: Once | ORAL | Status: AC
  Administered 2019-06-29: 100 mg via ORAL
  Filled 2019-06-29: qty 1

## 2019-06-29 MED ORDER — CLINDAMYCIN HCL 300 MG PO CAPS: 300.0000 mg | ORAL_CAPSULE | Freq: Two times a day (BID) | ORAL | 0 refills | Status: AC

## 2019-06-29 MED ORDER — CLINDAMYCIN HCL 300 MG PO CAPS
300.0000 mg | ORAL_CAPSULE | Freq: Once | ORAL | Status: AC
  Administered 2019-06-29: 300 mg via ORAL
  Filled 2019-06-29: qty 1

## 2019-06-29 MED ORDER — DOXYCYCLINE HYCLATE 100 MG PO CAPS: 100.0000 mg | ORAL_CAPSULE | Freq: Two times a day (BID) | ORAL | 0 refills | Status: AC

## 2019-06-29 NOTE — ED Provider Notes (Signed)
[facility] DEPT Provider Note   CSN: [id number] Arrival date & time: 06/29/19  1249     History Chief Complaint  Patient presents with  . Exposure to STD    Amy Dougherty is a 24 y.o. female with a past medical history significant for bipolar 1 disorder, history of gonorrhea and chlamydia, GERD, and history of venous thrombosis who presents to the ED due to exposure to gonorrhea, chlamydia, and trichomonas.  Patient notes her boyfriend just recently tested positive.  She denies any vaginal discharge, dysuria, abdominal pain, pelvic pain.  She denies any physical complaints.  Her last menstrual cycle was the beginning of April.  She is not currently on any birth control.  She is sexually active with 2 partners 1 with protection in 1 without protection.  Denies fever and chills.  History obtained from patient and past medical records. No interpreter used during encounter.      Past Medical History:  Diagnosis Date  . Herpes genitalis in women    currently taking medications  . History of blood clots    Blood clot in  Right overy after delivery  . Wisdom teeth extracted     Patient Active Problem List   Diagnosis Date Noted  . Bipolar 1 disorder, depressed, severe ([place]) 11/05/2013  . Oral herpes simplex infection 05/22/2013  . Gonorrhea 05/22/2013  . Candidiasis of perineum 05/22/2013  . Dermatofibroma of neck 05/22/2013  . History of eczema 05/22/2013  . Genital herpes 05/16/2013  . Chlamydia vaginitis/cervicitis 12/14/2012  . Molluscum contagiosum infection 11/29/2012  . GERD without esophagitis 10/10/2012  . Contraceptive management 06/25/2012  . Dysfunctional uterine bleeding 08/26/2011  . Normocytic anemia 08/26/2011  . History of venous thrombosis 03/09/2010    Past Surgical History:  Procedure Laterality Date  . WISDOM TOOTH EXTRACTION       OB History    Gravida  3   Para  2   Term  2   Preterm  0   AB  1   Living  2      SAB  1   TAB  0   Ectopic  0   Multiple  0   Live Births  2           Family History  Problem Relation Age of Onset  . Asthma Brother     Social History   Tobacco Use  . Smoking status: Never Smoker  . Smokeless tobacco: Current User  Substance Use Topics  . Alcohol use: Yes    Alcohol/week: 0.0 standard drinks    Comment: Pt reports drinking wine and beer at least 3-4 days per week. Pt reports increased used over the past couple of months.  . Drug use: No    Home Medications Prior to Admission medications   Medication Sig Start Date End Date Taking? Authorizing Provider  acetaminophen (TYLENOL) 500 MG tablet Take 500 mg by mouth every 6 (six) hours as needed for mild pain or headache.    Yes [provider]  calcium carbonate (OS-CAL) 1250 (500 Ca) MG chewable tablet Chew 1 tablet by mouth daily as needed for heartburn.    Yes [provider]  diphenhydrAMINE HCl (BENADRYL ALLERGY PO) Take 1 tablet by mouth daily.   Yes [provider]  azithromycin (ZITHROMAX) 500 MG tablet Take two tablets once Patient not taking: Reported on 06/29/2019 07/27/17   Shelly Bombard, MD  clindamycin (CLEOCIN) 300 MG capsule Take 1 capsule (300  mg total) by mouth 3 (three) times daily. Patient not taking: Reported on 06/29/2019 06/16/17   Shelly Bombard, MD  clindamycin (CLEOCIN) 300 MG capsule Take 1 capsule (300 mg total) by mouth 2 (two) times daily for 7 days. 06/29/19 07/06/19  Suzy Bouchard, PA-C  doxycycline (VIBRAMYCIN) 100 MG capsule Take 1 capsule (100 mg total) by mouth 2 (two) times daily for 7 days. 06/29/19 07/06/19  Suzy Bouchard, PA-C  drospirenone-ethinyl estradiol (YAZ) 3-0.02 MG tablet Take 1 tablet by mouth daily. Patient not taking: Reported on 06/29/2019 06/14/17   Shelly Bombard, MD  hydrocortisone 2.5 % lotion Apply topically 2 (two) times daily. Apply to affected area/rash. Avoid use on face. Patient not taking: Reported  on 06/29/2019 06/20/18   Antonietta Breach, PA-C  ondansetron (ZOFRAN ODT) 4 MG disintegrating tablet Take 1 tablet (4 mg total) by mouth every 8 (eight) hours as needed for nausea or vomiting. 06/29/19   Suzy Bouchard, PA-C  valACYclovir (VALTREX) 1000 MG tablet Take 1 tablet (1,000 mg total) by mouth 2 (two) times daily for 7 days. 06/29/19 07/06/19  Suzy Bouchard, PA-C  cetirizine (ZYRTEC) 10 MG tablet Take 10 mg by mouth daily as needed. For cold symptoms  04/25/11  [provider]    Allergies    Flagyl [metronidazole], Apple, Cherry, Ibuprofen, Latex, Norethindrone, and Other  Review of Systems   Review of Systems  Constitutional: Negative for chills and fever.  Gastrointestinal: Negative for abdominal pain, nausea and vomiting.  Genitourinary: Negative for dysuria, pelvic pain and vaginal discharge.  All other systems reviewed and are negative.   Physical Exam Updated Vital Signs BP (!) 147/96 (BP Location: Right Arm)   Pulse 87   Temp 98.6 F (37 C) (Oral)   Resp 18   Ht 5\' 7"  (1.702 m)   Wt 95.3 kg   LMP 05/15/2019   SpO2 100%   BMI 32.89 kg/m   Physical Exam Vitals and nursing note reviewed. Exam conducted with a chaperone present.  Constitutional:      General: She is not in acute distress.    Appearance: She is not ill-appearing.  HENT:     Head: Normocephalic.  Eyes:     Pupils: Pupils are equal, round, and reactive to light.  Cardiovascular:     Rate and Rhythm: Normal rate and regular rhythm.     Pulses: Normal pulses.     Heart sounds: Normal heart sounds. No murmur. No friction rub. No gallop.   Pulmonary:     Effort: Pulmonary effort is normal.     Breath sounds: Normal breath sounds.  Abdominal:     General: Abdomen is flat. Bowel sounds are normal. There is no distension.     Palpations: Abdomen is soft.     Tenderness: There is no abdominal tenderness. There is no right CVA tenderness, left CVA tenderness, guarding or rebound.    Genitourinary:    Exam position: Supine.     Labia:        Right: Lesion present.        Left: Lesion present.      Vagina: Vaginal discharge present. No tenderness.     Cervix: Discharge present. No cervical motion tenderness or cervical bleeding.     Uterus: Normal.      Adnexa: Right adnexa normal and left adnexa normal.       Right: No mass, tenderness or fullness.         Left: No  mass, tenderness or fullness.       Comments: 3 herpes lesions on labia Musculoskeletal:     Cervical back: Neck supple.     Comments: Able to move all 4 extremities without difficulty.   Skin:    General: Skin is warm and dry.  Neurological:     General: No focal deficit present.     Mental Status: She is alert.  Psychiatric:        Mood and Affect: Mood normal.        Behavior: Behavior normal.     ED Results / Procedures / Treatments   Labs (all labs ordered are listed, but only abnormal results are displayed) Labs Reviewed  WET PREP, GENITAL - Abnormal; Notable for the following components:      Result Value   Clue Cells Wet Prep HPF POC PRESENT (*)    WBC, Wet Prep HPF POC TOO NUMEROUS TO COUNT (*)    All other components within normal limits  URINALYSIS, ROUTINE W REFLEX MICROSCOPIC - Abnormal; Notable for the following components:   APPearance CLOUDY (*)    Leukocytes,Ua MODERATE (*)    Bacteria, UA RARE (*)    All other components within normal limits  HIV ANTIBODY (ROUTINE TESTING W REFLEX)  RPR  POC URINE PREG, ED  GC/CHLAMYDIA PROBE AMP ([place]) NOT AT [facility]    EKG None  Radiology No results found.  Procedures Procedures (including critical care time)  Medications Ordered in ED Medications  cefTRIAXone (ROCEPHIN) injection 500 mg (has no administration in time range)  doxycycline (VIBRA-TABS) tablet 100 mg (has no administration in time range)  clindamycin (CLEOCIN) capsule 300 mg (has no administration in time range)  ondansetron (ZOFRAN-ODT) disintegrating  tablet 4 mg (has no administration in time range)    ED Course  I have reviewed the triage vital signs and the nursing notes.  Pertinent labs & imaging results that were available during my care of the patient were reviewed by me and considered in my medical decision making (see chart for details).  Clinical Course as of Jun 29 1506  Sun Jun 29, 2019  1459 Preg Test, Ur: NEGATIVE [CA]    Clinical Course User Index [CA] Karie Kirks   MDM Rules/Calculators/A&P                     24 year old female presents to the ED after positive STD exposures.  Patient notes her boyfriend just tested positive for gonorrhea, chlamydia, trichomonas.  History of past gonorrhea and Chlamydia infection.  She is currently asymptomatic with no physical complaints.  Stable vitals.  Patient no acute distress and non-ill-appearing.  Physical exam reassuring.  Abdomen soft, nondistended, nontender.  Pelvic exam significant for white discharge.  No CMT or adnexal tenderness/masses.  Doubt PID.  Wet prep positive for clue cells and white blood cells.  UA significant for moderate leukocytes.  Suspect contamination given 21-50 squamous cells.  Negative pregnancy test. HIV/RPR pending. Patient treated prophylactically here in the ED for gonorrhea/chlamdyia. Will discharge patient with herpes medication, clindamycin for BV due to allergy to flagyl, and doxycycline per new CDC guidelines. Zofran given at discharge as well. OBGYN number given to patient. Advised patient to follow-up with OBGYN if symptoms do not improve. Strict ED precautions discussed with patient. Patient states understanding and agrees to plan. Patient discharged home in no acute distress and stable vitals.  Final Clinical Impression(s) / ED Diagnoses Final diagnoses:  STD exposure  Herpes genitalis in women  BV (bacterial vaginosis)    Rx / DC Orders ED Discharge Orders         Ordered    valACYclovir (VALTREX) 1000 MG tablet  2 times  daily     06/29/19 1454    clindamycin (CLEOCIN) 300 MG capsule  2 times daily     06/29/19 1501    doxycycline (VIBRAMYCIN) 100 MG capsule  2 times daily     06/29/19 1501    ondansetron (ZOFRAN ODT) 4 MG disintegrating tablet  Every 8 hours PRN     06/29/19 1501           Karie Kirks 06/29/19 1510    Hayden Rasmussen, MD 06/29/19 1907

## 2019-06-29 NOTE — ED Triage Notes (Signed)
Per patient, she was exposed to chlamydia, gonorrhea and trichomoniasis. Patient states her boyfriend tested positive for these.

## 2019-06-29 NOTE — ED Notes (Signed)
Urine Culture also sent with UA

## 2019-06-29 NOTE — Discharge Instructions (Addendum)
As discussed, you were treated here in the ED for gonorrhea and chlamydia.  I am sending you home with herpes medication and BV medication. Take all medication as prescribed.  I have also sent you home with nausea medication given the number of antibiotics you will be on.  I have included the number for OB/GYN.  Please follow-up if symptoms do not improve over the next week.  Return to the ER for new or worsening symptoms.

## 2019-06-30 LAB — RPR: RPR Ser Ql: NONREACTIVE

## 2019-07-01 LAB — GC/CHLAMYDIA PROBE AMP (~~LOC~~) NOT AT ARMC
Chlamydia: NEGATIVE
Comment: NEGATIVE
Comment: NORMAL
Neisseria Gonorrhea: POSITIVE — AB

## 2019-09-05 ENCOUNTER — Other Ambulatory Visit: Payer: Self-pay

## 2019-09-05 ENCOUNTER — Emergency Department (HOSPITAL_COMMUNITY)
Admission: EM | Admit: 2019-09-05 | Discharge: 2019-09-05 | Disposition: A | Discharge status: Home / Self Care | Payer: Medicaid Other | Attending: Emergency Medicine | Admitting: Emergency Medicine

## 2019-09-05 ENCOUNTER — Encounter (HOSPITAL_COMMUNITY): Payer: Self-pay

## 2019-09-05 DIAGNOSIS — A64 Unspecified sexually transmitted disease: Secondary | ICD-10-CM | POA: Insufficient documentation

## 2019-09-05 DIAGNOSIS — Z5321 Procedure and treatment not carried out due to patient leaving prior to being seen by health care provider: Secondary | ICD-10-CM | POA: Insufficient documentation

## 2019-09-05 LAB — URINALYSIS, ROUTINE W REFLEX MICROSCOPIC
Bilirubin Urine: NEGATIVE
Glucose, UA: NEGATIVE mg/dL
Hgb urine dipstick: NEGATIVE
Ketones, ur: NEGATIVE mg/dL
Nitrite: NEGATIVE
Protein, ur: NEGATIVE mg/dL
Specific Gravity, Urine: 1.019 (ref 1.005–1.030)
pH: 5 (ref 5.0–8.0)

## 2019-09-05 LAB — PREGNANCY, URINE: Preg Test, Ur: NEGATIVE

## 2019-09-05 NOTE — ED Triage Notes (Addendum)
Pt states that she believe she has a STD, as her tongue has new white spots on it. Pt admits to a new partner. Pt states that she is several days late on her menstrual cycle. No pain/difficulty voiding. Pt states that she has a rash on her body too.

## 2019-09-05 NOTE — ED Notes (Signed)
Pt eloped from waiting area. Called 3X.  

## 2019-12-26 ENCOUNTER — Encounter (HOSPITAL_COMMUNITY): Payer: Self-pay

## 2019-12-26 ENCOUNTER — Emergency Department (HOSPITAL_COMMUNITY)
Admission: EM | Admit: 2019-12-26 | Discharge: 2019-12-26 | Disposition: A | Discharge status: Home / Self Care | Payer: Medicaid Other | Attending: Emergency Medicine | Admitting: Emergency Medicine

## 2019-12-26 ENCOUNTER — Other Ambulatory Visit: Payer: Self-pay

## 2019-12-26 DIAGNOSIS — Z202 Contact with and (suspected) exposure to infections with a predominantly sexual mode of transmission: Secondary | ICD-10-CM | POA: Insufficient documentation

## 2019-12-26 DIAGNOSIS — Z711 Person with feared health complaint in whom no diagnosis is made: Secondary | ICD-10-CM

## 2019-12-26 DIAGNOSIS — Z91040 Latex allergy status: Secondary | ICD-10-CM | POA: Insufficient documentation

## 2019-12-26 DIAGNOSIS — Z3490 Encounter for supervision of normal pregnancy, unspecified, unspecified trimester: Secondary | ICD-10-CM

## 2019-12-26 DIAGNOSIS — Z3A11 11 weeks gestation of pregnancy: Secondary | ICD-10-CM | POA: Insufficient documentation

## 2019-12-26 DIAGNOSIS — A599 Trichomoniasis, unspecified: Secondary | ICD-10-CM

## 2019-12-26 LAB — URINALYSIS, ROUTINE W REFLEX MICROSCOPIC
Bilirubin Urine: NEGATIVE
Glucose, UA: NEGATIVE mg/dL
Hgb urine dipstick: NEGATIVE
Ketones, ur: NEGATIVE mg/dL
Nitrite: NEGATIVE
Protein, ur: 30 mg/dL — AB
Specific Gravity, Urine: 1.028 (ref 1.005–1.030)
WBC, UA: >50 WBC/hpf — ABNORMAL HIGH (ref 0–5)
pH: 5 (ref 5.0–8.0)

## 2019-12-26 LAB — HEPATITIS PANEL, ACUTE
HCV Ab: NONREACTIVE
Hep A IgM: NONREACTIVE
Hep B C IgM: NONREACTIVE
Hepatitis B Surface Ag: NONREACTIVE

## 2019-12-26 LAB — WET PREP, GENITAL
Clue Cells Wet Prep HPF POC: NONE SEEN
Sperm: NONE SEEN
Yeast Wet Prep HPF POC: NONE SEEN

## 2019-12-26 LAB — PREGNANCY, URINE: Preg Test, Ur: POSITIVE — AB

## 2019-12-26 LAB — RAPID HIV SCREEN (HIV 1/2 AB+AG)
HIV 1/2 Antibodies: NONREACTIVE
HIV-1 P24 Antigen - HIV24: NONREACTIVE

## 2019-12-26 MED ORDER — AZITHROMYCIN 250 MG PO TABS
1000.0000 mg | ORAL_TABLET | Freq: Once | ORAL | Status: AC
  Administered 2019-12-26: 1000 mg via ORAL
  Filled 2019-12-26: qty 4

## 2019-12-26 MED ORDER — METRONIDAZOLE 500 MG PO TABS: 500.0000 mg | ORAL_TABLET | Freq: Two times a day (BID) | ORAL | 0 refills | Status: DC

## 2019-12-26 MED ORDER — CEFTRIAXONE SODIUM 1 G IJ SOLR
500.0000 mg | Freq: Once | INTRAMUSCULAR | Status: AC
  Administered 2019-12-26: 500 mg via INTRAMUSCULAR
  Filled 2019-12-26: qty 10

## 2019-12-26 MED ORDER — STERILE WATER FOR INJECTION IJ SOLN
INTRAMUSCULAR | Status: AC
  Administered 2019-12-26: 10 mL
  Filled 2019-12-26: qty 10

## 2019-12-26 NOTE — ED Notes (Signed)
Date and time results received: 12/26/19 1950 (use smartphrase ".now" to insert current time)  Test: HIV Critical Value: Non reactive  Name of Provider Notified: Kathrynn Humble  Orders Received? Or Actions Taken?: Actions Taken: Provider notified

## 2019-12-26 NOTE — ED Notes (Signed)
Pelvic is setup at bedside

## 2019-12-26 NOTE — ED Triage Notes (Signed)
Patient states she is [redacted] weeks pregnant. Patient states she has been having green/gray cloudy vaginal drainage x 2-3 weeks.

## 2019-12-26 NOTE — Discharge Instructions (Addendum)
We will contact you with the results of your lab work when it is available.  We will also contact you with the results of your urine culture. Follow-up at the [facility] outpatient clinic or the [facility] if you have any abdominal pain, vaginal bleeding,

## 2019-12-26 NOTE — ED Provider Notes (Signed)
[facility] DEPT Provider Note   CSN: [id number] Arrival date & time: 12/26/19  1459     History Chief Complaint  Patient presents with  . [redacted] weeks pregnant  . Possible STD    Amy Dougherty is a 24 y.o. female who is currently pregnant at [redacted] weeks gestation presenting to the ED for vaginal discharge and concern for STDs.  States that she went to her OB/GYN, had a positive pregnancy test and ultrasound which showed that she was pregnant.  Denies any pregnancy related complications.  Had unprotected sexual intercourse about 2 weeks ago and that is when she started having the discharge as well.  No known STD exposures.  Denies any abdominal pain, pelvic pain, vaginal bleeding, dysuria, hematuria, fever or changes to bowel movements.  She is requesting HIV and syphilis testing as well.  HPI     Past Medical History:  Diagnosis Date  . Herpes genitalis in women    currently taking medications  . History of blood clots    Blood clot in  Right overy after delivery  . Wisdom teeth extracted     Patient Active Problem List   Diagnosis Date Noted  . Bipolar 1 disorder, depressed, severe ([place]) 11/05/2013  . Oral herpes simplex infection 05/22/2013  . Gonorrhea 05/22/2013  . Candidiasis of perineum 05/22/2013  . Dermatofibroma of neck 05/22/2013  . History of eczema 05/22/2013  . Genital herpes 05/16/2013  . Chlamydia vaginitis/cervicitis 12/14/2012  . Molluscum contagiosum infection 11/29/2012  . GERD without esophagitis 10/10/2012  . Contraceptive management 06/25/2012  . Dysfunctional uterine bleeding 08/26/2011  . Normocytic anemia 08/26/2011  . History of venous thrombosis 03/09/2010    Past Surgical History:  Procedure Laterality Date  . WISDOM TOOTH EXTRACTION       OB History    Gravida  4   Para  2   Term  2   Preterm  0   AB  1   Living  2     SAB  1   TAB  0   Ectopic  0   Multiple  0   Live Births  2            Family History  Problem Relation Age of Onset  . Asthma Brother     Social History   Tobacco Use  . Smoking status: Never Smoker  . Smokeless tobacco: Never Used  Vaping Use  . Vaping Use: Every day  . Substances: Nicotine, Flavoring  . Devices: hookah  Substance Use Topics  . Alcohol use: Not Currently    Alcohol/week: 0.0 standard drinks  . Drug use: No    Home Medications Prior to Admission medications   Medication Sig Start Date End Date Taking? Authorizing Provider  acetaminophen (TYLENOL) 500 MG tablet Take 500 mg by mouth every 6 (six) hours as needed for mild pain or headache.     [provider]  azithromycin (ZITHROMAX) 500 MG tablet Take two tablets once Patient not taking: Reported on 06/29/2019 07/27/17   Shelly Bombard, MD  calcium carbonate (OS-CAL) 1250 (500 Ca) MG chewable tablet Chew 1 tablet by mouth daily as needed for heartburn.     [provider]  clindamycin (CLEOCIN) 300 MG capsule Take 1 capsule (300 mg total) by mouth 3 (three) times daily. Patient not taking: Reported on 06/29/2019 06/16/17   Shelly Bombard, MD  diphenhydrAMINE HCl (BENADRYL ALLERGY PO) Take 1 tablet by mouth daily.  [provider]  drospirenone-ethinyl estradiol (YAZ) 3-0.02 MG tablet Take 1 tablet by mouth daily. Patient not taking: Reported on 06/29/2019 06/14/17   Shelly Bombard, MD  hydrocortisone 2.5 % lotion Apply topically 2 (two) times daily. Apply to affected area/rash. Avoid use on face. Patient not taking: Reported on 06/29/2019 06/20/18   Antonietta Breach, PA-C  ondansetron (ZOFRAN ODT) 4 MG disintegrating tablet Take 1 tablet (4 mg total) by mouth every 8 (eight) hours as needed for nausea or vomiting. 06/29/19   Suzy Bouchard, PA-C  cetirizine (ZYRTEC) 10 MG tablet Take 10 mg by mouth daily as needed. For cold symptoms  04/25/11  [provider]    Allergies    Flagyl [metronidazole], Apple, Cherry, Ibuprofen, Latex,  Norethindrone, and Other  Review of Systems   Review of Systems  Constitutional: Negative for appetite change, chills and fever.  HENT: Negative for ear pain, rhinorrhea, sneezing and sore throat.   Eyes: Negative for photophobia and visual disturbance.  Respiratory: Negative for cough, chest tightness, shortness of breath and wheezing.   Cardiovascular: Negative for chest pain and palpitations.  Gastrointestinal: Negative for abdominal pain, blood in stool, constipation, diarrhea, nausea and vomiting.  Genitourinary: Positive for vaginal discharge. Negative for dysuria, hematuria and urgency.  Musculoskeletal: Negative for myalgias.  Skin: Negative for rash.  Neurological: Negative for dizziness, weakness and light-headedness.    Physical Exam Updated Vital Signs BP 119/65   Pulse 92   Temp 98.4 F (36.9 C) (Oral)   Resp 16   Ht 5\' 7"  (1.702 m)   Wt 95.3 kg   LMP 09/03/2019   SpO2 100%   BMI 32.89 kg/m   Physical Exam Vitals and nursing note reviewed. Exam conducted with a chaperone present.  Constitutional:      General: She is not in acute distress.    Appearance: She is well-developed.  HENT:     Head: Normocephalic and atraumatic.     Nose: Nose normal.  Eyes:     General: No scleral icterus.       Left eye: No discharge.     Conjunctiva/sclera: Conjunctivae normal.  Cardiovascular:     Rate and Rhythm: Normal rate and regular rhythm.     Heart sounds: Normal heart sounds. No murmur heard.  No friction rub. No gallop.   Pulmonary:     Effort: Pulmonary effort is normal. No respiratory distress.     Breath sounds: Normal breath sounds.  Abdominal:     General: Bowel sounds are normal. There is no distension.     Palpations: Abdomen is soft.     Tenderness: There is no abdominal tenderness. There is no guarding.  Genitourinary:    Vagina: Vaginal discharge present.     Cervix: No cervical motion tenderness.     Uterus: Not tender.      Adnexa:         Right: No tenderness.         Left: No tenderness.       Comments: Pelvic exam: normal external genitalia without evidence of trauma. VULVA: normal appearing vulva with no masses, tenderness or lesion. VAGINA: normal appearing vagina with normal color and thick, yellow/white discharge, no lesions. CERVIX: normal appearing cervix without lesions, cervical motion tenderness absent, cervical os closed with out purulent discharge; No vaginal discharge. Wet prep and DNA probe for chlamydia and GC obtained.   ADNEXA: normal adnexa in size, nontender and no masses UTERUS: uterus is normal size, shape, consistency  and nontender.   Musculoskeletal:        General: Normal range of motion.     Cervical back: Normal range of motion and neck supple.  Skin:    General: Skin is warm and dry.     Findings: No rash.  Neurological:     Mental Status: She is alert.     Motor: No abnormal muscle tone.     Coordination: Coordination normal.     ED Results / Procedures / Treatments   Labs (all labs ordered are listed, but only abnormal results are displayed) Labs Reviewed  URINE CULTURE  WET PREP, GENITAL  URINALYSIS, ROUTINE W REFLEX MICROSCOPIC  PREGNANCY, URINE  RAPID HIV SCREEN (HIV 1/2 AB+AG)  RPR  HEPATITIS PANEL, ACUTE  GC/CHLAMYDIA PROBE AMP ([place]) NOT AT [facility]    EKG None  Radiology No results found.  Procedures Procedures (including critical care time)  Medications Ordered in ED Medications  azithromycin (ZITHROMAX) tablet 1,000 mg (has no administration in time range)  cefTRIAXone (ROCEPHIN) injection 500 mg (has no administration in time range)    ED Course  I have reviewed the triage vital signs and the nursing notes.  Pertinent labs & imaging results that were available during my care of the patient were reviewed by me and considered in my medical decision making (see chart for details).    MDM Rules/Calculators/A&P                          24 year old female  who is currently pregnant at [redacted] weeks gestation presenting to the ED for vaginal discharge and concern for STD.  Had a positive pregnancy test at her OB/GYN office and ultrasound confirmed.  Denies any pregnancy related complications.  Reports unprotected sexual intercourse about 2 weeks ago which is when she started having discharge as well.  No urinary symptoms, fever, abdominal pain, pelvic pain or vaginal bleeding.  Pelvic exam with thick, white vaginal discharge without cervical motion tenderness, adnexal tenderness or uterine tenderness.  Will obtain swabs for GC chlamydia, wet prep and obtain HIV and syphilis testing per her request.  Also check urinalysis and obtain a documented  pregnancy test here in our system.  I spoke to her [facility] provider over the phone regarding patient's exam.  Her cervix was unremarkable she did have moderate amount of discharge.  Patient initially told me she did not have any known STD exposure but then tells me that the partner that she had intercourse with had chlamydia in the past.  She does not know if they currently have chlamydia.  I reviewed her chart and she has been positive for both gonorrhea and chlamydia in the past.  She is concerned for the same and would like to be treated.  She states that this was a consensual encounter.   I have ordered Rocephin, azithromycin as recommended for pregnancy. Handed off to oncoming provider pending wet prep results as well as urinalysis.  She will be discharged with instructions to follow-up on her lab work.   Portions of this note were generated with [employer]. Dictation errors may occur despite best attempts at proofreading.  Final Clinical Impression(s) / ED Diagnoses Final diagnoses:  Concern about STD in female without diagnosis    Rx / DC Orders ED Discharge Orders    None       Delia Heady, PA-C 12/26/19 Aguada, Ankit, MD 12/26/19 1942

## 2019-12-27 LAB — RPR: RPR Ser Ql: NONREACTIVE

## 2019-12-28 LAB — URINE CULTURE: Culture: <10000 — AB

## 2019-12-29 LAB — GC/CHLAMYDIA PROBE AMP (~~LOC~~) NOT AT ARMC
Chlamydia: NEGATIVE
Comment: NEGATIVE
Comment: NORMAL
Neisseria Gonorrhea: NEGATIVE

## 2020-01-05 ENCOUNTER — Other Ambulatory Visit: Payer: Self-pay

## 2020-01-05 ENCOUNTER — Encounter (HOSPITAL_COMMUNITY): Payer: Self-pay

## 2020-01-05 DIAGNOSIS — A599 Trichomoniasis, unspecified: Secondary | ICD-10-CM | POA: Insufficient documentation

## 2020-01-05 DIAGNOSIS — O23591 Infection of other part of genital tract in pregnancy, first trimester: Secondary | ICD-10-CM | POA: Insufficient documentation

## 2020-01-05 DIAGNOSIS — Z3A01 Less than 8 weeks gestation of pregnancy: Secondary | ICD-10-CM | POA: Insufficient documentation

## 2020-01-05 DIAGNOSIS — O219 Vomiting of pregnancy, unspecified: Secondary | ICD-10-CM | POA: Insufficient documentation

## 2020-01-05 DIAGNOSIS — K219 Gastro-esophageal reflux disease without esophagitis: Secondary | ICD-10-CM | POA: Insufficient documentation

## 2020-01-05 DIAGNOSIS — Z91040 Latex allergy status: Secondary | ICD-10-CM | POA: Insufficient documentation

## 2020-01-05 LAB — COMPREHENSIVE METABOLIC PANEL WITH GFR
ALT: 28 U/L (ref 0–44)
AST: 22 U/L (ref 15–41)
Albumin: 3.6 g/dL (ref 3.5–5.0)
Alkaline Phosphatase: 52 U/L (ref 38–126)
Anion gap: 10 (ref 5–15)
BUN: 7 mg/dL (ref 6–20)
CO2: 23 mmol/L (ref 22–32)
Calcium: 9.4 mg/dL (ref 8.9–10.3)
Chloride: 101 mmol/L (ref 98–111)
Creatinine, Ser: 0.46 mg/dL (ref 0.44–1.00)
GFR, Estimated: >60 mL/min
Glucose, Bld: 101 mg/dL — ABNORMAL HIGH (ref 70–99)
Potassium: 3.1 mmol/L — ABNORMAL LOW (ref 3.5–5.1)
Sodium: 134 mmol/L — ABNORMAL LOW (ref 135–145)
Total Bilirubin: 0.6 mg/dL (ref 0.3–1.2)
Total Protein: 8.1 g/dL (ref 6.5–8.1)

## 2020-01-05 LAB — LIPASE, BLOOD: Lipase: 28 U/L (ref 11–51)

## 2020-01-05 LAB — CBC
HCT: 33 % — ABNORMAL LOW (ref 36.0–46.0)
Hemoglobin: 9.4 g/dL — ABNORMAL LOW (ref 12.0–15.0)
MCH: 19.2 pg — ABNORMAL LOW (ref 26.0–34.0)
MCHC: 28.5 g/dL — ABNORMAL LOW (ref 30.0–36.0)
MCV: 67.3 fL — ABNORMAL LOW (ref 80.0–100.0)
Platelets: 336 K/uL (ref 150–400)
RBC: 4.9 MIL/uL (ref 3.87–5.11)
RDW: 23.2 % — ABNORMAL HIGH (ref 11.5–15.5)
WBC: 10.2 K/uL (ref 4.0–10.5)
nRBC: 0 % (ref 0.0–0.2)

## 2020-01-05 NOTE — ED Triage Notes (Signed)
Pt to er via ems, per ems pt was trying to get to [place], states that she is [redacted] weeks pregnant and having abd cramping, pt states that she is pregnant with some cramping. States that the cramping started when she was walking about 45 mins ago. Reports some discharge, yellow in color.

## 2020-01-06 ENCOUNTER — Emergency Department (HOSPITAL_COMMUNITY)
Admission: EM | Admit: 2020-01-06 | Discharge: 2020-01-06 | Disposition: A | Discharge status: Home / Self Care | Payer: Medicaid Other | Attending: Emergency Medicine | Admitting: Emergency Medicine

## 2020-01-06 DIAGNOSIS — N760 Acute vaginitis: Secondary | ICD-10-CM

## 2020-01-06 DIAGNOSIS — B9689 Other specified bacterial agents as the cause of diseases classified elsewhere: Secondary | ICD-10-CM

## 2020-01-06 DIAGNOSIS — Z3401 Encounter for supervision of normal first pregnancy, first trimester: Secondary | ICD-10-CM

## 2020-01-06 DIAGNOSIS — A599 Trichomoniasis, unspecified: Secondary | ICD-10-CM

## 2020-01-06 LAB — WET PREP, GENITAL
Sperm: NONE SEEN
Yeast Wet Prep HPF POC: NONE SEEN

## 2020-01-06 LAB — URINALYSIS, ROUTINE W REFLEX MICROSCOPIC
Bilirubin Urine: NEGATIVE
Glucose, UA: >=500 mg/dL — AB
Hgb urine dipstick: NEGATIVE
Ketones, ur: 5 mg/dL — AB
Nitrite: NEGATIVE
Protein, ur: 30 mg/dL — AB
RBC / HPF: >50 RBC/hpf — ABNORMAL HIGH (ref 0–5)
Specific Gravity, Urine: 1.027 (ref 1.005–1.030)
WBC, UA: >50 WBC/hpf — ABNORMAL HIGH (ref 0–5)
pH: 5 (ref 5.0–8.0)

## 2020-01-06 LAB — POC URINE PREG, ED: Preg Test, Ur: POSITIVE — AB

## 2020-01-06 MED ORDER — NITROFURANTOIN MONOHYD MACRO 100 MG PO CAPS: 100.0000 mg | ORAL_CAPSULE | Freq: Two times a day (BID) | ORAL | 0 refills | Status: DC

## 2020-01-06 MED ORDER — METRONIDAZOLE 500 MG PO TABS: 500.0000 mg | ORAL_TABLET | Freq: Two times a day (BID) | ORAL | 0 refills | Status: DC

## 2020-01-06 NOTE — ED Provider Notes (Signed)
[facility] EMERGENCY DEPARTMENT Provider Note   CSN: [id number] Arrival date & time: 01/05/20  1535   Time seen 2:29 AM  History Chief Complaint  Patient presents with  . Abdominal Pain    Amy Dougherty is a 24 y.o. female.  HPI   Patient is G5 P2 Ab2 (abortions) last normal period was the last part of July.  She states she had a ultrasound for this pregnancy in [facility] showing she was [redacted] weeks pregnant at the beginning of September.  She has not gotten any prenatal care.  She was seen in the ED on November 19 and treated for trichomonas and was given a referral to an OB that she has not followed through with.  She states she is taking prenatal vitamins.  She states she has been having lower abdominal cramping for a few days.  She states she has been spotting on January 02, 2019 seventh, and 28th.  She states she feels like her "pelvic bones are popping and cracking like I cannot keep the baby up inside me".  She describes dysuria and frequency and sometimes she sees blood when she wipes.  She states she has mild nausea now and the last time she had vomiting was several weeks ago.  Patient initially told me she had not had any sex since August however then she said she had sex with a new partner beginning November.  Patient states she lives in [place] and she came up to [place] today to take care of a traffic ticket from a few years ago.  She states she does not drive.  She states she took a "transport bus" here, she does not know how she is going to get home.  PCP Patient, No Pcp Per   Past Medical History:  Diagnosis Date  . Herpes genitalis in women    currently taking medications  . History of blood clots    Blood clot in  Right overy after delivery  . Wisdom teeth extracted     Patient Active Problem List   Diagnosis Date Noted  . Bipolar 1 disorder, depressed, severe ([place]) 11/05/2013  . Oral herpes simplex infection 05/22/2013  . Gonorrhea 05/22/2013  .  Candidiasis of perineum 05/22/2013  . Dermatofibroma of neck 05/22/2013  . History of eczema 05/22/2013  . Genital herpes 05/16/2013  . Chlamydia vaginitis/cervicitis 12/14/2012  . Molluscum contagiosum infection 11/29/2012  . GERD without esophagitis 10/10/2012  . Contraceptive management 06/25/2012  . Dysfunctional uterine bleeding 08/26/2011  . Normocytic anemia 08/26/2011  . History of venous thrombosis 03/09/2010    Past Surgical History:  Procedure Laterality Date  . WISDOM TOOTH EXTRACTION       OB History    Gravida  4   Para  2   Term  2   Preterm  0   AB  1   Living  2     SAB  1   TAB  0   Ectopic  0   Multiple  0   Live Births  2           Family History  Problem Relation Age of Onset  . Asthma Brother     Social History   Tobacco Use  . Smoking status: Never Smoker  . Smokeless tobacco: Never Used  Vaping Use  . Vaping Use: Never used  Substance Use Topics  . Alcohol use: Not Currently    Alcohol/week: 0.0 standard drinks  . Drug use: No  unemployed  Home Medications Prior to Admission medications   Medication Sig Start Date End Date Taking? Authorizing Provider  acetaminophen (TYLENOL) 500 MG tablet Take 500 mg by mouth every 6 (six) hours as needed for mild pain or headache.     [provider]  azithromycin (ZITHROMAX) 500 MG tablet Take two tablets once Patient not taking: Reported on 06/29/2019 07/27/17   Shelly Bombard, MD  calcium carbonate (OS-CAL) 1250 (500 Ca) MG chewable tablet Chew 1 tablet by mouth daily as needed for heartburn.     [provider]  clindamycin (CLEOCIN) 300 MG capsule Take 1 capsule (300 mg total) by mouth 3 (three) times daily. Patient not taking: Reported on 06/29/2019 06/16/17   Shelly Bombard, MD  diphenhydrAMINE HCl (BENADRYL ALLERGY PO) Take 1 tablet by mouth daily.    [provider]  drospirenone-ethinyl estradiol (YAZ) 3-0.02 MG tablet Take 1 tablet by mouth  daily. Patient not taking: Reported on 06/29/2019 06/14/17   Shelly Bombard, MD  hydrocortisone 2.5 % lotion Apply topically 2 (two) times daily. Apply to affected area/rash. Avoid use on face. Patient not taking: Reported on 06/29/2019 06/20/18   Antonietta Breach, PA-C  metroNIDAZOLE (FLAGYL) 500 MG tablet Take 1 tablet (500 mg total) by mouth 2 (two) times daily. 01/06/20   Rolland Porter, MD  nitrofurantoin, macrocrystal-monohydrate, (MACROBID) 100 MG capsule Take 1 capsule (100 mg total) by mouth 2 (two) times daily. 01/06/20   Rolland Porter, MD  ondansetron (ZOFRAN ODT) 4 MG disintegrating tablet Take 1 tablet (4 mg total) by mouth every 8 (eight) hours as needed for nausea or vomiting. 06/29/19   Suzy Bouchard, PA-C  cetirizine (ZYRTEC) 10 MG tablet Take 10 mg by mouth daily as needed. For cold symptoms  04/25/11  [provider]    Allergies    Flagyl [metronidazole], Apple, Cherry, Ibuprofen, Latex, Norethindrone, and Other  Review of Systems   Review of Systems  All other systems reviewed and are negative.   Physical Exam Updated Vital Signs BP 135/74 (BP Location: Right Arm)   Pulse 95   Temp 98 F (36.7 C) (Oral)   Resp (!) 23   Ht 5\' 7"  (1.702 m)   Wt 90.7 kg   LMP 09/03/2019   SpO2 100%   BMI 31.32 kg/m   Physical Exam Constitutional:      General: She is not in acute distress.    Appearance: Normal appearance. She is obese.  HENT:     Head: Normocephalic and atraumatic.  Eyes:     Extraocular Movements: Extraocular movements intact.     Conjunctiva/sclera: Conjunctivae normal.  Cardiovascular:     Rate and Rhythm: Normal rate and regular rhythm.     Pulses: Normal pulses.     Heart sounds: Normal heart sounds.  Pulmonary:     Effort: Pulmonary effort is normal.  Abdominal:     Palpations: Abdomen is soft.     Tenderness: There is abdominal tenderness.  Genitourinary:    General: Normal vulva.     Vagina: Vaginal discharge present.     Comments:  Patient has a large amount of white/yellow discharge.  She seems very uncomfortable even just with the speculum, when I did the bimanual I was unable to assess uterine size due to her tensing up her abdominal muscles.  She did not appear to be tender over the adnexa. Fetal heart tones were 147. Musculoskeletal:        General: Normal range of  motion.     Cervical back: Normal range of motion.  Skin:    General: Skin is warm and dry.  Neurological:     General: No focal deficit present.     Mental Status: She is alert and oriented to person, place, and time.     Cranial Nerves: No cranial nerve deficit.  Psychiatric:        Mood and Affect: Mood normal.        Behavior: Behavior normal.        Thought Content: Thought content normal.     ED Results / Procedures / Treatments   Labs (all labs ordered are listed, but only abnormal results are displayed) Results for orders placed or performed during the hospital encounter of 01/06/20  Wet prep, genital   Specimen: PATH Cytology Cervicovaginal Ancillary Only  Result Value Ref Range   Yeast Wet Prep HPF POC NONE SEEN NONE SEEN   Trich, Wet Prep PRESENT (A) NONE SEEN   Clue Cells Wet Prep HPF POC PRESENT (A) NONE SEEN   WBC, Wet Prep HPF POC MANY (A) NONE SEEN   Sperm NONE SEEN   Lipase, blood  Result Value Ref Range   Lipase 28 11 - 51 U/L  Comprehensive metabolic panel  Result Value Ref Range   Sodium 134 (L) 135 - 145 mmol/L   Potassium 3.1 (L) 3.5 - 5.1 mmol/L   Chloride 101 98 - 111 mmol/L   CO2 23 22 - 32 mmol/L   Glucose, Bld 101 (H) 70 - 99 mg/dL   BUN 7 6 - 20 mg/dL   Creatinine, Ser 0.46 0.44 - 1.00 mg/dL   Calcium 9.4 8.9 - 10.3 mg/dL   Total Protein 8.1 6.5 - 8.1 g/dL   Albumin 3.6 3.5 - 5.0 g/dL   AST 22 15 - 41 U/L   ALT 28 0 - 44 U/L   Alkaline Phosphatase 52 38 - 126 U/L   Total Bilirubin 0.6 0.3 - 1.2 mg/dL   GFR, Estimated >60 >60 mL/min   Anion gap 10 5 - 15  CBC  Result Value Ref Range   WBC 10.2 4.0 -  10.5 K/uL   RBC 4.90 3.87 - 5.11 MIL/uL   Hemoglobin 9.4 (L) 12.0 - 15.0 g/dL   HCT 33.0 (L) 36 - 46 %   MCV 67.3 (L) 80.0 - 100.0 fL   MCH 19.2 (L) 26.0 - 34.0 pg   MCHC 28.5 (L) 30.0 - 36.0 g/dL   RDW 23.2 (H) 11.5 - 15.5 %   Platelets 336 150 - 400 K/uL   nRBC 0.0 0.0 - 0.2 %  Urinalysis, Routine w reflex microscopic  Result Value Ref Range   Color, Urine YELLOW YELLOW   APPearance CLOUDY (A) CLEAR   Specific Gravity, Urine 1.027 1.005 - 1.030   pH 5.0 5.0 - 8.0   Glucose, UA >=500 (A) NEGATIVE mg/dL   Hgb urine dipstick NEGATIVE NEGATIVE   Bilirubin Urine NEGATIVE NEGATIVE   Ketones, ur 5 (A) NEGATIVE mg/dL   Protein, ur 30 (A) NEGATIVE mg/dL   Nitrite NEGATIVE NEGATIVE   Leukocytes,Ua LARGE (A) NEGATIVE   RBC / HPF >50 (H) 0 - 5 RBC/hpf   WBC, UA >50 (H) 0 - 5 WBC/hpf   Bacteria, UA RARE (A) NONE SEEN   Squamous Epithelial / LPF 6-10 0 - 5   Mucus PRESENT    Ca Oxalate Crys, UA PRESENT   POC urine preg, ED  Result Value Ref Range  Preg Test, Ur POSITIVE (A) NEGATIVE   Laboratory interpretation all normal except contaminated urinalysis, trichomoniasis and bacterial vaginosis    EKG None  Radiology No results found.  Procedures Procedures (including critical care time)  Medications Ordered in ED Medications - No data to display  ED Course  I have reviewed the triage vital signs and the nursing notes.  Pertinent labs & imaging results that were available during my care of the patient were reviewed by me and considered in my medical decision making (see chart for details).    MDM Rules/Calculators/A&P                         Patient was evaluated in the emergency department at [facility] on the 19th and was prescribed Flagyl for trichomoniasis.  When I asked the patient about it she states she never got it filled.  I stressed the importance of taking her medication, rather than coming back to the emergency department for the same problem.  She needs to  follow-up with her OB and start her prenatal care.  Reviewing that chart shows she actually did not have an allergy to metronidazole.    Final Clinical Impression(s) / ED Diagnoses Final diagnoses:  BV (bacterial vaginosis)  Trichomoniasis  Pregnancy, first, first trimester    Rx / DC Orders ED Discharge Orders         Ordered    Consult to obstetrics / gynecology  Status:  Canceled       Provider:  (Not yet assigned)   01/06/20 0407    metroNIDAZOLE (FLAGYL) 500 MG tablet  2 times daily        01/06/20 0409    nitrofurantoin, macrocrystal-monohydrate, (MACROBID) 100 MG capsule  2 times daily        01/06/20 0414         Plan discharge  Rolland Porter, MD, Barbette Or, MD 01/06/20 [phone]

## 2020-01-06 NOTE — Discharge Instructions (Addendum)
Your gonorrhea and Chlamydia tests will not be back tonight.  You do have trichomoniasis and bacterial vaginosis, they are both treated with Flagyl which she just finished.  You need to have your sexual partners also get treated.  You need to continue your prenatal vitamins and follow through with getting your follow-up care with the obstetricians.

## 2020-01-07 LAB — URINE CULTURE: Culture: <10000 — AB

## 2020-01-07 LAB — GC/CHLAMYDIA PROBE AMP (~~LOC~~) NOT AT ARMC
Chlamydia: NEGATIVE
Comment: NEGATIVE
Comment: NORMAL
Neisseria Gonorrhea: NEGATIVE

## 2020-02-13 ENCOUNTER — Emergency Department (HOSPITAL_COMMUNITY)
Admission: EM | Admit: 2020-02-13 | Discharge: 2020-02-13 | Disposition: A | Discharge status: Home / Self Care | Payer: Medicaid Other | Attending: Emergency Medicine | Admitting: Emergency Medicine

## 2020-02-13 ENCOUNTER — Encounter (HOSPITAL_COMMUNITY): Payer: Self-pay | Admitting: *Deleted

## 2020-02-13 DIAGNOSIS — O26891 Other specified pregnancy related conditions, first trimester: Secondary | ICD-10-CM | POA: Insufficient documentation

## 2020-02-13 DIAGNOSIS — Z91040 Latex allergy status: Secondary | ICD-10-CM | POA: Insufficient documentation

## 2020-02-13 DIAGNOSIS — Z711 Person with feared health complaint in whom no diagnosis is made: Secondary | ICD-10-CM

## 2020-02-13 DIAGNOSIS — Z760 Encounter for issue of repeat prescription: Secondary | ICD-10-CM | POA: Insufficient documentation

## 2020-02-13 DIAGNOSIS — Z3A00 Weeks of gestation of pregnancy not specified: Secondary | ICD-10-CM | POA: Insufficient documentation

## 2020-02-13 LAB — URINALYSIS, ROUTINE W REFLEX MICROSCOPIC
Bilirubin Urine: NEGATIVE
Glucose, UA: NEGATIVE mg/dL
Hgb urine dipstick: NEGATIVE
Ketones, ur: 5 mg/dL — AB
Nitrite: NEGATIVE
Protein, ur: NEGATIVE mg/dL
Specific Gravity, Urine: 1.016 (ref 1.005–1.030)
pH: 7 (ref 5.0–8.0)

## 2020-02-13 MED ORDER — CEFTRIAXONE SODIUM 1 G IJ SOLR
500.0000 mg | Freq: Once | INTRAMUSCULAR | Status: AC
  Administered 2020-02-13: 500 mg via INTRAMUSCULAR
  Filled 2020-02-13: qty 10

## 2020-02-13 MED ORDER — AZITHROMYCIN 250 MG PO TABS
1000.0000 mg | ORAL_TABLET | Freq: Once | ORAL | Status: AC
  Administered 2020-02-13: 1000 mg via ORAL
  Filled 2020-02-13: qty 4

## 2020-02-13 NOTE — Discharge Instructions (Signed)
Contact you with the results of her gonorrhea and committee testing as well as your urine culture. Follow-up with your primary care provider and OB/GYN. Return to the ER if you start to experience worsening pain, discharge, abdominal pain, abnormal vaginal bleeding.

## 2020-02-13 NOTE — ED Triage Notes (Addendum)
Pt would like prescriptions for valtrex, flagyl for her herpes and BV. She took part of her original prescription but stoppep.

## 2020-02-13 NOTE — ED Provider Notes (Signed)
[facility] DEPT Provider Note   CSN: [id number] Arrival date & time: 02/13/20  1414     History Chief Complaint  Patient presents with  . Medication Refill    Amy Dougherty is a 25 y.o. female who is currently pregnant at unknown gestation (states that her last period was end of July and had an ultrasound in the beginning of December which shows that she was at [redacted] weeks gestation) presenting to the ED with a chief complaint of medication refill.  States that back in November she tested positive for trichomonas but unfortunately did not complete her course of Flagyl.  She came back to the ER at the end of November and wanted a refill.  She states that she has established care with an OB/GYN here.  She is requesting a refill of her Flagyl as she never completed it and wants a prescription for Valtrex as she states that "the Flagyl made my herpes flareup."  She is also requesting gonorrhea and Chlamydia testing and treatment.  She states that she has 2 active lesions of herpes right now.  States that she does still have discharge.  Denies any abdominal pain, vaginal bleeding  HPI     Past Medical History:  Diagnosis Date  . Herpes genitalis in women    currently taking medications  . History of blood clots    Blood clot in  Right overy after delivery  . Wisdom teeth extracted     Patient Active Problem List   Diagnosis Date Noted  . Bipolar 1 disorder, depressed, severe ([place]) 11/05/2013  . Oral herpes simplex infection 05/22/2013  . Gonorrhea 05/22/2013  . Candidiasis of perineum 05/22/2013  . Dermatofibroma of neck 05/22/2013  . History of eczema 05/22/2013  . Genital herpes 05/16/2013  . Chlamydia vaginitis/cervicitis 12/14/2012  . Molluscum contagiosum infection 11/29/2012  . GERD without esophagitis 10/10/2012  . Contraceptive management 06/25/2012  . Dysfunctional uterine bleeding 08/26/2011  . Normocytic anemia 08/26/2011  . History of  venous thrombosis 03/09/2010    Past Surgical History:  Procedure Laterality Date  . WISDOM TOOTH EXTRACTION       OB History    Gravida  4   Para  2   Term  2   Preterm  0   AB  1   Living  2     SAB  1   IAB  0   Ectopic  0   Multiple  0   Live Births  2           Family History  Problem Relation Age of Onset  . Asthma Brother     Social History   Tobacco Use  . Smoking status: Never Smoker  . Smokeless tobacco: Never Used  Vaping Use  . Vaping Use: Never used  Substance Use Topics  . Alcohol use: Not Currently    Alcohol/week: 0.0 standard drinks  . Drug use: No    Home Medications Prior to Admission medications   Medication Sig Start Date End Date Taking? Authorizing Provider  acetaminophen (TYLENOL) 500 MG tablet Take 500 mg by mouth every 6 (six) hours as needed for mild pain or headache.     [provider]  azithromycin (ZITHROMAX) 500 MG tablet Take two tablets once Patient not taking: Reported on 06/29/2019 07/27/17   Shelly Bombard, MD  calcium carbonate (OS-CAL) 1250 (500 Ca) MG chewable tablet Chew 1 tablet by mouth daily as needed for heartburn.  [provider]  clindamycin (CLEOCIN) 300 MG capsule Take 1 capsule (300 mg total) by mouth 3 (three) times daily. Patient not taking: Reported on 06/29/2019 06/16/17   Shelly Bombard, MD  diphenhydrAMINE HCl (BENADRYL ALLERGY PO) Take 1 tablet by mouth daily.    [provider]  drospirenone-ethinyl estradiol (YAZ) 3-0.02 MG tablet Take 1 tablet by mouth daily. Patient not taking: Reported on 06/29/2019 06/14/17   Shelly Bombard, MD  hydrocortisone 2.5 % lotion Apply topically 2 (two) times daily. Apply to affected area/rash. Avoid use on face. Patient not taking: Reported on 06/29/2019 06/20/18   Antonietta Breach, PA-C  metroNIDAZOLE (FLAGYL) 500 MG tablet Take 1 tablet (500 mg total) by mouth 2 (two) times daily. 01/06/20   Rolland Porter, MD  nitrofurantoin,  macrocrystal-monohydrate, (MACROBID) 100 MG capsule Take 1 capsule (100 mg total) by mouth 2 (two) times daily. 01/06/20   Rolland Porter, MD  ondansetron (ZOFRAN ODT) 4 MG disintegrating tablet Take 1 tablet (4 mg total) by mouth every 8 (eight) hours as needed for nausea or vomiting. 06/29/19   Suzy Bouchard, PA-C  cetirizine (ZYRTEC) 10 MG tablet Take 10 mg by mouth daily as needed. For cold symptoms  04/25/11  [provider]    Allergies    Flagyl [metronidazole], Apple, Cherry, Ibuprofen, Latex, Norethindrone, and Other  Review of Systems   Review of Systems  Constitutional: Negative for chills and fever.  Gastrointestinal: Negative for abdominal distention.  Genitourinary: Positive for vaginal discharge. Negative for vaginal pain.  Skin: Positive for rash.  Neurological: Negative for weakness and numbness.    Physical Exam Updated Vital Signs BP (!) 127/97 (BP Location: Left Arm)   Pulse (!) 103   Temp 98.8 F (37.1 C) (Oral)   Resp 18   LMP 09/03/2019   SpO2 100%   Physical Exam Vitals and nursing note reviewed.  Constitutional:      General: She is not in acute distress.    Appearance: She is well-developed and well-nourished.  HENT:     Head: Normocephalic and atraumatic.     Nose: Nose normal.  Eyes:     General: No scleral icterus.       Left eye: No discharge.     Extraocular Movements: EOM normal.     Conjunctiva/sclera: Conjunctivae normal.  Cardiovascular:     Rate and Rhythm: Normal rate and regular rhythm.     Pulses: Intact distal pulses.     Heart sounds: Normal heart sounds. No murmur heard. No friction rub. No gallop.   Pulmonary:     Effort: Pulmonary effort is normal. No respiratory distress.     Breath sounds: Normal breath sounds.  Abdominal:     General: Bowel sounds are normal. There is no distension.     Palpations: Abdomen is soft.     Tenderness: There is no abdominal tenderness. There is no guarding.  Musculoskeletal:         General: No edema. Normal range of motion.     Cervical back: Normal range of motion and neck supple.  Skin:    General: Skin is warm and dry.     Findings: No rash.  Neurological:     Mental Status: She is alert.     Motor: No abnormal muscle tone.     Coordination: Coordination normal.  Psychiatric:        Mood and Affect: Mood and affect normal.     ED Results / Procedures / Treatments  Labs (all labs ordered are listed, but only abnormal results are displayed) Labs Reviewed  URINE CULTURE  URINALYSIS, ROUTINE W REFLEX MICROSCOPIC  GC/CHLAMYDIA PROBE AMP ([place]) NOT AT [facility]    EKG None  Radiology No results found.  Procedures Procedures (including critical care time)  Medications Ordered in ED Medications  cefTRIAXone (ROCEPHIN) injection 500 mg (500 mg Intramuscular Given 02/13/20 1718)  azithromycin (ZITHROMAX) tablet 1,000 mg (1,000 mg Oral Given 02/13/20 1717)    ED Course  I have reviewed the triage vital signs and the nursing notes.  Pertinent labs & imaging results that were available during my care of the patient were reviewed by me and considered in my medical decision making (see chart for details).    MDM Rules/Calculators/A&P                          25 year old female presenting to the ED for refill of the Flagyl that she never completed in November.  She is also concerned that the Flagyl flared up her herpes and would like a prescription for Valtrex as well as being tested and treated for gonorrhea and chlamydia.  She tested positive for BV in November.  Patient states that she does possibly have 2 active herpes lesions in her genitalia right now.  Informed patient that I will need to complete a pelvic exam and visually examine her in order to obtain repeat testing for BV as well as gonorrhea and chlamydia.  She states that "I know I still have BV because I never completed the medicine."  Explained to her that we still need to retest her as this is  not a medication that typically requires a refill.  States that she needs to be at work in about 30 minutes and does not want a pelvic exam done.  She states that she will follow up with her OB/GYN about this but does asked that we test her for gonorrhea chlamydia and treat accordingly.  I will send a urine culture as well and will use urine to test for GC chlamydia.  She denies any pregnancy related complaints at this time.  Return precautions given. Patient eloped prior to urinalysis resulting. She will be contacted if her urine culture is positive and be treated accordingly.   Portions of this note were generated with [employer]. Dictation errors may occur despite best attempts at proofreading.  Final Clinical Impression(s) / ED Diagnoses Final diagnoses:  Concern about STD in female without diagnosis    Rx / DC Orders ED Discharge Orders    None       Delia Heady, PA-C 02/13/20 1833    Carmin Muskrat, MD 02/14/20 1358

## 2020-02-15 LAB — URINE CULTURE

## 2020-02-16 LAB — GC/CHLAMYDIA PROBE AMP (~~LOC~~) NOT AT ARMC
Chlamydia: NEGATIVE
Comment: NEGATIVE
Comment: NORMAL
Neisseria Gonorrhea: NEGATIVE

## 2020-03-08 ENCOUNTER — Telehealth: Payer: Self-pay | Admitting: General Practice

## 2020-03-08 NOTE — Telephone Encounter (Signed)
Rcvd faxed frm [place], [place] (req'd med  recs frm [place] but used [facility] fax number incorrectly--shredding information.  -glh

## 2020-05-07 DIAGNOSIS — F1990 Other psychoactive substance use, unspecified, uncomplicated: Secondary | ICD-10-CM | POA: Insufficient documentation

## 2020-05-17 DIAGNOSIS — F29 Unspecified psychosis not due to a substance or known physiological condition: Secondary | ICD-10-CM | POA: Insufficient documentation

## 2020-05-19 DIAGNOSIS — I10 Essential (primary) hypertension: Secondary | ICD-10-CM | POA: Insufficient documentation

## 2020-06-26 ENCOUNTER — Encounter (HOSPITAL_COMMUNITY): Payer: Self-pay

## 2020-06-26 ENCOUNTER — Emergency Department (HOSPITAL_COMMUNITY)
Admission: EM | Admit: 2020-06-26 | Discharge: 2020-06-26 | Disposition: A | Discharge status: Home / Self Care | Payer: Medicaid Other | Attending: Emergency Medicine | Admitting: Emergency Medicine

## 2020-06-26 ENCOUNTER — Ambulatory Visit (HOSPITAL_COMMUNITY)
Admission: RE | Admit: 2020-06-26 | Discharge: 2020-06-26 | Disposition: A | Discharge status: Home / Self Care | Payer: Medicaid Other | Attending: Psychiatry | Admitting: Psychiatry

## 2020-06-26 DIAGNOSIS — Z91040 Latex allergy status: Secondary | ICD-10-CM | POA: Insufficient documentation

## 2020-06-26 DIAGNOSIS — Z1330 Encounter for screening examination for mental health and behavioral disorders, unspecified: Secondary | ICD-10-CM | POA: Insufficient documentation

## 2020-06-26 DIAGNOSIS — Z20822 Contact with and (suspected) exposure to covid-19: Secondary | ICD-10-CM | POA: Insufficient documentation

## 2020-06-26 DIAGNOSIS — Z202 Contact with and (suspected) exposure to infections with a predominantly sexual mode of transmission: Secondary | ICD-10-CM

## 2020-06-26 LAB — WET PREP, GENITAL
Sperm: NONE SEEN
Trich, Wet Prep: NONE SEEN
Yeast Wet Prep HPF POC: NONE SEEN

## 2020-06-26 MED ORDER — CEFTRIAXONE SODIUM 1 G IJ SOLR
500.0000 mg | Freq: Once | INTRAMUSCULAR | Status: AC
  Administered 2020-06-26: 500 mg via INTRAMUSCULAR
  Filled 2020-06-26: qty 10

## 2020-06-26 MED ORDER — METRONIDAZOLE 500 MG PO TABS
500.0000 mg | ORAL_TABLET | Freq: Once | ORAL | Status: AC
  Administered 2020-06-26: 500 mg via ORAL
  Filled 2020-06-26: qty 1

## 2020-06-26 MED ORDER — LIDOCAINE HCL 1 % IJ SOLN
INTRAMUSCULAR | Status: AC
  Filled 2020-06-26: qty 20

## 2020-06-26 MED ORDER — METRONIDAZOLE 500 MG PO TABS: 500.0000 mg | ORAL_TABLET | Freq: Two times a day (BID) | ORAL | 0 refills | Status: DC

## 2020-06-26 MED ORDER — DOXYCYCLINE HYCLATE 100 MG PO TABS
100.0000 mg | ORAL_TABLET | Freq: Once | ORAL | Status: AC
  Administered 2020-06-26: 100 mg via ORAL
  Filled 2020-06-26: qty 1

## 2020-06-26 MED ORDER — DOXYCYCLINE HYCLATE 100 MG PO CAPS: 100.0000 mg | ORAL_CAPSULE | Freq: Two times a day (BID) | ORAL | 0 refills | Status: DC

## 2020-06-26 MED ORDER — CARVEDILOL 6.25 MG PO TABS: 6.2500 mg | ORAL_TABLET | Freq: Two times a day (BID) | ORAL | 0 refills | Status: DC

## 2020-06-26 NOTE — BH Assessment (Signed)
Comprehensive Clinical Assessment (CCA) Screening, Triage and Referral Note  06/26/2020 Amy Dougherty [id number]   Disposition: Amy Ala, NP, patient is psych-cleared and referred to [facility] for follow up treatment   Chief Complaint: Amy Dougherty is a 25 y.o. female presents to call behavioral health after recent discharge from [facility] due to reported issues with her" blood pressure."  Amy Dougherty is requesting an evaluation for postpartum depression.  Reports" I do not feel depressed" I just need an evaluation for custody court.  She denied suicidal or homicidal ideations.  Denies auditory visual hallucinations.  Denies irritability, poor concentration, psychosis, social isolation or any other depressive symptoms.  Denied previous inpatient admissions. patient denied that she was diagnosed with depression with her other children. Reports she recently moved here from [place] so she is looking for primary care/OB/GYN.  Amy Dougherty is not very forthright during this assessment.  Stated " I was placed on magnesium and I declined to take this medication and this  started the whole situation."  NP attempted to obtain additional collateral requested to speak to patient's mother who she reports has custody of all 3 of her children.  She declined.  Follow-up [facility] during open access and or primary care for postpartum follow-up.  Support, encouragement and  reassurance was provided.   Chief Complaint  Patient presents with  . Psychiatric Evaluation   Visit Diagnosis: Depression self-report  Patient Reported Information How did you hear about [place]? Self   Referral name: No data recorded  Referral phone number: No data recorded Whom do you see for routine medical problems? No data recorded  Practice/Facility Name: No data recorded  Practice/Facility Phone Number: No data recorded  Name of Contact: No data recorded  Contact Number: No data recorded  Contact Fax Number: No data recorded  Prescriber  Name: No data recorded  Prescriber Address (if known): No data recorded What Is the Reason for Your Visit/Call Today? Amy Dougherty is a 25 y.o. female presents to call behavioral health after recent discharge from [facility] due to reported issues with her" blood pressure."  Amy Dougherty is requesting an evaluation for postpartum depression.  Reports" I do not feel depressed" I just need an evaluation for custody court.  She denied suicidal or homicidal ideations.  Denies auditory visual hallucinations.  Denies irritability, poor concentration, psychosis, social isolation or any other depressive symptoms.  Denied previous inpatient admissions. patient denied that she was diagnosed with depression with her other children. Reports she recently moved here from [place] so she is looking for primary care/OB/GYN.     Amy Dougherty is not very forthright during this assessment.  Stated " I was placed on magnesium and I declined to take this medication and this  started the whole situation."  NP attempted to obtain additional collateral requested to speak to patient's mother who she reports has custody of all 3 of her children.  She declined.  Follow-up Amy Dougherty during open access and or primary care for postpartum follow-up.  Support, encouragement and  reassurance was provided.  How Long Has This Been Causing You Problems? <Week  Have You Recently Been in Any Inpatient Treatment (Hospital/Detox/Crisis Center/28-Day Program)? No   Name/Location of Program/Hospital:No data recorded  How Long Were You There? No data recorded  When Were You Discharged? No data recorded Have You Ever Received Services From [facility] Before? No   Who Do You See at [facility]? No data recorded Have You Recently Had Any Thoughts About Hurting Yourself? No  Are You Planning to Commit Suicide/Harm Yourself At This time?  No  Have you Recently Had Thoughts About [place]? No   Explanation: No data recorded Have You Used  Any Alcohol or Drugs in the Past 24 Hours? No   How Long Ago Did You Use Drugs or Alcohol?  No data recorded  What Did You Use and How Much? No data recorded What Do You Feel Would Help You the Most Today? Stress Management  Do You Currently Have a Therapist/Psychiatrist? No   Name of Therapist/Psychiatrist: No data recorded  Have You Been Recently Discharged From Any Office Practice or Programs? No   Explanation of Discharge From Practice/Program:  No data recorded    CCA Screening Triage Referral Assessment Type of Contact: Face-to-Face   Is this Initial or Reassessment? Initial Assessment   Date Telepsych consult ordered in [facility]:  06/26/2020   Time Telepsych consult ordered in [facility]:  1550  Patient Reported Information Reviewed? Yes   Patient Left Without Being Seen? No data recorded  Reason for Not Completing Assessment: No data recorded Collateral Involvement: n/a  Does Patient Have a Court Appointed Legal Guardian? No data recorded  Name and Contact of Legal Guardian:  No data recorded If Minor and Not Living with Parent(s), Who has Custody? No data recorded Is CPS involved or ever been involved? Never  Is APS involved or ever been involved? Never  Patient Determined To Be At Risk for Harm To Self or Others Based on Review of Patient Reported Information or Presenting Complaint? No   Method: No data recorded  Availability of Means: No data recorded  Intent: No data recorded  Notification Required: No data recorded  Additional Information for Danger to Others Potential:  No data recorded  Additional Comments for Danger to Others Potential:  No data recorded  Are There Guns or Other Weapons in Your Home?  No data recorded   Types of Guns/Weapons: No data recorded   Are These Weapons Safely Secured?                              No data recorded   Who Could Verify You Are Able To Have These Secured:    No data recorded Do You Have any Outstanding Charges, Pending Court  Dates, Parole/Probation? No data recorded Contacted To Inform of Risk of Harm To Self or Others: No data recorded Location of Assessment: [facility]  Does Patient Present under Involuntary Commitment? No   IVC Papers Initial File Date: No data recorded  [place] of Residence: [place]  Patient Currently Receiving the Following Services: Individual Therapy   Determination of Need: Routine (7 days)   Options For Referral: Outpatient Therapy   Javonte Elenes, LCAS

## 2020-06-26 NOTE — ED Provider Notes (Signed)
[facility] DEPT Provider Note   CSN: [id number] Arrival date & time: 06/26/20  1255     History Chief Complaint  Patient presents with  . Exposure to STD    Amy Dougherty is a 25 y.o. female.  HPI Patient states she was exposed to gonorrhea, chlamydia and trichomonas through her sexual partner and discovered this 2 days ago.  She also is concerned about odor from her vagina, and a yeast infection on her perineum.  She denies fever, chills, nausea or vomiting.  She denies abdominal pain.  She states she has run out of her carvedilol and would like a refill on that prescription.  She denies headache, shortness of breath, chest pain or dizziness.  There are no other known active modifying factors    Past Medical History:  Diagnosis Date  . Herpes genitalis in women    currently taking medications  . History of blood clots    Blood clot in  Right overy after delivery  . Wisdom teeth extracted     Patient Active Problem List   Diagnosis Date Noted  . Bipolar 1 disorder, depressed, severe ([place]) 11/05/2013  . Oral herpes simplex infection 05/22/2013  . Gonorrhea 05/22/2013  . Candidiasis of perineum 05/22/2013  . Dermatofibroma of neck 05/22/2013  . History of eczema 05/22/2013  . Genital herpes 05/16/2013  . Chlamydia vaginitis/cervicitis 12/14/2012  . Molluscum contagiosum infection 11/29/2012  . GERD without esophagitis 10/10/2012  . Contraceptive management 06/25/2012  . Dysfunctional uterine bleeding 08/26/2011  . Normocytic anemia 08/26/2011  . History of venous thrombosis 03/09/2010    Past Surgical History:  Procedure Laterality Date  . WISDOM TOOTH EXTRACTION       OB History    Gravida  4   Para  2   Term  2   Preterm  0   AB  1   Living  2     SAB  1   IAB  0   Ectopic  0   Multiple  0   Live Births  2           Family History  Problem Relation Age of Onset  . Asthma Brother     Social History    Tobacco Use  . Smoking status: Never Smoker  . Smokeless tobacco: Never Used  Vaping Use  . Vaping Use: Never used  Substance Use Topics  . Alcohol use: Not Currently    Alcohol/week: 0.0 standard drinks  . Drug use: No    Home Medications Prior to Admission medications   Medication Sig Start Date End Date Taking? Authorizing Provider  acetaminophen (TYLENOL) 500 MG tablet Take 500 mg by mouth every 6 (six) hours as needed for mild pain or headache.     [provider]  azithromycin (ZITHROMAX) 500 MG tablet Take two tablets once Patient not taking: Reported on 06/29/2019 07/27/17   Shelly Bombard, MD  calcium carbonate (OS-CAL) 1250 (500 Ca) MG chewable tablet Chew 1 tablet by mouth daily as needed for heartburn.     [provider]  clindamycin (CLEOCIN) 300 MG capsule Take 1 capsule (300 mg total) by mouth 3 (three) times daily. Patient not taking: Reported on 06/29/2019 06/16/17   Shelly Bombard, MD  diphenhydrAMINE HCl (BENADRYL ALLERGY PO) Take 1 tablet by mouth daily.    [provider]  drospirenone-ethinyl estradiol (YAZ) 3-0.02 MG tablet Take 1 tablet by mouth daily. Patient not taking: Reported on 06/29/2019 06/14/17  Shelly Bombard, MD  hydrocortisone 2.5 % lotion Apply topically 2 (two) times daily. Apply to affected area/rash. Avoid use on face. Patient not taking: Reported on 06/29/2019 06/20/18   Antonietta Breach, PA-C  metroNIDAZOLE (FLAGYL) 500 MG tablet Take 1 tablet (500 mg total) by mouth 2 (two) times daily. 01/06/20   Rolland Porter, MD  nitrofurantoin, macrocrystal-monohydrate, (MACROBID) 100 MG capsule Take 1 capsule (100 mg total) by mouth 2 (two) times daily. 01/06/20   Rolland Porter, MD  ondansetron (ZOFRAN ODT) 4 MG disintegrating tablet Take 1 tablet (4 mg total) by mouth every 8 (eight) hours as needed for nausea or vomiting. 06/29/19   Suzy Bouchard, PA-C  cetirizine (ZYRTEC) 10 MG tablet Take 10 mg by mouth daily as needed. For  cold symptoms  04/25/11  [provider]    Allergies    Flagyl [metronidazole], Apple, Cherry, Ibuprofen, Latex, Norethindrone, and Other  Review of Systems   Review of Systems  All other systems reviewed and are negative.   Physical Exam Updated Vital Signs BP (!) 166/110 (BP Location: Left Arm)   Pulse 100   Temp 98.1 F (36.7 C) (Oral)   Resp 16   Ht 5\' 7"  (1.702 m)   Wt 99.8 kg   LMP 06/12/2020   SpO2 99%   Breastfeeding Unknown   BMI 34.46 kg/m   Physical Exam Vitals and nursing note reviewed.  Constitutional:      Appearance: She is well-developed.  HENT:     Head: Normocephalic and atraumatic.     Right Ear: External ear normal.     Left Ear: External ear normal.  Eyes:     Conjunctiva/sclera: Conjunctivae normal.     Pupils: Pupils are equal, round, and reactive to light.  Neck:     Trachea: Phonation normal.  Cardiovascular:     Rate and Rhythm: Normal rate.  Pulmonary:     Effort: Pulmonary effort is normal.  Abdominal:     General: There is no distension.  Musculoskeletal:        General: Normal range of motion.     Cervical back: Normal range of motion and neck supple.  Skin:    General: Skin is warm and dry.  Neurological:     Mental Status: She is alert and oriented to person, place, and time.     Cranial Nerves: No cranial nerve deficit.     Sensory: No sensory deficit.     Motor: No abnormal muscle tone.     Coordination: Coordination normal.  Psychiatric:        Mood and Affect: Mood normal.        Behavior: Behavior normal.        Thought Content: Thought content normal.        Judgment: Judgment normal.     ED Results / Procedures / Treatments   Labs (all labs ordered are listed, but only abnormal results are displayed) Labs Reviewed - No data to display  EKG None  Radiology No results found.  Procedures Procedures   Medications Ordered in ED Medications - No data to display  ED Course  I have reviewed the  triage vital signs and the nursing notes.  Pertinent labs & imaging results that were available during my care of the patient were reviewed by me and considered in my medical decision making (see chart for details).    MDM Rules/Calculators/A&P  Patient Vitals for the past 24 hrs:  BP Temp Temp src Pulse Resp SpO2 Height Weight  06/26/20 1312 -- -- -- -- -- -- 5\' 7"  (1.702 m) 99.8 kg  06/26/20 1307 (!) 166/110 98.1 F (36.7 C) Oral 100 16 99 % -- --    3:27 PM Reevaluation with update and discussion. After initial assessment and treatment, an updated evaluation reveals at this point she request a COVID test.  We will send 6-24 test and discharge patient.  She will check for results later this evening.Daleen Bo     Medical Decision Making:  This patient is presenting for evaluation of sexual disease exposure, which does require a range of treatment options, and is a complaint that involves a moderate risk of morbidity and mortality. The differential diagnoses include STD, vaginitis, unsafe sex practice. I decided to review old records, and in summary [place] adult female presenting for evaluation of exposure to STDs.  She prefers to be treated empirically.  Screening labs ordered..  I did not require additional historical information from anyone.   Critical Interventions-labs ordered, cell collection.  Empiric treatment begun.  Patient has reported Flagyl allergy however it is apparently dry skin only.  Therefore will give prescription for Flagyl.  She has previously received Flagyl, and tolerated.  After These Interventions, the Patient was reevaluated and was found stable for discharge.  CRITICAL CARE-no Performed by: Daleen Bo  Nursing Notes Reviewed/ Care Coordinated Applicable Imaging Reviewed Interpretation of Laboratory Data incorporated into ED treatment  The patient appears reasonably screened and/or stabilized for discharge and I doubt  any other medical condition or other [facility] requiring further screening, evaluation, or treatment in the ED at this time prior to discharge.  Plan: Home Medications-symptomatic OTC as needed; Home Treatments-Courage advance diet and activity; return here if the recommended treatment, does not improve the symptoms; Recommended follow up-PCP,.     Final Clinical Impression(s) / ED Diagnoses Final diagnoses:  STD exposure    Rx / DC Orders ED Discharge Orders    None      Daleen Bo, MD 06/26/20 1530

## 2020-06-26 NOTE — H&P (Addendum)
Behavioral Health Medical Screening Exam  Amy Dougherty is a 25 y.o. female presents to [facility] behavioral health after recent discharge from [facility] due to reported issues with her" blood pressure."  Amy Dougherty is requesting an evaluation for postpartum depression.  Reports" I do not feel depressed" I just need an evaluation for custody court.  She denied suicidal or homicidal ideations.  Denies auditory visual hallucinations.  Denies irritability, poor concentration, psychosis, social isolation or any other depressive symptoms.  Denied previous inpatient admissions. patient denied that she was diagnosed with depression with her other children. Reports she recently moved here from [place] so she is looking for primary care/OB/GYN.  Amy Dougherty is not very forthright during this assessment.  Stated " I was placed on magnesium and I declined to take this medication and this  started the whole situation."  NP attempted to obtain additional collateral requested to speak to patient's mother who she reports has custody of all 3 of her children.  She declined.  Follow-up Amy Dougherty during open access and or primary care for postpartum follow-up.  Support, encouragement and  reassurance was provided.  Total Time spent with patient: 15 minutes  Psychiatric Specialty Exam:  Presentation  General Appearance: Appropriate for Environment  Eye Contact:Good  Speech:Clear and Coherent  Speech Volume:Normal  Handedness:Left   Mood and Affect  Mood:Depressed  Affect:Congruent   Thought Process  Thought Processes:Coherent  Descriptions of Associations:Intact  Orientation:Full (Time, Place and Person)  Thought Content:Logical  History of Schizophrenia/Schizoaffective disorder:No data recorded Duration of Psychotic Symptoms:No data recorded Hallucinations:Hallucinations: None  Ideas of Reference:None  Suicidal Thoughts:Suicidal Thoughts: No  Homicidal Thoughts:Homicidal Thoughts: No   Sensorium   Memory:Recent Good; Immediate Fair  Judgment:Fair  Insight:Fair   Executive Functions  Concentration:Fair  Attention Span:Fair  [place] of Knowledge:Good  Language:Good   Psychomotor Activity  Psychomotor Activity:Psychomotor Activity: Normal   Assets  Assets:Intimacy; Desire for Improvement   Sleep  Sleep:Sleep: Fair    Physical Exam: Physical Exam Vitals and nursing note reviewed.  Cardiovascular:     Rate and Rhythm: Normal rate and regular rhythm.  Neurological:     Mental Status: She is alert.  Psychiatric:        Attention and Perception: Attention normal.        Mood and Affect: Mood normal.        Speech: Speech normal.        Behavior: Behavior normal.        Thought Content: Thought content normal.        Cognition and Memory: Cognition normal.    Review of Systems  Psychiatric/Behavioral: Negative for depression and suicidal ideas. The patient is not nervous/anxious.   All other systems reviewed and are negative.  Blood pressure (!) 154/88, pulse 77, temperature 98.7 F (37.1 C), temperature source Oral, resp. rate 16, last menstrual period 06/12/2020, SpO2 98 %, unknown if currently breastfeeding. There is no height or weight on file to calculate BMI.  Musculoskeletal: Strength & Muscle Tone: within normal limits Gait & Station: normal Patient leans: N/A   Recommendations:  Based on my evaluation the patient does not appear to have an emergency medical condition.  - Consider follow-up with [facility] for open access - Primary Care/ OBGYN    Derrill Center, NP 06/26/2020, 4:48 PM

## 2020-06-26 NOTE — ED Triage Notes (Addendum)
Patient reports she had unprotected sex with individual a week or two ago, now that person has notified patient they have chlamydia, gonorrhea, and trichomoniasis. Patient reports green and cloudy discharge, foul odor, itching in vaginal area. Patient requesting treatment. Patient asked if she can have prescription for carvedilol, and requested to be tested covid 19. Patient also requested a pregnancy test.

## 2020-06-27 LAB — SARS CORONAVIRUS 2 (TAT 6-24 HRS): SARS Coronavirus 2: NEGATIVE

## 2020-06-28 LAB — GC/CHLAMYDIA PROBE AMP (~~LOC~~) NOT AT ARMC
Chlamydia: NEGATIVE
Comment: NEGATIVE
Comment: NORMAL
Neisseria Gonorrhea: NEGATIVE

## 2020-10-05 ENCOUNTER — Encounter (HOSPITAL_COMMUNITY): Payer: Self-pay | Admitting: *Deleted

## 2020-10-05 ENCOUNTER — Emergency Department (HOSPITAL_COMMUNITY)
Admission: EM | Admit: 2020-10-05 | Discharge: 2020-10-05 | Disposition: A | Discharge status: Home / Self Care | Payer: Medicaid Other | Attending: Emergency Medicine | Admitting: Emergency Medicine

## 2020-10-05 ENCOUNTER — Other Ambulatory Visit: Payer: Self-pay

## 2020-10-05 DIAGNOSIS — Z91040 Latex allergy status: Secondary | ICD-10-CM | POA: Insufficient documentation

## 2020-10-05 DIAGNOSIS — N898 Other specified noninflammatory disorders of vagina: Secondary | ICD-10-CM

## 2020-10-05 DIAGNOSIS — R102 Pelvic and perineal pain: Secondary | ICD-10-CM | POA: Diagnosis present

## 2020-10-05 DIAGNOSIS — Z202 Contact with and (suspected) exposure to infections with a predominantly sexual mode of transmission: Secondary | ICD-10-CM

## 2020-10-05 LAB — WET PREP, GENITAL
Sperm: NONE SEEN
Trich, Wet Prep: NONE SEEN
WBC, Wet Prep HPF POC: NONE SEEN
Yeast Wet Prep HPF POC: NONE SEEN

## 2020-10-05 LAB — URINALYSIS, ROUTINE W REFLEX MICROSCOPIC
Bilirubin Urine: NEGATIVE
Glucose, UA: NEGATIVE mg/dL
Hgb urine dipstick: NEGATIVE
Ketones, ur: NEGATIVE mg/dL
Leukocytes,Ua: NEGATIVE
Nitrite: NEGATIVE
Protein, ur: NEGATIVE mg/dL
Specific Gravity, Urine: 1.015 (ref 1.005–1.030)
pH: 5 (ref 5.0–8.0)

## 2020-10-05 LAB — PREGNANCY, URINE: Preg Test, Ur: NEGATIVE

## 2020-10-05 MED ORDER — FLUCONAZOLE 150 MG PO TABS: 150.0000 mg | ORAL_TABLET | Freq: Every day | ORAL | 0 refills | Status: DC

## 2020-10-05 MED ORDER — CEFTRIAXONE SODIUM 1 G IJ SOLR
1.0000 g | Freq: Once | INTRAMUSCULAR | Status: AC
  Administered 2020-10-05: 1 g via INTRAMUSCULAR
  Filled 2020-10-05: qty 10

## 2020-10-05 MED ORDER — AZITHROMYCIN 250 MG PO TABS
1000.0000 mg | ORAL_TABLET | Freq: Once | ORAL | Status: AC
  Administered 2020-10-05: 1000 mg via ORAL
  Filled 2020-10-05: qty 4

## 2020-10-05 MED ORDER — LIDOCAINE HCL 1 % IJ SOLN
INTRAMUSCULAR | Status: AC
  Administered 2020-10-05: 2.1 mL
  Filled 2020-10-05: qty 20

## 2020-10-05 MED ORDER — METRONIDAZOLE 500 MG PO TABS
2000.0000 mg | ORAL_TABLET | Freq: Once | ORAL | Status: AC
  Administered 2020-10-05: 2000 mg via ORAL
  Filled 2020-10-05: qty 4

## 2020-10-05 NOTE — ED Triage Notes (Signed)
Pt reports exposure to gonorrhea/chlamydia 1.5 weeks. She also reports she had a rash that a ED doctor in [place] thought was monkeypox, told to quarantine for 21 days. She said they tested it but she didn't get results. Rash symptoms end of July/early August. Also has yeast infection.

## 2020-10-05 NOTE — ED Notes (Signed)
Pt refusing vitals prior to discharge. Pt A+Ox4 in NAD during discharge.

## 2020-10-05 NOTE — ED Provider Notes (Signed)
[facility] DEPT Provider Note   CSN: [id number] Arrival date & time: 10/05/20  1358     History Chief Complaint  Patient presents with   Exposure to STD    Amy Dougherty is a 25 y.o. female with a PMH significant for recent monkeypox diagnosis around 2 weeks ago who presents with a complaint of vaginal pain, discharge after being exposed to someone with GC/chlamydia, trichomonas. She was supposed to have been quarantining but did not follow this guidance. Patient reports she also has a HSV infection but is not complaining of that at this time. Patient reports her monkeypox is largely resolved, and she has been treating with a topical corticosteroid. She reports that she used the steroid around her vaginal region and thinks she now has a yeast infection as well. She has not tried anything for her vaginal discharge yet.   Exposure to STD      Past Medical History:  Diagnosis Date   Herpes genitalis in women    currently taking medications   History of blood clots    Blood clot in  Right overy after delivery   Wisdom teeth extracted     Patient Active Problem List   Diagnosis Date Noted   Bipolar 1 disorder, depressed, severe ([place]) 11/05/2013   Oral herpes simplex infection 05/22/2013   Gonorrhea 05/22/2013   Candidiasis of perineum 05/22/2013   Dermatofibroma of neck 05/22/2013   History of eczema 05/22/2013   Genital herpes 05/16/2013   Chlamydia vaginitis/cervicitis 12/14/2012   Molluscum contagiosum infection 11/29/2012   GERD without esophagitis 10/10/2012   Contraceptive management 06/25/2012   Dysfunctional uterine bleeding 08/26/2011   Normocytic anemia 08/26/2011   History of venous thrombosis 03/09/2010    Past Surgical History:  Procedure Laterality Date   WISDOM TOOTH EXTRACTION       OB History     Gravida  4   Para  2   Term  2   Preterm  0   AB  1   Living  2      SAB  1   IAB  0   Ectopic  0    Multiple  0   Live Births  2           Family History  Problem Relation Age of Onset   Asthma Brother     Social History   Tobacco Use   Smoking status: Never   Smokeless tobacco: Never  Vaping Use   Vaping Use: Never used  Substance Use Topics   Alcohol use: Not Currently    Alcohol/week: 0.0 standard drinks   Drug use: No    Home Medications Prior to Admission medications   Medication Sig Start Date End Date Taking? Authorizing Provider  acetaminophen (TYLENOL) 500 MG tablet Take 500 mg by mouth every 6 (six) hours as needed for mild pain or headache.     [provider]  azithromycin (ZITHROMAX) 500 MG tablet Take two tablets once Patient not taking: Reported on 06/29/2019 07/27/17   Shelly Bombard, MD  calcium carbonate (OS-CAL) 1250 (500 Ca) MG chewable tablet Chew 1 tablet by mouth daily as needed for heartburn.     [provider]  carvedilol (COREG) 6.25 MG tablet Take 1 tablet (6.25 mg total) by mouth 2 (two) times daily with a meal. 06/26/20   Daleen Bo, MD  clindamycin (CLEOCIN) 300 MG capsule Take 1 capsule (300 mg total) by mouth 3 (three) times daily. Patient  not taking: Reported on 06/29/2019 06/16/17   Shelly Bombard, MD  diphenhydrAMINE HCl (BENADRYL ALLERGY PO) Take 1 tablet by mouth daily.    [provider]  doxycycline (VIBRAMYCIN) 100 MG capsule Take 1 capsule (100 mg total) by mouth 2 (two) times daily. One po bid x 7 days 06/26/20   Daleen Bo, MD  drospirenone-ethinyl estradiol (YAZ) 3-0.02 MG tablet Take 1 tablet by mouth daily. Patient not taking: Reported on 06/29/2019 06/14/17   Shelly Bombard, MD  hydrocortisone 2.5 % lotion Apply topically 2 (two) times daily. Apply to affected area/rash. Avoid use on face. Patient not taking: Reported on 06/29/2019 06/20/18   Antonietta Breach, PA-C  metroNIDAZOLE (FLAGYL) 500 MG tablet Take 1 tablet (500 mg total) by mouth 2 (two) times daily. One po bid x 7 days 06/26/20   Daleen Bo, MD  nitrofurantoin, macrocrystal-monohydrate, (MACROBID) 100 MG capsule Take 1 capsule (100 mg total) by mouth 2 (two) times daily. 01/06/20   Rolland Porter, MD  ondansetron (ZOFRAN ODT) 4 MG disintegrating tablet Take 1 tablet (4 mg total) by mouth every 8 (eight) hours as needed for nausea or vomiting. 06/29/19   Suzy Bouchard, PA-C  cetirizine (ZYRTEC) 10 MG tablet Take 10 mg by mouth daily as needed. For cold symptoms  04/25/11  [provider]    Allergies    Flagyl [metronidazole], Apple, Cherry, Ibuprofen, Latex, Norethindrone, and Other  Review of Systems   Review of Systems  Genitourinary:  Positive for pelvic pain, vaginal discharge and vaginal pain.  All other systems reviewed and are negative.  Physical Exam Updated Vital Signs BP (!) 136/106 (BP Location: Left Arm)   Pulse 92   Temp 97.9 F (36.6 C) (Oral)   Resp 17   LMP 09/28/2020   SpO2 96%   Physical Exam Vitals and nursing note reviewed.  Constitutional:      General: She is not in acute distress.    Appearance: Normal appearance.  HENT:     Head: Normocephalic and atraumatic.  Eyes:     General:        Right eye: No discharge.        Left eye: No discharge.  Cardiovascular:     Rate and Rhythm: Normal rate and regular rhythm.  Pulmonary:     Effort: Pulmonary effort is normal. No respiratory distress.  Genitourinary:    Comments: Given patients recent monkey pox exposure and clear history of contact, we have opted to have patient self-swab today for her wet prep and submit a urine sample for her GC / chlamydia Musculoskeletal:        General: No deformity.  Skin:    General: Skin is warm and dry.     Comments: Several scattered crusted excoriations in various stages of healing on arms  Neurological:     Mental Status: She is alert and oriented to person, place, and time.  Psychiatric:        Mood and Affect: Mood normal.        Behavior: Behavior normal.    ED Results /  Procedures / Treatments   Labs (all labs ordered are listed, but only abnormal results are displayed) Labs Reviewed  WET PREP, GENITAL  URINALYSIS, ROUTINE W REFLEX MICROSCOPIC  PREGNANCY, URINE  GC/CHLAMYDIA PROBE AMP ([place]) NOT AT [facility]    EKG None  Radiology No results found.  Procedures Procedures   Medications Ordered in ED Medications - No data to display  ED Course  I have reviewed the triage vital signs and the nursing notes.  Pertinent labs & imaging results that were available during my care of the patient were reviewed by me and considered in my medical decision making (see chart for details).    MDM Rules/Calculators/A&P                          I discussed this case with my attending physician who cosigned this note including patient's presenting symptoms, physical exam, and planned diagnostics and interventions. Attending physician stated agreement with plan or made changes to plan which were implemented.   Patient counseled on safe sex practices and encouraged to use protection. Patient counseled that she should quarantine for her monkeypox diagnosis as she was originally told to do until the 21 days are complete. Patient instructed to inform sexual partners about exposure and not engage in sexual intercourse until symptoms fully resolved.  Patient with known contact to confirmed GC / Chlamydia / Trichomonas per report and with symptoms of vaginal discharge and irritation. Patient also complains of yeast infection with white discharge. UA, pregnancy test, GC/Chlamydia urine probe, and wet prep performed and patient instructed these test results will be available on her portal. Will treat presumptively for GC/Chlamydia/Trichomonas/fluconazole at this time. Patient instructed to follow up if symptoms do not resolve or worsen. Risks of improper treatment including TOA, systemic infection discussed.  Final Clinical Impression(s) / ED Diagnoses Final diagnoses:   None    Rx / DC Orders ED Discharge Orders     None        Dorien Chihuahua 10/05/20 1612    Valarie Merino, MD 10/06/20 2232

## 2020-10-05 NOTE — Discharge Instructions (Signed)
Please notify all of your recent sexual partners about your recent exposure. Please refrain from intercourse until all of your symptoms resolve. You can check your online chart for the results of the testing we performed today. Given your recent monkeypox diagnosis I highly recommend that you quarantine for the remained of the 21 days instructed while you were in [place]. I have sent the prescription for your fluconazole to the pharmacy you requested in [place]. Your other infections should be adequately treated by the medications we gave you today.

## 2020-10-06 LAB — GC/CHLAMYDIA PROBE AMP (~~LOC~~) NOT AT ARMC
Chlamydia: NEGATIVE
Comment: NEGATIVE
Comment: NORMAL
Neisseria Gonorrhea: NEGATIVE

## 2020-11-15 ENCOUNTER — Emergency Department (HOSPITAL_COMMUNITY)
Admission: EM | Admit: 2020-11-15 | Discharge: 2020-11-15 | Disposition: A | Discharge status: Home / Self Care | Payer: Medicaid Other | Attending: Emergency Medicine | Admitting: Emergency Medicine

## 2020-11-15 ENCOUNTER — Other Ambulatory Visit: Payer: Self-pay

## 2020-11-15 ENCOUNTER — Encounter (HOSPITAL_COMMUNITY): Payer: Self-pay

## 2020-11-15 DIAGNOSIS — Z202 Contact with and (suspected) exposure to infections with a predominantly sexual mode of transmission: Secondary | ICD-10-CM | POA: Insufficient documentation

## 2020-11-15 DIAGNOSIS — N898 Other specified noninflammatory disorders of vagina: Secondary | ICD-10-CM | POA: Diagnosis not present

## 2020-11-15 DIAGNOSIS — Z91040 Latex allergy status: Secondary | ICD-10-CM | POA: Diagnosis not present

## 2020-11-15 LAB — URINALYSIS, ROUTINE W REFLEX MICROSCOPIC
Bilirubin Urine: NEGATIVE
Glucose, UA: NEGATIVE mg/dL
Ketones, ur: NEGATIVE mg/dL
Leukocytes,Ua: NEGATIVE
Nitrite: NEGATIVE
Protein, ur: 100 mg/dL — AB
RBC / HPF: >50 RBC/hpf — ABNORMAL HIGH (ref 0–5)
Specific Gravity, Urine: 1.021 (ref 1.005–1.030)
pH: 6 (ref 5.0–8.0)

## 2020-11-15 LAB — WET PREP, GENITAL
Clue Cells Wet Prep HPF POC: NONE SEEN
Sperm: NONE SEEN
Trich, Wet Prep: NONE SEEN
Yeast Wet Prep HPF POC: NONE SEEN

## 2020-11-15 LAB — HIV ANTIBODY (ROUTINE TESTING W REFLEX): HIV Screen 4th Generation wRfx: NONREACTIVE

## 2020-11-15 LAB — I-STAT BETA HCG BLOOD, ED (MC, WL, AP ONLY): I-stat hCG, quantitative: <5 m[IU]/mL (ref <5)

## 2020-11-15 MED ORDER — ACETAMINOPHEN 500 MG PO TABS
1000.0000 mg | ORAL_TABLET | Freq: Once | ORAL | Status: AC
  Administered 2020-11-15: 1000 mg via ORAL
  Filled 2020-11-15: qty 2

## 2020-11-15 MED ORDER — CEFTRIAXONE SODIUM 500 MG IJ SOLR
500.0000 mg | Freq: Once | INTRAMUSCULAR | Status: AC
  Administered 2020-11-15: 500 mg via INTRAMUSCULAR
  Filled 2020-11-15: qty 500

## 2020-11-15 MED ORDER — METRONIDAZOLE 500 MG PO TABS
500.0000 mg | ORAL_TABLET | Freq: Once | ORAL | Status: AC
  Administered 2020-11-15: 500 mg via ORAL
  Filled 2020-11-15: qty 1

## 2020-11-15 MED ORDER — METRONIDAZOLE 500 MG PO TABS: 500.0000 mg | ORAL_TABLET | Freq: Two times a day (BID) | ORAL | 0 refills | Status: AC

## 2020-11-15 MED ORDER — VALACYCLOVIR HCL 1 G PO TABS: 500.0000 mg | ORAL_TABLET | Freq: Two times a day (BID) | ORAL | 0 refills | Status: AC

## 2020-11-15 MED ORDER — DOXYCYCLINE HYCLATE 100 MG PO TABS
100.0000 mg | ORAL_TABLET | Freq: Once | ORAL | Status: AC
  Administered 2020-11-15: 100 mg via ORAL
  Filled 2020-11-15: qty 1

## 2020-11-15 MED ORDER — STERILE WATER FOR INJECTION IJ SOLN
INTRAMUSCULAR | Status: AC
  Filled 2020-11-15: qty 10

## 2020-11-15 MED ORDER — DOXYCYCLINE HYCLATE 100 MG PO CAPS: 100.0000 mg | ORAL_CAPSULE | Freq: Two times a day (BID) | ORAL | 0 refills | Status: AC

## 2020-11-15 NOTE — ED Provider Notes (Signed)
[facility] EMERGENCY DEPARTMENT Provider Note   CSN: [id number] Arrival date & time: 11/15/20  1122     History No chief complaint on file.   Amy Dougherty is a 25 y.o. female.  HPI This is a 25 year old female with history of herpes, gonorrhea and chlamydia, and bipolar disorder who presents with concern of STD exposure.  Patient reports that 2 weeks ago she had unprotected vaginal and oral sex with a female partner.  He now says that he is tested positive for gonorrhea, chlamydia, and trichomonas.  She endorses vaginal discharge and irritation, denies any malodorous discharge.  Also has dysuria.  Currently menstruating and having some normal associated lower abdominal cramping.  Denies fever, chills, cough, congestion, chest pain.  Denies vomiting or diarrhea.    Past Medical History:  Diagnosis Date   Herpes genitalis in women    currently taking medications   History of blood clots    Blood clot in  Right overy after delivery   Wisdom teeth extracted     Patient Active Problem List   Diagnosis Date Noted   Bipolar 1 disorder, depressed, severe ([place]) 11/05/2013   Oral herpes simplex infection 05/22/2013   Gonorrhea 05/22/2013   Candidiasis of perineum 05/22/2013   Dermatofibroma of neck 05/22/2013   History of eczema 05/22/2013   Genital herpes 05/16/2013   Chlamydia vaginitis/cervicitis 12/14/2012   Molluscum contagiosum infection 11/29/2012   GERD without esophagitis 10/10/2012   Contraceptive management 06/25/2012   Dysfunctional uterine bleeding 08/26/2011   Normocytic anemia 08/26/2011   History of venous thrombosis 03/09/2010    Past Surgical History:  Procedure Laterality Date   WISDOM TOOTH EXTRACTION       OB History     Gravida  4   Para  2   Term  2   Preterm  0   AB  1   Living  2      SAB  1   IAB  0   Ectopic  0   Multiple  0   Live Births  2           Family History  Problem Relation Age of Onset    Asthma Brother     Social History   Tobacco Use   Smoking status: Never   Smokeless tobacco: Never  Vaping Use   Vaping Use: Never used  Substance Use Topics   Alcohol use: Not Currently    Alcohol/week: 0.0 standard drinks   Drug use: No    Home Medications Prior to Admission medications   Medication Sig Start Date End Date Taking? Authorizing Provider  acetaminophen (TYLENOL) 500 MG tablet Take 500 mg by mouth every 6 (six) hours as needed for mild pain or headache.    Yes [provider]  doxycycline (VIBRAMYCIN) 100 MG capsule Take 1 capsule (100 mg total) by mouth 2 (two) times daily for 7 days. 11/15/20 11/22/20 Yes Coralee Pesa, MD  metroNIDAZOLE (FLAGYL) 500 MG tablet Take 1 tablet (500 mg total) by mouth 2 (two) times daily for 7 days. 11/15/20 11/22/20 Yes Coralee Pesa, MD  valACYclovir (VALTREX) 1000 MG tablet Take 0.5 tablets (500 mg total) by mouth 2 (two) times daily for 10 days. 11/15/20 11/25/20 Yes Coralee Pesa, MD  azithromycin (ZITHROMAX) 500 MG tablet Take two tablets once Patient not taking: No sig reported 07/27/17   Shelly Bombard, MD  carvedilol (COREG) 6.25 MG tablet Take 1 tablet (6.25 mg total) by  mouth 2 (two) times daily with a meal. Patient not taking: Reported on 11/15/2020 06/26/20   Daleen Bo, MD  clindamycin (CLEOCIN) 300 MG capsule Take 1 capsule (300 mg total) by mouth 3 (three) times daily. Patient not taking: No sig reported 06/16/17   Shelly Bombard, MD  doxycycline (VIBRAMYCIN) 100 MG capsule Take 1 capsule (100 mg total) by mouth 2 (two) times daily. One po bid x 7 days Patient not taking: Reported on 11/15/2020 06/26/20   Daleen Bo, MD  drospirenone-ethinyl estradiol (YAZ) 3-0.02 MG tablet Take 1 tablet by mouth daily. Patient not taking: No sig reported 06/14/17   Shelly Bombard, MD  fluconazole (DIFLUCAN) 150 MG tablet Take 1 tablet (150 mg total) by mouth daily. Take 1 tablet today and another in one week  if symptoms have not resolved Patient not taking: Reported on 11/15/2020 10/05/20   Prosperi, Christian H, PA-C  hydrocortisone 2.5 % lotion Apply topically 2 (two) times daily. Apply to affected area/rash. Avoid use on face. Patient not taking: No sig reported 06/20/18   Antonietta Breach, PA-C  metroNIDAZOLE (FLAGYL) 500 MG tablet Take 1 tablet (500 mg total) by mouth 2 (two) times daily. One po bid x 7 days Patient not taking: Reported on 11/15/2020 06/26/20   Daleen Bo, MD  nitrofurantoin, macrocrystal-monohydrate, (MACROBID) 100 MG capsule Take 1 capsule (100 mg total) by mouth 2 (two) times daily. Patient not taking: Reported on 11/15/2020 01/06/20   Rolland Porter, MD  ondansetron (ZOFRAN ODT) 4 MG disintegrating tablet Take 1 tablet (4 mg total) by mouth every 8 (eight) hours as needed for nausea or vomiting. Patient not taking: Reported on 11/15/2020 06/29/19   Suzy Bouchard, PA-C  cetirizine (ZYRTEC) 10 MG tablet Take 10 mg by mouth daily as needed. For cold symptoms  04/25/11  [provider]    Allergies    Apple, Cherry, Ibuprofen, Latex, Norethindrone, and Other  Review of Systems   Review of Systems  Constitutional:  Negative for chills and fever.  HENT:  Negative for ear pain and sore throat.   Eyes:  Negative for pain and visual disturbance.  Respiratory:  Negative for cough and shortness of breath.   Cardiovascular:  Negative for chest pain and palpitations.  Gastrointestinal:  Positive for abdominal pain. Negative for vomiting.  Genitourinary:  Positive for dysuria, vaginal bleeding, vaginal discharge and vaginal pain. Negative for flank pain and hematuria.  Musculoskeletal:  Negative for arthralgias and back pain.  Skin:  Negative for color change and rash.  Neurological:  Negative for seizures and syncope.  All other systems reviewed and are negative.  Physical Exam Updated Vital Signs BP (!) 158/90   Pulse 81   Temp 98.5 F (36.9 C) (Oral)   Resp 16    SpO2 94%   Physical Exam Vitals and nursing note reviewed.  Constitutional:      General: She is not in acute distress.    Appearance: She is well-developed.  HENT:     Head: Normocephalic and atraumatic.  Eyes:     Conjunctiva/sclera: Conjunctivae normal.  Cardiovascular:     Rate and Rhythm: Normal rate and regular rhythm.     Heart sounds: No murmur heard. Pulmonary:     Effort: Pulmonary effort is normal. No respiratory distress.     Breath sounds: Normal breath sounds.  Abdominal:     Palpations: Abdomen is soft.     Tenderness: There is no abdominal tenderness.  Genitourinary:    General:  Normal vulva.     Vagina: Bleeding present.     Cervix: No cervical motion tenderness or friability.     Adnexa: Right adnexa normal and left adnexa normal.  Musculoskeletal:     Cervical back: Neck supple.  Skin:    General: Skin is warm and dry.  Neurological:     Mental Status: She is alert.    ED Results / Procedures / Treatments   Labs (all labs ordered are listed, but only abnormal results are displayed) Labs Reviewed  WET PREP, GENITAL - Abnormal; Notable for the following components:      Result Value   WBC, Wet Prep HPF POC MODERATE (*)    All other components within normal limits  URINALYSIS, ROUTINE W REFLEX MICROSCOPIC - Abnormal; Notable for the following components:   Color, Urine AMBER (*)    APPearance CLOUDY (*)    Hgb urine dipstick MODERATE (*)    Protein, ur 100 (*)    RBC / HPF >50 (*)    Bacteria, UA RARE (*)    All other components within normal limits  HIV ANTIBODY (ROUTINE TESTING W REFLEX)  RPR  I-STAT BETA HCG BLOOD, ED ([facility], [facility], [facility] ONLY)  GC/CHLAMYDIA PROBE AMP ([place]) NOT AT [facility]    EKG None  Radiology No results found.  Procedures Procedures   Medications Ordered in ED Medications  acetaminophen (TYLENOL) tablet 1,000 mg (1,000 mg Oral Given 11/15/20 1808)  cefTRIAXone (ROCEPHIN) injection 500 mg (500 mg Intramuscular Given  11/15/20 1937)  doxycycline (VIBRA-TABS) tablet 100 mg (100 mg Oral Given 11/15/20 1937)  metroNIDAZOLE (FLAGYL) tablet 500 mg (500 mg Oral Given 11/15/20 1937)  sterile water (preservative free) injection (  Given 11/15/20 1940)    ED Course  I have reviewed the triage vital signs and the nursing notes.  Pertinent labs & imaging results that were available during my care of the patient were reviewed by me and considered in my medical decision making (see chart for details).    MDM Rules/Calculators/A&P                          Patient is hemodynamically stable and well-appearing.  Abdomen is soft and nontender.  No unilateral abdominal or pelvic pain.  Patient is afebrile without signs of systemic infection.  Pregnancy test is negative.  No cervical motion tenderness or adnexal fullness on exam.  No evidence of TOA.  Low suspicion for appendicitis or other acute surgical intra-abdominal pathology.  No obvious discharge, however exam is limited by patient's active menstruation.  Will empirically treat for PID and include Flagyl given patient's possible exposure to trichomonas.  UA negative for UTI. Discharged home in stable condition. Given strict return precautions.   Final Clinical Impression(s) / ED Diagnoses Final diagnoses:  Vaginal discharge  Exposure to STD    Rx / DC Orders ED Discharge Orders          Ordered    valACYclovir (VALTREX) 1000 MG tablet  2 times daily        11/15/20 1927    doxycycline (VIBRAMYCIN) 100 MG capsule  2 times daily        11/15/20 1927    metroNIDAZOLE (FLAGYL) 500 MG tablet  2 times daily        11/15/20 1927             Coralee Pesa, MD 11/16/20 [id number]    Sherwood Gambler, MD 11/19/20 [phone]

## 2020-11-15 NOTE — Discharge Instructions (Signed)
Take both Flagyl and doxycycline twice daily for 7 days.  Follow-up with your primary care doctor or OB/GYN for ongoing symptoms.  Return to the ED with worsening abdominal pain, fever, or vomiting.

## 2020-11-15 NOTE — ED Triage Notes (Signed)
Patient complains of vaginal discharge and dysuria for a few days. States that she was informed she was exposed to STD. Patient also complains of congestion and cough

## 2020-11-15 NOTE — ED Provider Notes (Signed)
Emergency Medicine Provider Triage Evaluation Note  Amy Dougherty , a 25 y.o. female  was evaluated in triage.  Pt complains of vaginal discharge that began a few days ago.  Also complaining of dysuria and vaginal irritation.  She reports she was exposed to gonorrhea, chlamydia, and trichomonas.  She states that she also has a yeast infection today.  Is also requesting Valtrex.  She states that she has known herpes.  Review of Systems  Positive: + vaginal discharge Negative: - pelvic pain  Physical Exam  BP (!) 149/94 (BP Location: Right Arm)   Pulse 71   Temp 98.9 F (37.2 C)   Resp 18   SpO2 99%  Gen:   Awake, no distress   Resp:  Normal effort  MSK:   Moves extremities without difficulty  Other:  GU exam deferred in triage  Medical Decision Making  Medically screening exam initiated at 12:03 PM.  Appropriate orders placed.  Amy Dougherty was informed that the remainder of the evaluation will be completed by another provider, this initial triage assessment does not replace that evaluation, and the importance of remaining in the ED until their evaluation is complete.     Eustaquio Maize, PA-C 11/15/20 [place], [place], DO 11/15/20 1253

## 2020-11-16 LAB — GC/CHLAMYDIA PROBE AMP (~~LOC~~) NOT AT ARMC
Chlamydia: NEGATIVE
Comment: NEGATIVE
Comment: NORMAL
Neisseria Gonorrhea: NEGATIVE

## 2020-11-16 LAB — RPR: RPR Ser Ql: NONREACTIVE

## 2021-04-11 ENCOUNTER — Other Ambulatory Visit: Payer: Self-pay

## 2021-04-11 ENCOUNTER — Emergency Department (HOSPITAL_COMMUNITY)
Admission: EM | Admit: 2021-04-11 | Discharge: 2021-04-12 | Disposition: A | Discharge status: Home / Self Care | Payer: Medicaid Other | Attending: Emergency Medicine | Admitting: Emergency Medicine

## 2021-04-11 ENCOUNTER — Encounter (HOSPITAL_COMMUNITY): Payer: Self-pay

## 2021-04-11 DIAGNOSIS — R1030 Lower abdominal pain, unspecified: Secondary | ICD-10-CM | POA: Insufficient documentation

## 2021-04-11 DIAGNOSIS — N898 Other specified noninflammatory disorders of vagina: Secondary | ICD-10-CM | POA: Insufficient documentation

## 2021-04-11 DIAGNOSIS — Z5321 Procedure and treatment not carried out due to patient leaving prior to being seen by health care provider: Secondary | ICD-10-CM | POA: Diagnosis not present

## 2021-04-11 LAB — URINALYSIS, ROUTINE W REFLEX MICROSCOPIC
Bilirubin Urine: NEGATIVE
Glucose, UA: NEGATIVE mg/dL
Hgb urine dipstick: NEGATIVE
Ketones, ur: NEGATIVE mg/dL
Nitrite: NEGATIVE
Protein, ur: NEGATIVE mg/dL
Specific Gravity, Urine: 1.003 — ABNORMAL LOW (ref 1.005–1.030)
pH: 6 (ref 5.0–8.0)

## 2021-04-11 LAB — I-STAT BETA HCG BLOOD, ED (MC, WL, AP ONLY): I-stat hCG, quantitative: <5 m[IU]/mL (ref <5)

## 2021-04-11 LAB — HIV ANTIBODY (ROUTINE TESTING W REFLEX): HIV Screen 4th Generation wRfx: NONREACTIVE

## 2021-04-11 NOTE — ED Triage Notes (Signed)
Patient c/o lower abdominal pain and vaginal discharge x 2 weeks. Patient wants a pregnancy test. ? ?Patient states that she was told by a a sexual partner that he had an STD. Patient states he did not speak [place]. ?

## 2021-04-11 NOTE — ED Provider Triage Note (Signed)
Emergency Medicine Provider Triage Evaluation Note ? ?Amy Dougherty , a 26 y.o. female  was evaluated in triage.  Pt complains of vaginal discharge.  Report lower abdominal cramping with vaginal discharge x 2 weeks, wants a preg test ? ?Review of Systems  ?Positive: Lower abd pain, vaginal discharge ?Negative: Fever, dysuria ? ?Physical Exam  ?BP (!) 159/114 (BP Location: Left Arm)   Pulse (!) 102   Temp 98 ?F (36.7 ?C) (Oral)   Resp 18   Ht '5\' 7"'$  (1.702 m)   Wt 107 kg   LMP 03/10/2021 (Approximate)   SpO2 98%   BMI 36.96 kg/m?  ?Gen:   Awake, no distress   ?Resp:  Normal effort  ?MSK:   Moves extremities without difficulty  ?Other:   ? ?Medical Decision Making  ?Medically screening exam initiated at 4:31 PM.  Appropriate orders placed.  Lania A Simenson was informed that the remainder of the evaluation will be completed by another provider, this initial triage assessment does not replace that evaluation, and the importance of remaining in the ED until their evaluation is complete. ? ?STI screening ?  ?Domenic Moras, PA-C ?04/11/21 1632 ? ?

## 2021-04-12 LAB — RPR: RPR Ser Ql: NONREACTIVE

## 2021-04-15 ENCOUNTER — Emergency Department (HOSPITAL_COMMUNITY)
Admission: EM | Admit: 2021-04-15 | Discharge: 2021-04-15 | Disposition: A | Discharge status: Home / Self Care | Payer: Medicaid Other | Attending: Emergency Medicine | Admitting: Emergency Medicine

## 2021-04-15 ENCOUNTER — Other Ambulatory Visit: Payer: Self-pay

## 2021-04-15 ENCOUNTER — Emergency Department (HOSPITAL_COMMUNITY): Payer: Medicaid Other

## 2021-04-15 ENCOUNTER — Encounter (HOSPITAL_COMMUNITY): Payer: Self-pay | Admitting: Emergency Medicine

## 2021-04-15 DIAGNOSIS — N760 Acute vaginitis: Secondary | ICD-10-CM

## 2021-04-15 DIAGNOSIS — N898 Other specified noninflammatory disorders of vagina: Secondary | ICD-10-CM | POA: Diagnosis not present

## 2021-04-15 DIAGNOSIS — Z202 Contact with and (suspected) exposure to infections with a predominantly sexual mode of transmission: Secondary | ICD-10-CM

## 2021-04-15 DIAGNOSIS — R059 Cough, unspecified: Secondary | ICD-10-CM | POA: Diagnosis not present

## 2021-04-15 DIAGNOSIS — Z91040 Latex allergy status: Secondary | ICD-10-CM | POA: Diagnosis not present

## 2021-04-15 DIAGNOSIS — B9689 Other specified bacterial agents as the cause of diseases classified elsewhere: Secondary | ICD-10-CM

## 2021-04-15 LAB — CBC WITH DIFFERENTIAL/PLATELET
Abs Immature Granulocytes: 0.03 10*3/uL (ref 0.00–0.07)
Basophils Absolute: 0 10*3/uL (ref 0.0–0.1)
Basophils Relative: 0 %
Eosinophils Absolute: 0.2 10*3/uL (ref 0.0–0.5)
Eosinophils Relative: 2 %
HCT: 40.8 % (ref 36.0–46.0)
Hemoglobin: 12.8 g/dL (ref 12.0–15.0)
Immature Granulocytes: 0 %
Lymphocytes Relative: 33 %
Lymphs Abs: 3.2 10*3/uL (ref 0.7–4.0)
MCH: 26.4 pg (ref 26.0–34.0)
MCHC: 31.4 g/dL (ref 30.0–36.0)
MCV: 84.3 fL (ref 80.0–100.0)
Monocytes Absolute: 0.7 10*3/uL (ref 0.1–1.0)
Monocytes Relative: 8 %
Neutro Abs: 5.7 10*3/uL (ref 1.7–7.7)
Neutrophils Relative %: 57 %
Platelets: 301 10*3/uL (ref 150–400)
RBC: 4.84 MIL/uL (ref 3.87–5.11)
RDW: 14.1 % (ref 11.5–15.5)
WBC: 9.9 10*3/uL (ref 4.0–10.5)
nRBC: 0 % (ref 0.0–0.2)

## 2021-04-15 LAB — URINALYSIS, ROUTINE W REFLEX MICROSCOPIC
Bilirubin Urine: NEGATIVE
Glucose, UA: NEGATIVE mg/dL
Hgb urine dipstick: NEGATIVE
Ketones, ur: NEGATIVE mg/dL
Leukocytes,Ua: NEGATIVE
Nitrite: NEGATIVE
Protein, ur: NEGATIVE mg/dL
Specific Gravity, Urine: 1.018 (ref 1.005–1.030)
pH: 6 (ref 5.0–8.0)

## 2021-04-15 LAB — WET PREP, GENITAL
Sperm: NONE SEEN
Trich, Wet Prep: NONE SEEN
WBC, Wet Prep HPF POC: <10 (ref <10)
Yeast Wet Prep HPF POC: NONE SEEN

## 2021-04-15 MED ORDER — METRONIDAZOLE 500 MG PO TABS
500.0000 mg | ORAL_TABLET | Freq: Once | ORAL | Status: AC
Start: 2021-04-15 — End: 2021-04-15
  Administered 2021-04-15: 500 mg via ORAL
  Filled 2021-04-15: qty 1

## 2021-04-15 MED ORDER — DOXYCYCLINE HYCLATE 100 MG PO TABS
100.0000 mg | ORAL_TABLET | Freq: Once | ORAL | Status: AC
  Administered 2021-04-15: 100 mg via ORAL
  Filled 2021-04-15: qty 1

## 2021-04-15 MED ORDER — CEFTRIAXONE SODIUM 500 MG IJ SOLR
500.0000 mg | Freq: Once | INTRAMUSCULAR | Status: AC
  Administered 2021-04-15: 500 mg via INTRAMUSCULAR
  Filled 2021-04-15: qty 500

## 2021-04-15 MED ORDER — STERILE WATER FOR INJECTION IJ SOLN
INTRAMUSCULAR | Status: AC
  Administered 2021-04-15: 10 mL
  Filled 2021-04-15: qty 10

## 2021-04-15 MED ORDER — DOXYCYCLINE HYCLATE 100 MG PO CAPS: 100.0000 mg | ORAL_CAPSULE | Freq: Two times a day (BID) | ORAL | 0 refills | Status: DC

## 2021-04-15 MED ORDER — METRONIDAZOLE 500 MG PO TABS
500.0000 mg | ORAL_TABLET | Freq: Two times a day (BID) | ORAL | 0 refills | Status: DC
Start: 2021-04-15 — End: 2021-10-25

## 2021-04-15 MED ORDER — SODIUM CHLORIDE 0.9 % IV BOLUS
1000.0000 mL | Freq: Once | INTRAVENOUS | Status: AC
  Administered 2021-04-15: 1000 mL via INTRAVENOUS

## 2021-04-15 NOTE — ED Triage Notes (Signed)
Pt arrive by EMS from home for c/o cough for the last 3 days with some bloody secretion per pt. Pt requesting to be check for STD. ?

## 2021-04-15 NOTE — ED Provider Notes (Signed)
?[facility] ?Provider Note ? ? ?CSN: [id number] ?Arrival date & time: 04/15/21  2150 ? ?  ? ?History ? ?Chief Complaint  ?Patient presents with  ? Cough  ?  Pt arrive by EMS from home for c/o cough for the last 3 days with some bloody secretion per pt. Pt requesting to be check for STD.  ? ? ?Amy Dougherty is a 26 y.o. female here wanting an STD check and has cough.  Patient states that she had unprotected sex today.  She states that she is concerned because she has some vaginal discharge.  Patient also had oral sex and had some cough with blood-tinged sputum.  Patient states that she was requesting STD testing and treatment. ? ?The history is provided by the patient.  ? ?  ? ?Home Medications ?Prior to Admission medications   ?Medication Sig Start Date End Date Taking? Authorizing Provider  ?acetaminophen (TYLENOL) 500 MG tablet Take 500 mg by mouth every 6 (six) hours as needed for mild pain or headache.     [provider]  ?azithromycin (ZITHROMAX) 500 MG tablet Take two tablets once ?Patient not taking: No sig reported 07/27/17   Shelly Bombard, MD  ?carvedilol (COREG) 6.25 MG tablet Take 1 tablet (6.25 mg total) by mouth 2 (two) times daily with a meal. ?Patient not taking: Reported on 11/15/2020 06/26/20   Daleen Bo, MD  ?clindamycin (CLEOCIN) 300 MG capsule Take 1 capsule (300 mg total) by mouth 3 (three) times daily. ?Patient not taking: No sig reported 06/16/17   Shelly Bombard, MD  ?doxycycline (VIBRAMYCIN) 100 MG capsule Take 1 capsule (100 mg total) by mouth 2 (two) times daily. One po bid x 7 days ?Patient not taking: Reported on 11/15/2020 06/26/20   Daleen Bo, MD  ?drospirenone-ethinyl estradiol (YAZ) 3-0.02 MG tablet Take 1 tablet by mouth daily. ?Patient not taking: No sig reported 06/14/17   Shelly Bombard, MD  ?fluconazole (DIFLUCAN) 150 MG tablet Take 1 tablet (150 mg total) by mouth daily. Take 1 tablet today and another in one week  if symptoms have not resolved ?Patient not taking: Reported on 11/15/2020 10/05/20   Prosperi, Christian H, PA-C  ?hydrocortisone 2.5 % lotion Apply topically 2 (two) times daily. Apply to affected area/rash. Avoid use on face. ?Patient not taking: No sig reported 06/20/18   Antonietta Breach, PA-C  ?metroNIDAZOLE (FLAGYL) 500 MG tablet Take 1 tablet (500 mg total) by mouth 2 (two) times daily. One po bid x 7 days ?Patient not taking: Reported on 11/15/2020 06/26/20   Daleen Bo, MD  ?nitrofurantoin, macrocrystal-monohydrate, (MACROBID) 100 MG capsule Take 1 capsule (100 mg total) by mouth 2 (two) times daily. ?Patient not taking: Reported on 11/15/2020 01/06/20   Rolland Porter, MD  ?ondansetron (ZOFRAN ODT) 4 MG disintegrating tablet Take 1 tablet (4 mg total) by mouth every 8 (eight) hours as needed for nausea or vomiting. ?Patient not taking: Reported on 11/15/2020 06/29/19   Suzy Bouchard, PA-C  ?cetirizine (ZYRTEC) 10 MG tablet Take 10 mg by mouth daily as needed. For cold symptoms  04/25/11  [provider]  ?   ? ?Allergies    ?Apple juice, Cherry, Ibuprofen, Latex, Norethindrone, and Other   ? ?Review of Systems   ?Review of Systems  ?Respiratory:  Positive for cough.   ?Genitourinary:  Positive for vaginal discharge.  ?All other systems reviewed and are negative. ? ?Physical Exam ?Updated Vital Signs ?BP 138/89  Pulse 80   Temp 98.2 ?F (36.8 ?C) (Oral)   Resp 20   Ht '5\' 7"'$  (1.702 m)   Wt 107 kg   SpO2 99%   BMI 36.96 kg/m?  ?Physical Exam ?Vitals and nursing note reviewed.  ?Constitutional:   ?   Appearance: Normal appearance.  ?HENT:  ?   Head: Normocephalic.  ?   Nose: Nose normal.  ?   Mouth/Throat:  ?   Mouth: Mucous membranes are moist.  ?Eyes:  ?   Extraocular Movements: Extraocular movements intact.  ?   Pupils: Pupils are equal, round, and reactive to light.  ?Cardiovascular:  ?   Rate and Rhythm: Normal rate and regular rhythm.  ?   Pulses: Normal pulses.  ?   Heart sounds: Normal  heart sounds.  ?Pulmonary:  ?   Effort: Pulmonary effort is normal.  ?   Breath sounds: Normal breath sounds.  ?Abdominal:  ?   General: Abdomen is flat.  ?   Palpations: Abdomen is soft.  ?Musculoskeletal:     ?   General: Normal range of motion.  ?   Cervical back: Normal range of motion and neck supple.  ?Skin: ?   General: Skin is warm.  ?   Capillary Refill: Capillary refill takes less than 2 seconds.  ?Neurological:  ?   General: No focal deficit present.  ?   Mental Status: She is alert and oriented to person, place, and time.  ?Psychiatric:     ?   Mood and Affect: Mood normal.     ?   Behavior: Behavior normal.  ? ? ?ED Results / Procedures / Treatments   ?Labs ?(all labs ordered are listed, but only abnormal results are displayed) ?Labs Reviewed  ?WET PREP, GENITAL - Abnormal; Notable for the following components:  ?    Result Value  ? Clue Cells Wet Prep HPF POC PRESENT (*)   ? All other components within normal limits  ?CBC WITH DIFFERENTIAL/PLATELET  ?URINALYSIS, ROUTINE W REFLEX MICROSCOPIC  ?I-STAT BETA HCG BLOOD, ED ([facility], [facility], [facility] ONLY)  ?I-STAT CHEM 8, ED  ?GC/CHLAMYDIA PROBE AMP () NOT AT [facility]  ? ? ?EKG ?None ? ?Radiology ?DG Chest Port 1 View ? ?Result Date: 04/15/2021 ?CLINICAL DATA:  Cough EXAM: PORTABLE CHEST 1 VIEW COMPARISON:  CT chest 02/05/2013 FINDINGS: The heart size and mediastinal contours are within normal limits. Both lungs are clear. The visualized skeletal structures are unremarkable. IMPRESSION: No active disease. Electronically Signed   By: Lucienne Capers M.D.   On: 04/15/2021 22:33   ? ?Procedures ?Procedures  ? ? ?Medications Ordered in ED ?Medications  ?sodium chloride 0.9 % bolus 1,000 mL (1,000 mLs Intravenous New Bag/Given 04/15/21 2230)  ?cefTRIAXone (ROCEPHIN) injection 500 mg (500 mg Intramuscular Given 04/15/21 2231)  ?doxycycline (VIBRA-TABS) tablet 100 mg (100 mg Oral Given 04/15/21 2231)  ?sterile water (preservative free) injection (10 mLs  Given 04/15/21 2231)   ? ? ?ED Course/ Medical Decision Making/ A&P ?  ?                        ?Medical Decision Making ?Amy Dougherty is a 26 y.o. female here presenting with possible STD exposure.  Patient has vaginal discharge.  Patient also has cough with blood-tinged sputum.  Plan to get CBC and chest x-ray.  Patient states that she will self swab and wants empiric treatment for STD ? ?11:17 PM ?Wet prep showed clue cells.  WBC is normal.  Chest x-ray is clear.  Patient received Rocephin and Flagyl and doxycycline.  Will discharge patient home with Flagyl and doxycycline.  ? ? ?Problems Addressed: ?BV (bacterial vaginosis): acute illness or injury ?STD exposure: acute illness or injury ? ?Amount and/or Complexity of Data Reviewed ?External Data Reviewed: notes. ?Labs: ordered. Decision-making details documented in ED Course. ?Radiology: ordered and independent interpretation performed. Decision-making details documented in ED Course. ? ?Risk ?Prescription drug management. ? ? ?Final Clinical Impression(s) / ED Diagnoses ?Final diagnoses:  ?None  ? ? ?Rx / DC Orders ?ED Discharge Orders   ? ? None  ? ?  ? ? ?  ?Drenda Freeze, MD ?04/15/21 2319 ? ?

## 2021-04-15 NOTE — ED Notes (Signed)
Iv removed at discharge

## 2021-04-15 NOTE — Discharge Instructions (Addendum)
Take doxycycline twice daily for a week for possible chlamydia ? ?You were treated for gonorrhea ? ?Take Flagyl twice daily to treat for bacterial vaginosis ? ?You will get results in [place] regarding your gonorrhea chlamydia test.  Your partner may need treatment if you are positive ? ?Please use condoms ? ?See your doctor for follow-up ? ?Return to ER if you have worse cough, vaginal bleeding, fever, abdominal pain ?

## 2021-04-18 LAB — GC/CHLAMYDIA PROBE AMP (~~LOC~~) NOT AT ARMC
Chlamydia: NEGATIVE
Comment: NEGATIVE
Comment: NORMAL
Neisseria Gonorrhea: NEGATIVE

## 2021-10-25 ENCOUNTER — Encounter (HOSPITAL_COMMUNITY): Payer: Self-pay | Admitting: Obstetrics and Gynecology

## 2021-10-25 ENCOUNTER — Other Ambulatory Visit: Payer: Self-pay

## 2021-10-25 ENCOUNTER — Inpatient Hospital Stay (HOSPITAL_COMMUNITY)
Admission: AD | Admit: 2021-10-25 | Discharge: 2021-10-25 | Disposition: A | Discharge status: Home / Self Care | Payer: Medicaid Other | Attending: Obstetrics and Gynecology | Admitting: Obstetrics and Gynecology

## 2021-10-25 DIAGNOSIS — Z3201 Encounter for pregnancy test, result positive: Secondary | ICD-10-CM | POA: Diagnosis present

## 2021-10-25 MED ORDER — PRENATAL PLUS 27-1 MG PO TABS: 1.0000 | ORAL_TABLET | Freq: Every day | ORAL | 5 refills | Status: AC

## 2021-10-25 NOTE — MAU Note (Signed)
Amy Dougherty is a 26 y.o. at Unknown here in MAU reporting: she's here for a check up secondary recently discovering she's pregnant and had a rapid HIV+ test.  Was seen in [place] 10/23/21 & 10/24/21 @ different facilities and instructed to f/u once in [place].  Denies VB or pain. LMP: unsure Onset of complaint: 2 days ago Pain score: 0 Vitals:   10/25/21 0954  BP: (!) 147/94  Pulse: 83  Resp: 19  Temp: 98.3 F (36.8 C)  SpO2: 97%     FHT:N/A Lab orders placed from triage:   None

## 2021-10-25 NOTE — MAU Provider Note (Signed)
Event Date/Time   First Provider Initiated Contact with Patient 10/25/21 0959      S Ms. Amy Dougherty is a 26 y.o. [id number] patient who presents to MAU today requesting checkup and ultrasound. Patient has been to two EDs on 09/17 and 09/18 and states she does not understand what is going on with her body and was instructed to have followup in [place]. She denies physical complaints including abdominal pain, vaginal bleeding, dysuria, abnormal vaginal discharge, fever or recent illness.  Patient states the only medication she is currently taking is a weight loss supplement with which she has lost about sixteen pounds.   Patient has questions regarding a primary cesarean. She states she   O BP (!) 147/94 (BP Location: Right Arm)   Pulse 83   Temp 98.3 F (36.8 C) (Oral)   Resp 19   Ht '5\' 7"'$  (1.702 m)   Wt 100.1 kg   LMP  (LMP Unknown)   SpO2 97%   BMI 34.55 kg/m    Physical Exam Vitals and nursing note reviewed. Exam conducted with a chaperone present.  Constitutional:      Appearance: Normal appearance. She is not ill-appearing.  Neurological:     Mental Status: She is alert.    A Medical screening exam complete Patient with labile mood but overall appropriate behavior during MAU encounter EMR reviewed.  Patient unaware previous ED providers has created framework for follow-up on her behalf Results from ED visits reviewed with patient: -->Presumptive positive Rapid HIV -->Subsequent NR confirmatory HIV resulted this morning.  Apology offered that neither MAU nor office will be able to accommodate same-day ultrasound appointments given patient's current evaluation Discussed my ability to order outpatient viability scan to assist with dating, timing of New OB  P Discharge from MAU in stable condition Patient may return to MAU as needed for pregnancy-related emergencies  F/U: --Patient to wait for phone call from [facility] to schedule viability scan --Patient to start  calling prenatal care providers so she can get on calendar.  --Warned her different locations have different wait lists so she may need to consider locations besides [place] --ED provided Care Coordinator phone number to help patient establish PCP. Encouraged her to call this number ASAP  Darlina Rumpf, CNM 10/25/2021 11:07 AM

## 2021-10-25 NOTE — Discharge Instructions (Addendum)
Safe Medications in Pregnancy    Acne: Benzoyl Peroxide Salicylic Acid  Backache/Headache: Tylenol: 2 regular strength every 4 hours OR              2 Extra strength every 6 hours  Colds/Coughs/Allergies: Benadryl (alcohol free) 25 mg every 6 hours as needed Breath right strips Claritin Cepacol throat lozenges Chloraseptic throat spray Cold-Eeze- up to three times per day Cough drops, alcohol free Flonase (by prescription only) Guaifenesin Mucinex Robitussin DM (plain only, alcohol free) Saline nasal spray/drops Sudafed (pseudoephedrine) & Actifed ** use only after [redacted] weeks gestation and if you do not have high blood pressure Tylenol Vicks Vaporub Zinc lozenges Zyrtec   Constipation: Colace Ducolax suppositories Fleet enema Glycerin suppositories Metamucil Milk of magnesia Miralax Senokot Smooth move tea  Diarrhea: Kaopectate Imodium A-D  *NO pepto Bismol  Hemorrhoids: Anusol Anusol HC Preparation H Tucks  Indigestion: Tums Maalox Mylanta Zantac  Pepcid  Insomnia: Benadryl (alcohol free) 25mg every 6 hours as needed Tylenol PM Unisom, no Gelcaps  Leg Cramps: Tums MagGel  Nausea/Vomiting:  Bonine Dramamine Emetrol Ginger extract Sea bands Meclizine  Nausea medication to take during pregnancy:  Unisom (doxylamine succinate 25 mg tablets) Take one tablet daily at bedtime. If symptoms are not adequately controlled, the dose can be increased to a maximum recommended dose of two tablets daily (1/2 tablet in the morning, 1/2 tablet mid-afternoon and one at bedtime). Vitamin B6 100mg tablets. Take one tablet twice a day (up to 200 mg per day).  Skin Rashes: Aveeno products Benadryl cream or 25mg every 6 hours as needed Calamine Lotion 1% cortisone cream  Yeast infection: Gyne-lotrimin 7 Monistat 7   **If taking multiple medications, please check labels to avoid duplicating the same active ingredients **take  medication as directed on the label ** Do not exceed 4000 mg of tylenol in 24 hours **Do not take medications that contain aspirin or ibuprofen   Prenatal Care Providers           [facility]  [address] [phone]  [facility]   [address]  [phone]  [facility] @ [place]       [address] [phone]            [facility] @ [place]     [address] [phone]          [facility] @ [place]   [address] [phone]  [facility]  [address] [phone]     [facility]  [address]   [phone]     [facility]  Phone: [phone]  [facility]  Phone: [phone]  [facility] Phone: [phone]  [facility] Phone: [phone]  [facility] Phone: [phone]  [facility] Phone: [phone]  [facility]  Phone: [phone]  

## 2021-10-28 DIAGNOSIS — F3162 Bipolar disorder, current episode mixed, moderate: Secondary | ICD-10-CM | POA: Insufficient documentation

## 2021-10-28 DIAGNOSIS — K5900 Constipation, unspecified: Secondary | ICD-10-CM | POA: Insufficient documentation

## 2021-12-11 ENCOUNTER — Inpatient Hospital Stay (HOSPITAL_COMMUNITY)
Admission: AD | Admit: 2021-12-11 | Discharge: 2021-12-12 | Disposition: A | Discharge status: Home / Self Care | Payer: Medicaid Other | Attending: Obstetrics & Gynecology | Admitting: Obstetrics & Gynecology

## 2021-12-11 DIAGNOSIS — Z86718 Personal history of other venous thrombosis and embolism: Secondary | ICD-10-CM | POA: Insufficient documentation

## 2021-12-11 DIAGNOSIS — O210 Mild hyperemesis gravidarum: Secondary | ICD-10-CM | POA: Insufficient documentation

## 2021-12-11 DIAGNOSIS — R42 Dizziness and giddiness: Secondary | ICD-10-CM | POA: Diagnosis not present

## 2021-12-11 DIAGNOSIS — B9689 Other specified bacterial agents as the cause of diseases classified elsewhere: Secondary | ICD-10-CM | POA: Diagnosis not present

## 2021-12-11 DIAGNOSIS — Z3A12 12 weeks gestation of pregnancy: Secondary | ICD-10-CM | POA: Diagnosis not present

## 2021-12-11 DIAGNOSIS — N760 Acute vaginitis: Secondary | ICD-10-CM | POA: Diagnosis not present

## 2021-12-11 DIAGNOSIS — Z5901 Sheltered homelessness: Secondary | ICD-10-CM | POA: Insufficient documentation

## 2021-12-11 DIAGNOSIS — O26891 Other specified pregnancy related conditions, first trimester: Secondary | ICD-10-CM | POA: Diagnosis present

## 2021-12-11 DIAGNOSIS — O23591 Infection of other part of genital tract in pregnancy, first trimester: Secondary | ICD-10-CM | POA: Insufficient documentation

## 2021-12-12 ENCOUNTER — Encounter (HOSPITAL_COMMUNITY): Payer: Self-pay | Admitting: Emergency Medicine

## 2021-12-12 ENCOUNTER — Other Ambulatory Visit: Payer: Self-pay

## 2021-12-12 DIAGNOSIS — B9689 Other specified bacterial agents as the cause of diseases classified elsewhere: Secondary | ICD-10-CM | POA: Diagnosis not present

## 2021-12-12 DIAGNOSIS — N760 Acute vaginitis: Secondary | ICD-10-CM | POA: Diagnosis not present

## 2021-12-12 DIAGNOSIS — O210 Mild hyperemesis gravidarum: Secondary | ICD-10-CM

## 2021-12-12 DIAGNOSIS — Z3A12 12 weeks gestation of pregnancy: Secondary | ICD-10-CM | POA: Diagnosis not present

## 2021-12-12 LAB — GC/CHLAMYDIA PROBE AMP (~~LOC~~) NOT AT ARMC
Chlamydia: NEGATIVE
Comment: NEGATIVE
Comment: NORMAL
Neisseria Gonorrhea: NEGATIVE

## 2021-12-12 LAB — URINALYSIS, ROUTINE W REFLEX MICROSCOPIC
Bilirubin Urine: NEGATIVE
Glucose, UA: NEGATIVE mg/dL
Hgb urine dipstick: NEGATIVE
Ketones, ur: NEGATIVE mg/dL
Leukocytes,Ua: NEGATIVE
Nitrite: NEGATIVE
Protein, ur: NEGATIVE mg/dL
Specific Gravity, Urine: 1.006 (ref 1.005–1.030)
pH: 6 (ref 5.0–8.0)

## 2021-12-12 LAB — BASIC METABOLIC PANEL
Anion gap: 8 (ref 5–15)
BUN: <5 mg/dL — ABNORMAL LOW (ref 6–20)
CO2: 21 mmol/L — ABNORMAL LOW (ref 22–32)
Calcium: 9.1 mg/dL (ref 8.9–10.3)
Chloride: 107 mmol/L (ref 98–111)
Creatinine, Ser: 0.46 mg/dL (ref 0.44–1.00)
GFR, Estimated: >60 mL/min (ref >60)
Glucose, Bld: 107 mg/dL — ABNORMAL HIGH (ref 70–99)
Potassium: 3.5 mmol/L (ref 3.5–5.1)
Sodium: 136 mmol/L (ref 135–145)

## 2021-12-12 LAB — CBC
HCT: 33.8 % — ABNORMAL LOW (ref 36.0–46.0)
Hemoglobin: 11.2 g/dL — ABNORMAL LOW (ref 12.0–15.0)
MCH: 26.5 pg (ref 26.0–34.0)
MCHC: 33.1 g/dL (ref 30.0–36.0)
MCV: 79.9 fL — ABNORMAL LOW (ref 80.0–100.0)
Platelets: 251 10*3/uL (ref 150–400)
RBC: 4.23 MIL/uL (ref 3.87–5.11)
RDW: 14.8 % (ref 11.5–15.5)
WBC: 10.1 10*3/uL (ref 4.0–10.5)
nRBC: 0 % (ref 0.0–0.2)

## 2021-12-12 LAB — WET PREP, GENITAL
Sperm: NONE SEEN
Trich, Wet Prep: NONE SEEN
WBC, Wet Prep HPF POC: <10 (ref <10)
Yeast Wet Prep HPF POC: NONE SEEN

## 2021-12-12 LAB — POCT PREGNANCY, URINE: Preg Test, Ur: POSITIVE — AB

## 2021-12-12 MED ORDER — METOCLOPRAMIDE HCL 10 MG PO TABS
10.0000 mg | ORAL_TABLET | Freq: Four times a day (QID) | ORAL | Status: DC | PRN
  Administered 2021-12-12: 10 mg via ORAL
  Filled 2021-12-12: qty 1

## 2021-12-12 MED ORDER — METRONIDAZOLE 500 MG PO TABS: 500.0000 mg | ORAL_TABLET | Freq: Two times a day (BID) | ORAL | 0 refills | Status: AC

## 2021-12-12 MED ORDER — METOCLOPRAMIDE HCL 10 MG PO TABS: 10.0000 mg | ORAL_TABLET | Freq: Four times a day (QID) | ORAL | 0 refills | Status: AC | PRN

## 2021-12-12 NOTE — Progress Notes (Signed)
Pt reports she does not have a ride and is staying at the [facility]. RN requested taxi voucher for pt from [facility]. Taxi voucher given to RN to fill out. [place] called - will be about 30 minutes for taxi availability.   Pt's discharged instructions have been reviewed and pt verbalized understanding.

## 2021-12-12 NOTE — Progress Notes (Signed)
[facility] arrived to pick up pt. RN handed taxi voucher to driver - copy given to [place].

## 2021-12-12 NOTE — Discharge Instructions (Signed)
[place] for [employer] at [facility]             [address] [place], [place] [address] [place] for [employer] at [place], [address], [place], [place], [address] [phone]  Center for [facility] at [place] [address], [place], [place], [place], [address] [phone]  Center for [facility] at [facility] [address] [place], [place], [place], [address] [phone]  [facility] for [place] at [facility]                                 [place], [place], [place], [address] [phone]  Center for [place] at [facility]                                    [address] [place], [place], [address] [place] for [place] at [facility] [address] [place], [place], [place], [address]                              [facility] [place], [place], [place], [place], [address] [phone]  [facility]         Phone: [phone]  [facility]      Phone: [facility]      Phone: [place] [facility] Planning         Phone: [phone]   [employer] [facility]-M[facility]    Phone: [place]      Phone: [phone]  [facility] [place]     Phone: [phone]  [facility]        Phone: [phone]  [facility] ([place]) [phone]  [facility]      Phone: [phone]                        Safe Medications in Pregnancy    Acne: Benzoyl Peroxide Salicylic Acid  Backache/Headache: Tylenol: 2 regular strength every 4 hours OR               2 Extra strength every 6 hours  Colds/Coughs/Allergies: Benadryl (alcohol free) 25 mg every 6 hours as needed Breath right strips Claritin Cepacol throat lozenges Chloraseptic throat spray Cold-Eeze- up to three times per day Cough drops, alcohol free Flonase (by prescription only) Guaifenesin Mucinex Robitussin DM (plain only, alcohol free) Saline nasal spray/drops Sudafed (pseudoephedrine) & Actifed ** use only after [redacted] weeks gestation and if you do not have high blood pressure Tylenol Vicks Vaporub  Zinc lozenges Zyrtec   Constipation: Colace Ducolax suppositories Fleet enema Glycerin suppositories Metamucil Milk of magnesia Miralax Senokot Smooth move tea  Diarrhea: Kaopectate Imodium A-D  *NO pepto Bismol  Hemorrhoids: Anusol Anusol HC Preparation H Tucks  Indigestion: Tums Maalox Mylanta Zantac  Pepcid  Insomnia: Benadryl (alcohol free) '25mg'$  every 6 hours as needed Tylenol PM Unisom, no Gelcaps  Leg Cramps: Tums MagGel  Nausea/Vomiting:  Bonine Dramamine Emetrol Ginger extract Sea bands Meclizine  Nausea medication to take during pregnancy:  Unisom (doxylamine succinate 25 mg tablets) Take one tablet daily at bedtime. If symptoms are not adequately controlled, the dose can be increased to a maximum recommended dose of two tablets daily (1/2 tablet in the morning, 1/2 tablet mid-afternoon and one at bedtime). Vitamin B6 '100mg'$  tablets. Take one tablet twice a day (up to 200 mg per day).  Skin Rashes: Aveeno products Benadryl cream or '25mg'$  every 6 hours as needed Calamine Lotion 1% cortisone cream  Yeast infection: Gyne-lotrimin 7 Monistat 7   **If taking multiple medications, please check labels to avoid duplicating the same active ingredients **take medication as directed on the label ** Do not exceed 4000 mg of tylenol in 24 hours **Do not take medications that contain aspirin or ibuprofen

## 2021-12-12 NOTE — MAU Provider Note (Signed)
History     CSN: [id number]  Arrival date and time: 12/11/21 2357   Event Date/Time   First Provider Initiated Contact with Patient 12/12/21 0301      Chief Complaint  Patient presents with   Dizziness   26 y.o. [id number] '@12'$ .2 wks by LMP transferred from [facility] presenting with dizziness and vaginal discharge. Reports onset of sx about 5 hours ago. Denies syncope. Reports malodorous mucous vaginal discharge. Denies spotting or VB. Denies urinary sx. Also reports feeling like something was pushing down in her vagina while she was having a BM earlier. 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 She was prescribed Lovenox about 2 weeks ago but has not picked it up yet. States she is living at a hotel and "there's some things going on there", but did not elaborate.   OB History     Gravida  5   Para  2   Term  2   Preterm  0   AB  1   Living  2      SAB  1   IAB  0   Ectopic  0   Multiple  0   Live Births  2           Past Medical History:  Diagnosis Date   Herpes genitalis in women    currently taking medications   History of blood clots 2012   ovarian vein thrombosis, postpartum   Wisdom teeth extracted     Past Surgical History:  Procedure Laterality Date   WISDOM TOOTH EXTRACTION      Family History  Problem Relation Age of Onset   Asthma Brother     Social History   Tobacco Use   Smoking status: Never   Smokeless tobacco: Never  Vaping Use   Vaping Use: Some days  Substance Use Topics   Alcohol use: Yes   Drug use: No    Allergies:  Allergies  Allergen Reactions   Cherry Itching   Ibuprofen Other (See Comments)    Blood thinner medications   Latex     infection   Norethindrone Other (See Comments)    headaches   Other Itching    Tree nuts    Medications Prior to Admission  Medication Sig Dispense Refill Last Dose   prenatal vitamin w/FE, FA (PRENATAL 1 + 1) 27-1 MG TABS tablet Take 1 tablet by mouth  daily at 12 noon. 30 tablet 5 12/11/2021   acetaminophen (TYLENOL) 500 MG tablet Take 500 mg by mouth every 6 (six) hours as needed for mild pain or headache.        Review of Systems  Gastrointestinal:  Positive for nausea. Negative for abdominal pain and vomiting.  Genitourinary:  Positive for vaginal discharge. Negative for dysuria and vaginal bleeding.  Neurological:  Positive for dizziness. Negative for syncope.   Physical Exam   Blood pressure (!) 151/89, pulse 80, temperature 98.1 F (36.7 C), temperature source Oral, resp. rate 17, height '5\' 7"'$  (1.702 m), weight 97.5 kg, SpO2 99 %, unknown if currently breastfeeding.  Physical Exam Vitals and nursing note reviewed. Exam conducted with a chaperone present.  Constitutional:      General: She is not in acute distress.    Appearance: Normal appearance.  HENT:     Head: Normocephalic and atraumatic.  Cardiovascular:     Rate and Rhythm: Normal rate.  Pulmonary:     Effort: Pulmonary effort is normal. No respiratory distress.  Abdominal:     General: There is no distension.     Palpations: Abdomen is soft. There is no mass.     Tenderness: There is no abdominal tenderness. There is no guarding or rebound.     Hernia: No hernia is present.  Genitourinary:    Comments: External: no lesions or erythema Uterus: + enlarged, anteverted, non tender, no CMT Adnexae: no masses, no tenderness left, no tenderness right Cervix closed/long   Musculoskeletal:        General: Normal range of motion.     Cervical back: Normal range of motion.  Skin:    General: Skin is warm and dry.  Neurological:     General: No focal deficit present.     Mental Status: She is alert and oriented to person, place, and time.  Psychiatric:        Mood and Affect: Mood normal.        Behavior: Behavior normal.    Results for orders placed or performed during the hospital encounter of 12/11/21 (from the past 24 hour(s))  Urinalysis, Routine w reflex  microscopic Urine, Clean Catch     Status: None   Collection Time: 12/12/21  2:40 AM  Result Value Ref Range   Color, Urine YELLOW YELLOW   APPearance CLEAR CLEAR   Specific Gravity, Urine 1.006 1.005 - 1.030   pH 6.0 5.0 - 8.0   Glucose, UA NEGATIVE NEGATIVE mg/dL   Hgb urine dipstick NEGATIVE NEGATIVE   Bilirubin Urine NEGATIVE NEGATIVE   Ketones, ur NEGATIVE NEGATIVE mg/dL   Protein, ur NEGATIVE NEGATIVE mg/dL   Nitrite NEGATIVE NEGATIVE   Leukocytes,Ua NEGATIVE NEGATIVE  Wet prep, genital     Status: Abnormal   Collection Time: 12/12/21  3:11 AM  Result Value Ref Range   Yeast Wet Prep HPF POC NONE SEEN NONE SEEN   Trich, Wet Prep NONE SEEN NONE SEEN   Clue Cells Wet Prep HPF POC PRESENT (A) NONE SEEN   WBC, Wet Prep HPF POC <10 <10   Sperm NONE SEEN   CBC     Status: Abnormal   Collection Time: 12/12/21  3:46 AM  Result Value Ref Range   WBC 10.1 4.0 - 10.5 K/uL   RBC 4.23 3.87 - 5.11 MIL/uL   Hemoglobin 11.2 (L) 12.0 - 15.0 g/dL   HCT 33.8 (L) 36.0 - 46.0 %   MCV 79.9 (L) 80.0 - 100.0 fL   MCH 26.5 26.0 - 34.0 pg   MCHC 33.1 30.0 - 36.0 g/dL   RDW 14.8 11.5 - 15.5 %   Platelets 251 150 - 400 K/uL   nRBC 0.0 0.0 - 0.2 %  Basic metabolic panel     Status: Abnormal   Collection Time: 12/12/21  3:46 AM  Result Value Ref Range   Sodium 136 135 - 145 mmol/L   Potassium 3.5 3.5 - 5.1 mmol/L   Chloride 107 98 - 111 mmol/L   CO2 21 (L) 22 - 32 mmol/L   Glucose, Bld 107 (H) 70 - 99 mg/dL   BUN <5 (L) 6 - 20 mg/dL   Creatinine, Ser 0.46 0.44 - 1.00 mg/dL   Calcium 9.1 8.9 - 10.3 mg/dL   GFR, Estimated >60 >60 mL/min   Anion gap 8 5 - 15    MAU Course  Procedures Reglan  MDM Labs ordered and reviewed. Feeling better, no further vomiting, tolerating po. Encouraged pt to pick up Lovenox and get started. Will treat BV. Stable for  discharge home.   Assessment and Plan   1. [redacted] weeks gestation of pregnancy   2. Bacterial vaginosis   3. Morning sickness    Discharge  home Follow up with OB provider to start care-list provided Rx Reglan Rx Flagyl Return precautions  Allergies as of 12/12/2021       Reactions   Cherry Itching   Ibuprofen Other (See Comments)   Blood thinner medications   Latex    infection   Norethindrone Other (See Comments)   headaches   Other Itching   Tree nuts        Medication List     TAKE these medications    acetaminophen 500 MG tablet Commonly known as: TYLENOL Take 500 mg by mouth every 6 (six) hours as needed for mild pain or headache.   metoCLOPramide 10 MG tablet Commonly known as: REGLAN Take 1 tablet (10 mg total) by mouth every 6 (six) hours as needed for nausea or vomiting.   metroNIDAZOLE 500 MG tablet Commonly known as: FLAGYL Take 1 tablet (500 mg total) by mouth 2 (two) times daily.   prenatal vitamin w/FE, FA 27-1 MG Tabs tablet Take 1 tablet by mouth daily at 12 noon.        Julianne Handler, CNM 12/12/2021, 4:42 AM

## 2021-12-12 NOTE — ED Triage Notes (Addendum)
Pt is [redacted] weeks pregnant, c/o dizziness, and pre-eclampsias and is not taking her medication.   160/104 HR 100 RR 16 98% RA CBG 140  Pt states she is [redacted] weeks pregnant but has not seen a provider yet.  Pt states she has been very stressed out and has felt that she has felt like passing out.  This is the pt's 4th pregnancy, she reports she had pre-eclampsia.  Pt is a poor historian and it is difficult to follow her story.    Pt denies bleeding, or abdominal pain at this time.  She reports some discharge.

## 2021-12-12 NOTE — ED Provider Triage Note (Signed)
Emergency Medicine Provider Triage Evaluation Note  Amy Dougherty , a 26 y.o. female  was evaluated in triage.  Pt complains of headache and lightheadedness in context of pregnancy and approximate week gestation.  Patient with bizarre behavior, difficult to obtain history.  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  States she is now picked up any of her medications.  Review of Systems  Positive: As above vaginal discharge Negative: Abdominal pain, vaginal bleeding.  Physical Exam  BP (!) 141/84   Pulse 82   Temp 98.1 F (36.7 C) (Oral)   Resp 18   Ht '5\' 7"'$  (1.702 m)   Wt 100.1 kg   LMP  (LMP Unknown)   SpO2 99%   BMI 34.56 kg/m  Gen:   Awake, no distress   Resp:  Normal effort  MSK:   Moves extremities without difficulty  Other:  RRR no M/R/G.  Lungs CTA B.  Abdomen soft nondistended nontender  Medical Decision Making  Medically screening exam initiated at 12:54 AM.  Appropriate orders placed.  Amy Dougherty was informed that the remainder of the evaluation will be completed by another provider, this initial triage assessment does not replace that evaluation, and the importance of remaining in the ED until their evaluation is complete.  Case discussed with MAU APP Amy Dougherty who is agreeable to receiving this patient in the MAU for concerns of headache and lightheadedness in context of BP at 141/84 as well as other complicating factors of this current pregnancy.  I appreciate her collaboration in care of this patient.  MAU RN and transport both notified by this provider.  This chart was dictated using voice recognition software, Dragon. Despite the best efforts of this provider to proofread and correct errors, errors may still occur which can change documentation meaning.    Amy Darling, PA-C 12/12/21  [phone]

## 2021-12-12 NOTE — MAU Note (Signed)
.  Amy Dougherty is a 26 y.o. at Unknown here in MAU reporting: here from main ED. Reports HA, N/V, lightheaded, and vaginal pressure. Has not started prenatal care yet. Reports had [place] a month ago and was [redacted] weeks pregnant.  LMP: July?  Onset of complaint: 2300 Pain score: 5 Vitals:   12/12/21 0049 12/12/21 0216  BP: (!) 141/84 (!) 151/86  Pulse: 82 89  Resp: 18 17  Temp: 98.1 F (36.7 C) 98.1 F (36.7 C)  SpO2: 99% 99%     FHT:165 Lab orders placed from triage:  UA

## 2022-01-22 ENCOUNTER — Encounter (HOSPITAL_COMMUNITY): Payer: Self-pay | Admitting: Obstetrics & Gynecology

## 2022-01-22 ENCOUNTER — Inpatient Hospital Stay (HOSPITAL_COMMUNITY)
Admission: AD | Admit: 2022-01-22 | Discharge: 2022-01-22 | Disposition: A | Discharge status: Home / Self Care | Payer: Medicaid Other | Attending: Obstetrics & Gynecology | Admitting: Obstetrics & Gynecology

## 2022-01-22 DIAGNOSIS — R1033 Periumbilical pain: Secondary | ICD-10-CM | POA: Insufficient documentation

## 2022-01-22 DIAGNOSIS — O219 Vomiting of pregnancy, unspecified: Secondary | ICD-10-CM | POA: Diagnosis not present

## 2022-01-22 DIAGNOSIS — R112 Nausea with vomiting, unspecified: Secondary | ICD-10-CM

## 2022-01-22 DIAGNOSIS — R109 Unspecified abdominal pain: Secondary | ICD-10-CM

## 2022-01-22 DIAGNOSIS — O26892 Other specified pregnancy related conditions, second trimester: Secondary | ICD-10-CM | POA: Insufficient documentation

## 2022-01-22 DIAGNOSIS — Z3A18 18 weeks gestation of pregnancy: Secondary | ICD-10-CM | POA: Diagnosis not present

## 2022-01-22 DIAGNOSIS — R42 Dizziness and giddiness: Secondary | ICD-10-CM

## 2022-01-22 LAB — URINALYSIS, ROUTINE W REFLEX MICROSCOPIC
Bilirubin Urine: NEGATIVE
Glucose, UA: NEGATIVE mg/dL
Hgb urine dipstick: NEGATIVE
Ketones, ur: NEGATIVE mg/dL
Leukocytes,Ua: NEGATIVE
Nitrite: NEGATIVE
Protein, ur: NEGATIVE mg/dL
Specific Gravity, Urine: 1.024 (ref 1.005–1.030)
pH: 5 (ref 5.0–8.0)

## 2022-01-22 LAB — CBC
HCT: 32.2 % — ABNORMAL LOW (ref 36.0–46.0)
Hemoglobin: 10.5 g/dL — ABNORMAL LOW (ref 12.0–15.0)
MCH: 26.6 pg (ref 26.0–34.0)
MCHC: 32.6 g/dL (ref 30.0–36.0)
MCV: 81.7 fL (ref 80.0–100.0)
Platelets: 239 10*3/uL (ref 150–400)
RBC: 3.94 MIL/uL (ref 3.87–5.11)
RDW: 15 % (ref 11.5–15.5)
WBC: 9.3 10*3/uL (ref 4.0–10.5)
nRBC: 0 % (ref 0.0–0.2)

## 2022-01-22 MED ORDER — PRENATAL VITAMIN 27-0.8 MG PO TABS: 1.0000 | ORAL_TABLET | Freq: Every day | ORAL | 12 refills | Status: DC

## 2022-01-22 MED ORDER — ONDANSETRON 4 MG PO TBDP: 4.0000 mg | ORAL_TABLET | Freq: Four times a day (QID) | ORAL | 0 refills | Status: AC | PRN

## 2022-01-22 NOTE — MAU Note (Signed)
Hansel Feinstein CNM in to do bedside u/s

## 2022-01-22 NOTE — MAU Note (Addendum)
.  Amy Dougherty is a 26 y.o. at 46w1dhere in MAU reporting cramping at mid abdomen for about 3 hrs. Denies VB. Dizziness for last 2 days. States has been eating and drinking like usual. [facility] Pt states sometimes feels lightheaded and dzzy like she may faint. If she sits few mins it passes and she feels better  Onset of complaint: 2 days for dizziness and 3 hrs for cramping Pain score: 7 Vitals:   01/22/22 0012 01/22/22 0017  BP:  134/68  Pulse: 73   Resp: 17   Temp: 98.3 F (36.8 C)   SpO2: 100%      FHT:154 Lab orders placed from triage:  u/a

## 2022-01-22 NOTE — Progress Notes (Signed)
Written and verbal d/c instructions given and understanding voiced. Understands someone from [facility] office will call her with an appt at their office

## 2022-01-22 NOTE — MAU Provider Note (Signed)
Chief Complaint:  Abdominal Pain and Dizziness   Event Date/Time   First Provider Initiated Contact with Patient 01/22/22 0030     Abdominal Pain This is a new problem. The current episode started today. The onset quality is gradual. The problem occurs intermittently. The pain is located in the periumbilical region. The pain is mild. The quality of the pain is colicky and cramping. The abdominal pain does not radiate. Associated symptoms include nausea and vomiting. Pertinent negatives include no anorexia, constipation, diarrhea, dysuria, fever, frequency or myalgias. Nothing aggravates the pain. The pain is relieved by Nothing. She has tried nothing for the symptoms.  Dizziness This is a new problem. The current episode started yesterday. The problem has been waxing and waning. Associated symptoms include abdominal pain, nausea and vomiting. Pertinent negatives include no anorexia, chills, congestion, fever, myalgias, sore throat or weakness. Nothing aggravates the symptoms. She has tried nothing for the symptoms.    HPI: Amy Dougherty is a 26 y.o. [id number] at 51w1dho presents to maternity admissions reporting periumbilical pain which comes and goes over past few hours.  Also has some intermittent dizziness.  Can't really say if it is lightheadedness or vertigo-like.  Has had some nausea. . She reports good fetal movement, denies LOF, vaginal bleeding, vaginal itching/burning, urinary symptoms, h/a, diarrhea, constipation or fever/chills.    RN Note: DTERENCE GOOGEis a 26y.o. at 138w1dere in MAU reporting cramping at mid abdomen for about 3 hrs. Denies VB. Dizziness for last 2 days. States has been eating and drinking like usual. [facility]. Pt states sometimes feels lightheaded and dzzy like she may faint. If she sits few mins it passes and she feels better  Onset of complaint: 2 days for dizziness and 3 hrs for cramping Pain score: 7  Past Medical History: Past Medical  History:  Diagnosis Date   Herpes genitalis in women    currently taking medications   History of blood clots 2012   ovarian vein thrombosis, postpartum   Wisdom teeth extracted     Past obstetric history: OB History  Gravida Para Term Preterm AB Living  '5 2 2 '$ 0 1 2  SAB IAB Ectopic Multiple Live Births  0 1 0 0 2    # Outcome Date GA Lbr Len/2nd Weight Sex Delivery Anes PTL Lv  5 Current           4 IAB 07/09/16 1034w5d      3 Term 10/14/12 [id number] / 00:03 2825 g M Vag-Spont None  LIV  2 Term 02/18/10    M Vag-Spont EPI  LIV  1 Gravida             Past Surgical History: Past Surgical History:  Procedure Laterality Date   NO PAST SURGERIES     WISDOM TOOTH EXTRACTION      Family History: Family History  Problem Relation Age of Onset   Asthma Brother     Social History: Social History   Tobacco Use   Smoking status: Never   Smokeless tobacco: Never  Vaping Use   Vaping Use: Some days  Substance Use Topics   Alcohol use: Yes   Drug use: No    Allergies:  Allergies  Allergen Reactions   Cherry Itching   Ibuprofen Other (See Comments)    Blood thinner medications   Latex     infection   Norethindrone Other (See Comments)    headaches  Other Itching    Tree nuts    Meds:  Medications Prior to Admission  Medication Sig Dispense Refill Last Dose   aspirin EC 81 MG tablet Take 81 mg by mouth daily. Swallow whole.   01/21/2022   metoCLOPramide (REGLAN) 10 MG tablet Take 1 tablet (10 mg total) by mouth every 6 (six) hours as needed for nausea or vomiting. 30 tablet 0 Past Week   prenatal vitamin w/FE, FA (PRENATAL 1 + 1) 27-1 MG TABS tablet Take 1 tablet by mouth daily at 12 noon. 30 tablet 5 01/21/2022   acetaminophen (TYLENOL) 500 MG tablet Take 500 mg by mouth every 6 (six) hours as needed for mild pain or headache.       metroNIDAZOLE (FLAGYL) 500 MG tablet Take 1 tablet (500 mg total) by mouth 2 (two) times daily. 14 tablet 0     I have  reviewed patient's Past Medical Hx, Surgical Hx, Family Hx, Social Hx, medications and allergies.   ROS:  Review of Systems  Constitutional:  Negative for chills and fever.  HENT:  Negative for congestion and sore throat.   Gastrointestinal:  Positive for abdominal pain, nausea and vomiting. Negative for anorexia, constipation and diarrhea.  Genitourinary:  Negative for dysuria and frequency.  Musculoskeletal:  Negative for myalgias.  Neurological:  Positive for dizziness. Negative for weakness.   Other systems negative  Physical Exam  Patient Vitals for the past 24 hrs:  BP Temp Pulse Resp SpO2 Height Weight  01/22/22 0017 134/68 -- -- -- -- -- --  01/22/22 0012 -- 98.3 F (36.8 C) 73 17 100 % '5\' 7"'$  (1.702 m) 99.8 kg   Constitutional: Well-developed, well-nourished female in no acute distress.  Cardiovascular: normal rate and rhythm Respiratory: normal effort, clear to auscultation bilaterally GI: Abd soft, non-tender, gravid appropriate for gestational age.   No rebound or guarding. MS: Extremities nontender, no edema, normal ROM Neurologic: Alert and oriented x 4.  GU: Neg CVAT.  PELVIC EXAM: deferred since pain is periumbilical   FHT:  937J   Labs: Results for orders placed or performed during the hospital encounter of 01/22/22 (from the past 24 hour(s))  Urinalysis, Routine w reflex microscopic Urine, Clean Catch     Status: Abnormal   Collection Time: 01/22/22 12:38 AM  Result Value Ref Range   Color, Urine YELLOW YELLOW   APPearance CLOUDY (A) CLEAR   Specific Gravity, Urine 1.024 1.005 - 1.030   pH 5.0 5.0 - 8.0   Glucose, UA NEGATIVE NEGATIVE mg/dL   Hgb urine dipstick NEGATIVE NEGATIVE   Bilirubin Urine NEGATIVE NEGATIVE   Ketones, ur NEGATIVE NEGATIVE mg/dL   Protein, ur NEGATIVE NEGATIVE mg/dL   Nitrite NEGATIVE NEGATIVE   Leukocytes,Ua NEGATIVE NEGATIVE  CBC     Status: Abnormal   Collection Time: 01/22/22  1:16 AM  Result Value Ref Range   WBC 9.3 4.0  - 10.5 K/uL   RBC 3.94 3.87 - 5.11 MIL/uL   Hemoglobin 10.5 (L) 12.0 - 15.0 g/dL   HCT 32.2 (L) 36.0 - 46.0 %   MCV 81.7 80.0 - 100.0 fL   MCH 26.6 26.0 - 34.0 pg   MCHC 32.6 30.0 - 36.0 g/dL   RDW 15.0 11.5 - 15.5 %   Platelets 239 150 - 400 K/uL   nRBC 0.0 0.0 - 0.2 %   Imaging:  No results found.  MAU Course/MDM: I have reviewed the triage vital signs and the nursing notes.   Pertinent labs &  imaging results that were available during my care of the patient were reviewed by me and considered in my medical decision making (see chart for details).      I have reviewed her medical records including past results, notes and treatments.   I have ordered labs and reviewed results.  Informal bedside US done for reassurance. Fetus was active and appears grossly normal Patient excited to see it.   Treatments in MAU included Orthostatic vital signs, which were normal.  CBC does not show leukocytosis or anemia.  UA is normal with no signs of dehydration. .    Assessment: Single IUP at 33w1dDizziness, intermittent Periumbilical pain, suspect GI gas pains Intermittent nausea and vomiting  Plan: Discharge home Rx Zofran prn nausea [facility] Follow up in Office for prenatal visits and recheck Encouraged to return if she develops worsening of symptoms, increase in pain, fever, or other concerning symptoms.   Pt stable at time of discharge.  MHansel FeinsteinCNM, MSN Certified Nurse-Midwife 01/22/2022 12:30 AM

## 2022-01-23 ENCOUNTER — Telehealth: Payer: Self-pay

## 2022-02-15 ENCOUNTER — Ambulatory Visit: Payer: Medicaid Other | Admitting: *Deleted

## 2022-02-15 ENCOUNTER — Telehealth: Payer: Self-pay | Admitting: *Deleted

## 2022-02-15 DIAGNOSIS — Z91199 Patient's noncompliance with other medical treatment and regimen due to unspecified reason: Secondary | ICD-10-CM

## 2022-02-15 NOTE — Progress Notes (Signed)
No answer. No callback.

## 2022-02-15 NOTE — Telephone Encounter (Signed)
TC to complete OB Intake. No answer. Left HIPAA compliant VM with call back number.

## 2022-02-24 ENCOUNTER — Telehealth: Payer: Self-pay

## 2022-03-28 ENCOUNTER — Inpatient Hospital Stay (HOSPITAL_COMMUNITY)
Admission: AD | Admit: 2022-03-28 | Discharge: 2022-03-28 | Disposition: A | Discharge status: Home / Self Care | Payer: Medicaid Other | Attending: Family Medicine | Admitting: Family Medicine

## 2022-03-28 ENCOUNTER — Inpatient Hospital Stay (HOSPITAL_BASED_OUTPATIENT_CLINIC_OR_DEPARTMENT_OTHER): Payer: Medicaid Other

## 2022-03-28 ENCOUNTER — Encounter (HOSPITAL_COMMUNITY): Payer: Self-pay | Admitting: Family Medicine

## 2022-03-28 DIAGNOSIS — O26892 Other specified pregnancy related conditions, second trimester: Secondary | ICD-10-CM | POA: Diagnosis not present

## 2022-03-28 DIAGNOSIS — O26899 Other specified pregnancy related conditions, unspecified trimester: Secondary | ICD-10-CM | POA: Diagnosis not present

## 2022-03-28 DIAGNOSIS — R300 Dysuria: Secondary | ICD-10-CM | POA: Insufficient documentation

## 2022-03-28 DIAGNOSIS — R1010 Upper abdominal pain, unspecified: Secondary | ICD-10-CM | POA: Diagnosis present

## 2022-03-28 DIAGNOSIS — Z3A27 27 weeks gestation of pregnancy: Secondary | ICD-10-CM

## 2022-03-28 DIAGNOSIS — R109 Unspecified abdominal pain: Secondary | ICD-10-CM

## 2022-03-28 DIAGNOSIS — R110 Nausea: Secondary | ICD-10-CM | POA: Diagnosis not present

## 2022-03-28 DIAGNOSIS — O0932 Supervision of pregnancy with insufficient antenatal care, second trimester: Secondary | ICD-10-CM

## 2022-03-28 DIAGNOSIS — Z3689 Encounter for other specified antenatal screening: Secondary | ICD-10-CM

## 2022-03-28 LAB — COMPREHENSIVE METABOLIC PANEL
ALT: 11 U/L (ref 0–44)
AST: 17 U/L (ref 15–41)
Albumin: 3 g/dL — ABNORMAL LOW (ref 3.5–5.0)
Alkaline Phosphatase: 59 U/L (ref 38–126)
Anion gap: 13 (ref 5–15)
BUN: <5 mg/dL — ABNORMAL LOW (ref 6–20)
CO2: 17 mmol/L — ABNORMAL LOW (ref 22–32)
Calcium: 8.4 mg/dL — ABNORMAL LOW (ref 8.9–10.3)
Chloride: 105 mmol/L (ref 98–111)
Creatinine, Ser: 0.34 mg/dL — ABNORMAL LOW (ref 0.44–1.00)
GFR, Estimated: >60 mL/min (ref >60)
Glucose, Bld: 98 mg/dL (ref 70–99)
Potassium: 3.2 mmol/L — ABNORMAL LOW (ref 3.5–5.1)
Sodium: 135 mmol/L (ref 135–145)
Total Bilirubin: 0.5 mg/dL (ref 0.3–1.2)
Total Protein: 6.2 g/dL — ABNORMAL LOW (ref 6.5–8.1)

## 2022-03-28 LAB — WET PREP, GENITAL
Clue Cells Wet Prep HPF POC: NONE SEEN
Sperm: NONE SEEN
Trich, Wet Prep: NONE SEEN
WBC, Wet Prep HPF POC: >=10 — AB (ref <10)

## 2022-03-28 LAB — URINALYSIS, ROUTINE W REFLEX MICROSCOPIC
Bilirubin Urine: NEGATIVE
Glucose, UA: 250 mg/dL — AB
Hgb urine dipstick: NEGATIVE
Ketones, ur: NEGATIVE mg/dL
Nitrite: NEGATIVE
Protein, ur: NEGATIVE mg/dL
Specific Gravity, Urine: >1.03 — ABNORMAL HIGH (ref 1.005–1.030)
pH: 6 (ref 5.0–8.0)

## 2022-03-28 LAB — URINALYSIS, MICROSCOPIC (REFLEX)

## 2022-03-28 LAB — GC/CHLAMYDIA PROBE AMP (~~LOC~~) NOT AT ARMC
Chlamydia: NEGATIVE
Comment: NEGATIVE
Comment: NORMAL
Neisseria Gonorrhea: POSITIVE — AB

## 2022-03-28 LAB — CBC
HCT: 33.3 % — ABNORMAL LOW (ref 36.0–46.0)
Hemoglobin: 11.2 g/dL — ABNORMAL LOW (ref 12.0–15.0)
MCH: 27.1 pg (ref 26.0–34.0)
MCHC: 33.6 g/dL (ref 30.0–36.0)
MCV: 80.6 fL (ref 80.0–100.0)
Platelets: 241 10*3/uL (ref 150–400)
RBC: 4.13 MIL/uL (ref 3.87–5.11)
RDW: 13.3 % (ref 11.5–15.5)
WBC: 8.7 10*3/uL (ref 4.0–10.5)
nRBC: 0 % (ref 0.0–0.2)

## 2022-03-28 LAB — HEMOGLOBIN A1C
Hgb A1c MFr Bld: 5.3 % (ref 4.8–5.6)
Mean Plasma Glucose: 105.41 mg/dL

## 2022-03-28 LAB — PROTEIN / CREATININE RATIO, URINE
Creatinine, Urine: 126 mg/dL
Protein Creatinine Ratio: 0.13 mg/mg{Cre} (ref 0.00–0.15)
Total Protein, Urine: 16 mg/dL

## 2022-03-28 LAB — HIV ANTIBODY (ROUTINE TESTING W REFLEX): HIV Screen 4th Generation wRfx: NONREACTIVE

## 2022-03-28 MED ORDER — ACETAMINOPHEN 325 MG PO TABS
650.0000 mg | ORAL_TABLET | Freq: Once | ORAL | Status: AC
  Administered 2022-03-28: 650 mg via ORAL
  Filled 2022-03-28: qty 2

## 2022-03-28 MED ORDER — TERCONAZOLE 0.8 % VA CREA: 1.0000 | TOPICAL_CREAM | Freq: Every day | VAGINAL | 0 refills | Status: AC

## 2022-03-28 MED ORDER — CYCLOBENZAPRINE HCL 5 MG PO TABS
10.0000 mg | ORAL_TABLET | Freq: Once | ORAL | Status: AC
  Administered 2022-03-28: 10 mg via ORAL
  Filled 2022-03-28: qty 2

## 2022-03-28 MED ORDER — CEFADROXIL 500 MG PO CAPS: 500.0000 mg | ORAL_CAPSULE | Freq: Two times a day (BID) | ORAL | 0 refills | Status: AC

## 2022-03-28 MED ORDER — ONDANSETRON 4 MG PO TBDP
8.0000 mg | ORAL_TABLET | Freq: Once | ORAL | Status: AC
  Administered 2022-03-28: 8 mg via ORAL
  Filled 2022-03-28: qty 2

## 2022-03-28 NOTE — MAU Note (Signed)
Pt says she was at a service station at [place]- started having UC's - so she called EMS No PNC- - was going to make an appointment with [facility]- but did not. [facility] been [facility] if needed.

## 2022-03-28 NOTE — MAU Provider Note (Signed)
History     CSN: [id number]  Arrival date and time: 03/28/22 0120   Event Date/Time   First Provider Initiated Contact with Patient 03/28/22 [phone]      Chief Complaint  Patient presents with   Contractions   Amy Dougherty is a 27 y.o. [id number] at 61w3dwho has had limited care.  She presents today, via EMS, for abdominal cramping.  Patient reports she has been experiencing cramping at the top of her stomach that started about 1-2 hours ago.  Patient states the pain lasts 1-2 minutes and has no relieving or worsening factors. Patient rates the pain a 6-7/10. She endorses fetal movement and denies vaginal concerns.  Patient does report some dysuria with urination x 1 week.     OB History     Gravida  7   Para  2   Term  2   Preterm  0   AB  3   Living  3      SAB  3   IAB  0   Ectopic  0   Multiple  0   Live Births  3           Past Medical History:  Diagnosis Date   Herpes genitalis in women    currently taking medications   History of blood clots 2012   ovarian vein thrombosis, postpartum   Wisdom teeth extracted     Past Surgical History:  Procedure Laterality Date   NO PAST SURGERIES     WISDOM TOOTH EXTRACTION      Family History  Problem Relation Age of Onset   Asthma Brother     Social History   Tobacco Use   Smoking status: Never   Smokeless tobacco: Never  Vaping Use   Vaping Use: Some days  Substance Use Topics   Alcohol use: Yes   Drug use: No    Allergies:  Allergies  Allergen Reactions   Cherry Itching   Ibuprofen Other (See Comments)    Blood thinner medications   Latex     infection   Norethindrone Other (See Comments)    headaches   Other Itching    Tree nuts    Medications Prior to Admission  Medication Sig Dispense Refill Last Dose   aspirin EC 81 MG tablet Take 81 mg by mouth daily. Swallow whole.   Past Week   metoCLOPramide (REGLAN) 10 MG tablet Take 1 tablet (10 mg total) by mouth every 6 (six) hours  as needed for nausea or vomiting. 30 tablet 0 Past Month   metroNIDAZOLE (FLAGYL) 500 MG tablet Take 1 tablet (500 mg total) by mouth 2 (two) times daily. 14 tablet 0 Past Month   ondansetron (ZOFRAN-ODT) 4 MG disintegrating tablet Take 1 tablet (4 mg total) by mouth every 6 (six) hours as needed for nausea. 20 tablet 0 Past Month   prenatal vitamin w/FE, FA (PRENATAL 1 + 1) 27-1 MG TABS tablet Take 1 tablet by mouth daily at 12 noon. 30 tablet 5 03/27/2022   acetaminophen (TYLENOL) 500 MG tablet Take 500 mg by mouth every 6 (six) hours as needed for mild pain or headache.        Review of Systems  Gastrointestinal:  Negative for nausea and vomiting.  Genitourinary:  Positive for dysuria. Negative for difficulty urinating, vaginal bleeding and vaginal discharge.  Neurological:  Negative for dizziness, light-headedness and headaches.   Physical Exam   Blood pressure (!) 140/82, pulse 88, temperature 98.1 F (  36.7 C), resp. rate 16, height 5' 8"$  (1.727 m), weight 100.7 kg, unknown if currently breastfeeding. Vitals:   03/28/22 0134 03/28/22 0223 03/28/22 0355  BP: (!) 140/82 138/85 133/75  Pulse: 88 78 75  Resp: 16    Temp: 98.1 F (36.7 C)    Weight: 100.7 kg    Height: 5' 8"$  (1.727 m)      Physical Exam Vitals reviewed.  Constitutional:      Appearance: Normal appearance.  HENT:     Head: Normocephalic and atraumatic.  Eyes:     Conjunctiva/sclera: Conjunctivae normal.  Cardiovascular:     Rate and Rhythm: Normal rate.  Pulmonary:     Effort: Pulmonary effort is normal. No respiratory distress.  Genitourinary:    Comments: NEFG Cultures collected blindly Dilation: Closed Effacement (%): Thick Exam by:: Janett Billow, CNM  Musculoskeletal:        General: Normal range of motion.     Cervical back: Normal range of motion.  Skin:    General: Skin is warm and dry.  Neurological:     Mental Status: She is alert and oriented to person, place, and time.  Psychiatric:         Mood and Affect: Mood normal.        Behavior: Behavior normal.     Fetal Assessment 135 bpm, Mod Var, -Decels, +Accels Toco: No Ctx Graphed MAU Course   Results for orders placed or performed during the hospital encounter of 03/28/22 (from the past 24 hour(s))  Urinalysis, Routine w reflex microscopic -Urine, Clean Catch     Status: Abnormal   Collection Time: 03/28/22  1:45 AM  Result Value Ref Range   Color, Urine YELLOW YELLOW   APPearance HAZY (A) CLEAR   Specific Gravity, Urine >1.030 (H) 1.005 - 1.030   pH 6.0 5.0 - 8.0   Glucose, UA 250 (A) NEGATIVE mg/dL   Hgb urine dipstick NEGATIVE NEGATIVE   Bilirubin Urine NEGATIVE NEGATIVE   Ketones, ur NEGATIVE NEGATIVE mg/dL   Protein, ur NEGATIVE NEGATIVE mg/dL   Nitrite NEGATIVE NEGATIVE   Leukocytes,Ua TRACE (A) NEGATIVE  Urinalysis, Microscopic (reflex)     Status: Abnormal   Collection Time: 03/28/22  1:45 AM  Result Value Ref Range   RBC / HPF 0-5 0 - 5 RBC/hpf   WBC, UA 6-10 0 - 5 WBC/hpf   Bacteria, UA FEW (A) NONE SEEN   Squamous Epithelial / HPF 11-20 0 - 5 /HPF   Mucus PRESENT   Wet prep, genital     Status: Abnormal   Collection Time: 03/28/22  2:25 AM  Result Value Ref Range   Yeast Wet Prep HPF POC PRESENT (A) NONE SEEN   Trich, Wet Prep NONE SEEN NONE SEEN   Clue Cells Wet Prep HPF POC NONE SEEN NONE SEEN   WBC, Wet Prep HPF POC >=10 (A) <10   Sperm NONE SEEN   CBC     Status: Abnormal   Collection Time: 03/28/22  2:36 AM  Result Value Ref Range   WBC 8.7 4.0 - 10.5 K/uL   RBC 4.13 3.87 - 5.11 MIL/uL   Hemoglobin 11.2 (L) 12.0 - 15.0 g/dL   HCT 33.3 (L) 36.0 - 46.0 %   MCV 80.6 80.0 - 100.0 fL   MCH 27.1 26.0 - 34.0 pg   MCHC 33.6 30.0 - 36.0 g/dL   RDW 13.3 11.5 - 15.5 %   Platelets 241 150 - 400 K/uL   nRBC 0.0 0.0 -  0.2 %  Comprehensive metabolic panel     Status: Abnormal   Collection Time: 03/28/22  2:36 AM  Result Value Ref Range   Sodium 135 135 - 145 mmol/L   Potassium 3.2 (L) 3.5 - 5.1  mmol/L   Chloride 105 98 - 111 mmol/L   CO2 17 (L) 22 - 32 mmol/L   Glucose, Bld 98 70 - 99 mg/dL   BUN <5 (L) 6 - 20 mg/dL   Creatinine, Ser 0.34 (L) 0.44 - 1.00 mg/dL   Calcium 8.4 (L) 8.9 - 10.3 mg/dL   Total Protein 6.2 (L) 6.5 - 8.1 g/dL   Albumin 3.0 (L) 3.5 - 5.0 g/dL   AST 17 15 - 41 U/L   ALT 11 0 - 44 U/L   Alkaline Phosphatase 59 38 - 126 U/L   Total Bilirubin 0.5 0.3 - 1.2 mg/dL   GFR, Estimated >60 >60 mL/min   Anion gap 13 5 - 15  Hemoglobin A1c     Status: None   Collection Time: 03/28/22  2:36 AM  Result Value Ref Range   Hgb A1c MFr Bld 5.3 4.8 - 5.6 %   Mean Plasma Glucose 105.41 mg/dL            MDM PE Labs: CBC, HgB A1C, CMP, UA, UC, PCR EFM Cultures: Wet Prep, GC/CT Pain medication Antiemetic Anatomy US Assessment and Plan  27 year old [id number]  SIUP at 27.3 weeks Cat I FT Abdominal Cramping Nausea Limited PNC Dysuria  -POC Reviewed. -Patient request collection of GC/CT reporting potential exposure. 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  -However, unable to locate negative results.  -Exam performed and findings discussed. -Patient r -UA with few bacteria. Will send for culture. -Anatomy US ordered -Patient offered and accepts pain medication.  -Tylenol and flexeril to be given after Zofran. -NST reactive for [place] Camauri Craton MSN, CNM 03/28/2022, 2:12 AM   Reassessment (4:06 AM) -Results as above. -Informed that preliminary [place] without significant findings. EDD remains unchanged.  -Patient aware that fetus is female; expresses excitement. -Discussed yeast findings on wet prep.  Treatment sent to [facility] on [address] [place] per patient request.  -PC Ratio pending, but would not change POC. -Patient instructed to call office and schedule appt. -Precautions given. -Encouraged to return to MAU if symptoms worsen or with the onset of new symptoms. -Discharged to home in stable  condition.  [name] MSN, CNM Advanced Practice Provider, Center for [employer]

## 2022-03-29 LAB — URINE CULTURE: Culture: 20000 — AB

## 2022-03-30 ENCOUNTER — Telehealth (INDEPENDENT_AMBULATORY_CARE_PROVIDER_SITE_OTHER): Payer: Medicaid Other

## 2022-03-30 ENCOUNTER — Telehealth: Payer: Self-pay

## 2022-03-30 DIAGNOSIS — A549 Gonococcal infection, unspecified: Secondary | ICD-10-CM
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
## 2022-03-31 ENCOUNTER — Telehealth: Payer: Self-pay

## 2022-03-31 NOTE — Telephone Encounter (Signed)
Telephone call to patient to schedule MAU follow up appt and to reschedule missed New OB visit.  Patient not in, left message for her to call.

## 2022-04-07 ENCOUNTER — Telehealth: Payer: Self-pay

## 2022-04-12 ENCOUNTER — Telehealth: Payer: Self-pay

## 2022-04-13 ENCOUNTER — Telehealth: Payer: Self-pay

## 2022-12-26 ENCOUNTER — Encounter (HOSPITAL_COMMUNITY): Payer: Self-pay

## 2022-12-26 ENCOUNTER — Other Ambulatory Visit: Payer: Self-pay

## 2022-12-26 ENCOUNTER — Emergency Department (HOSPITAL_COMMUNITY)
Admission: EM | Admit: 2022-12-26 | Discharge: 2022-12-27 | Disposition: A | Discharge status: Home / Self Care | Payer: MEDICAID | Attending: Emergency Medicine | Admitting: Emergency Medicine

## 2022-12-26 DIAGNOSIS — T7421XA Adult sexual abuse, confirmed, initial encounter: Secondary | ICD-10-CM | POA: Diagnosis present

## 2022-12-26 DIAGNOSIS — Z91040 Latex allergy status: Secondary | ICD-10-CM | POA: Diagnosis not present

## 2022-12-26 DIAGNOSIS — Z7982 Long term (current) use of aspirin: Secondary | ICD-10-CM | POA: Insufficient documentation
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
## 2022-12-27 ENCOUNTER — Other Ambulatory Visit (HOSPITAL_COMMUNITY): Payer: Self-pay

## 2022-12-27 LAB — RAPID URINE DRUG SCREEN, HOSP PERFORMED
Amphetamines: NOT DETECTED
Barbiturates: NOT DETECTED
Benzodiazepines: NOT DETECTED
Cocaine: NOT DETECTED
Opiates: NOT DETECTED
Tetrahydrocannabinol: NOT DETECTED

## 2022-12-27 LAB — COMPREHENSIVE METABOLIC PANEL
ALT: 15 U/L (ref 0–44)
AST: 24 U/L (ref 15–41)
Albumin: 3.6 g/dL (ref 3.5–5.0)
Alkaline Phosphatase: 82 U/L (ref 38–126)
Anion gap: 9 (ref 5–15)
BUN: 11 mg/dL (ref 6–20)
CO2: 22 mmol/L (ref 22–32)
Calcium: 9.1 mg/dL (ref 8.9–10.3)
Chloride: 103 mmol/L (ref 98–111)
Creatinine, Ser: 0.79 mg/dL (ref 0.44–1.00)
GFR, Estimated: >60 mL/min (ref >60)
Glucose, Bld: 97 mg/dL (ref 70–99)
Potassium: 3.9 mmol/L (ref 3.5–5.1)
Sodium: 134 mmol/L — ABNORMAL LOW (ref 135–145)
Total Bilirubin: 0.2 mg/dL (ref <1.2)
Total Protein: 6.9 g/dL (ref 6.5–8.1)

## 2022-12-27 LAB — HEPATITIS C ANTIBODY: HCV Ab: NONREACTIVE

## 2022-12-27 LAB — RAPID HIV SCREEN (HIV 1/2 AB+AG)
HIV 1/2 Antibodies: NONREACTIVE
HIV-1 P24 Antigen - HIV24: NONREACTIVE

## 2022-12-27 LAB — HEPATITIS B SURFACE ANTIGEN: Hepatitis B Surface Ag: NONREACTIVE

## 2022-12-27 LAB — RPR: RPR Ser Ql: NONREACTIVE

## 2022-12-27 LAB — PREGNANCY, URINE: Preg Test, Ur: NEGATIVE

## 2022-12-27 MED ORDER — LIDOCAINE HCL (PF) 1 % IJ SOLN
1.0000 mL | Freq: Once | INTRAMUSCULAR | Status: AC
  Administered 2022-12-27: 2 mL

## 2022-12-27 MED ORDER — CEFTRIAXONE SODIUM 500 MG IJ SOLR
500.0000 mg | Freq: Once | INTRAMUSCULAR | Status: AC
Start: 2022-12-27 — End: 2022-12-27
  Administered 2022-12-27: 500 mg via INTRAMUSCULAR

## 2022-12-27 MED ORDER — AZITHROMYCIN 250 MG PO TABS
1000.0000 mg | ORAL_TABLET | Freq: Once | ORAL | Status: AC
Start: 2022-12-27 — End: 2022-12-27
  Administered 2022-12-27: 1000 mg via ORAL

## 2022-12-27 MED ORDER — ELVITEG-COBIC-EMTRICIT-TENOFAF 150-150-200-10 MG PO TABS
1.0000 | ORAL_TABLET | Freq: Every day | ORAL | 0 refills | Status: AC
  Filled 2022-12-27: qty 30, 30d supply, fill #0

## 2022-12-27 MED ORDER — ELVITEG-COBIC-EMTRICIT-TENOFAF 150-150-200-10 MG PREPACK
1.0000 | ORAL_TABLET | Freq: Once | ORAL | Status: AC
Start: 2022-12-27 — End: 2022-12-27
  Administered 2022-12-27: 1 via ORAL
  Filled 2022-12-27: qty 1

## 2022-12-27 MED ORDER — METRONIDAZOLE 500 MG PO TABS
2000.0000 mg | ORAL_TABLET | Freq: Once | ORAL | Status: AC
Start: 2022-12-27 — End: 2022-12-27
  Administered 2022-12-27: 2000 mg via ORAL
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
## 2022-12-27 NOTE — ED Notes (Signed)
AVS and printed prescription provided to pt. Pt currently waiting sane RN for pending medication

## 2022-12-27 NOTE — ED Notes (Signed)
Pt back in the waiting area, states she went to [employer]
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
## 2022-12-27 NOTE — ED Notes (Signed)
Sane RN at bedside.

## 2022-12-27 NOTE — Consult Note (Addendum)
Introduced myself to patient and explained our services. I had to do this several times due to patient's inattention and staring off into space. She interrupted asking if she could just be treated. Discussed medication for STI prophylaxis, emergency contraception, HIV nPEP, (purpose, dose, administration, side effects). Informed that some medications may require labwork prior to administration. Medications for pain or nausea may also be provided at patient's request. Patient declined emergency contraception, but agreed to STI prophylaxis and HIV nPEP. Also spoke to patient about counseling services at [facility] and OB/GYN services at [facility]. Awaiting results before providing HIV nPEP.   1610 Reviewed lab results. Reviewed Genvoya and how to take it (purpose, dose, administration, side effects). Patient verbalized understanding.

## 2022-12-27 NOTE — ED Notes (Signed)
Pt called multiple times for a room by this NT and registration. Pt not found in the lobby, bathroom, or consult room. Charge notified.
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
## 2023-01-05 ENCOUNTER — Other Ambulatory Visit: Payer: Self-pay

## 2023-01-05 ENCOUNTER — Emergency Department (HOSPITAL_COMMUNITY)
Admission: EM | Admit: 2023-01-05 | Discharge: 2023-01-06 | Disposition: A | Discharge status: Home / Self Care | Payer: MEDICAID | Attending: Emergency Medicine | Admitting: Emergency Medicine

## 2023-01-05 DIAGNOSIS — R112 Nausea with vomiting, unspecified: Secondary | ICD-10-CM | POA: Diagnosis present

## 2023-01-05 DIAGNOSIS — Z91040 Latex allergy status: Secondary | ICD-10-CM | POA: Insufficient documentation

## 2023-01-05 DIAGNOSIS — R110 Nausea: Secondary | ICD-10-CM

## 2023-01-05 DIAGNOSIS — R197 Diarrhea, unspecified: Secondary | ICD-10-CM | POA: Diagnosis not present

## 2023-01-05 DIAGNOSIS — N898 Other specified noninflammatory disorders of vagina: Secondary | ICD-10-CM | POA: Diagnosis present

## 2023-01-05 DIAGNOSIS — Z7982 Long term (current) use of aspirin: Secondary | ICD-10-CM | POA: Diagnosis not present

## 2023-01-05 LAB — COMPREHENSIVE METABOLIC PANEL
ALT: 17 U/L (ref 0–44)
AST: 21 U/L (ref 15–41)
Albumin: 4.2 g/dL (ref 3.5–5.0)
Alkaline Phosphatase: 81 U/L (ref 38–126)
Anion gap: 12 (ref 5–15)
BUN: 6 mg/dL (ref 6–20)
CO2: 24 mmol/L (ref 22–32)
Calcium: 9.2 mg/dL (ref 8.9–10.3)
Chloride: 103 mmol/L (ref 98–111)
Creatinine, Ser: 0.55 mg/dL (ref 0.44–1.00)
GFR, Estimated: >60 mL/min (ref >60)
Glucose, Bld: 102 mg/dL — ABNORMAL HIGH (ref 70–99)
Potassium: 3.7 mmol/L (ref 3.5–5.1)
Sodium: 139 mmol/L (ref 135–145)
Total Bilirubin: 0.8 mg/dL (ref <1.2)
Total Protein: 8 g/dL (ref 6.5–8.1)

## 2023-01-05 LAB — CBC
HCT: 44.2 % (ref 36.0–46.0)
Hemoglobin: 13.9 g/dL (ref 12.0–15.0)
MCH: 25.8 pg — ABNORMAL LOW (ref 26.0–34.0)
MCHC: 31.4 g/dL (ref 30.0–36.0)
MCV: 82.2 fL (ref 80.0–100.0)
Platelets: 341 10*3/uL (ref 150–400)
RBC: 5.38 MIL/uL — ABNORMAL HIGH (ref 3.87–5.11)
RDW: 14 % (ref 11.5–15.5)
WBC: 7.3 10*3/uL (ref 4.0–10.5)
nRBC: 0 % (ref 0.0–0.2)

## 2023-01-05 LAB — ETHANOL: Alcohol, Ethyl (B): 49 mg/dL — ABNORMAL HIGH (ref <10)

## 2023-01-05 LAB — HCG, SERUM, QUALITATIVE: Preg, Serum: NEGATIVE

## 2023-01-05 LAB — LIPASE, BLOOD: Lipase: 23 U/L (ref 11–51)

## 2023-01-05 MED ORDER — ONDANSETRON 4 MG PO TBDP
4.0000 mg | ORAL_TABLET | Freq: Once | ORAL | Status: AC | PRN
  Administered 2023-01-05: 4 mg via ORAL
  Filled 2023-01-05: qty 1

## 2023-01-05 NOTE — ED Notes (Signed)
Pt spit out zofran and rinsed out mouth in sink right after administration.

## 2023-01-05 NOTE — ED Triage Notes (Signed)
Pt BIB EMS from home for N/V x 4 hours. States she drank a wine cooler and these symptoms started afterwards.

## 2023-01-05 NOTE — ED Notes (Signed)
Pt called for vitals, no answer 

## 2023-01-05 NOTE — ED Provider Triage Note (Signed)
Emergency Medicine Provider Triage Evaluation Note  Amy Dougherty , a 27 y.o. female  was evaluated in triage.  Pt complains of nausea and vomiting for 4 hours.   She states her symptoms began after drinking a wine cooler.  Patient reports she normally is fine after drinking wine.  Denies abdominal pain.   Review of Systems  Positive: As above Negative: As above  Physical Exam  BP (!) 160/107 (BP Location: Right Arm)   Pulse 94   Temp 98.3 F (36.8 C) (Oral)   Resp 16   SpO2 100%  Gen:   Awake, no distress   Resp:  Normal effort  MSK:   Moves extremities without difficulty  Other:  Patient actively dry heaving and spitting in emesis bag  Medical Decision Making  Medically screening exam initiated at 8:39 PM.  Appropriate orders placed.  Amy Dougherty was informed that the remainder of the evaluation will be completed by another provider, this initial triage assessment does not replace that evaluation, and the importance of remaining in the ED until their evaluation is complete.     Lenard Simmer, [place] 01/05/23 2041

## 2023-01-06 ENCOUNTER — Emergency Department (HOSPITAL_COMMUNITY)
Admission: EM | Admit: 2023-01-06 | Discharge: 2023-01-06 | Disposition: A | Discharge status: Home / Self Care | Payer: MEDICAID | Source: Home / Self Care | Attending: Emergency Medicine | Admitting: Emergency Medicine

## 2023-01-06 ENCOUNTER — Encounter (HOSPITAL_COMMUNITY): Payer: Self-pay

## 2023-01-06 ENCOUNTER — Other Ambulatory Visit: Payer: Self-pay

## 2023-01-06 DIAGNOSIS — Z91040 Latex allergy status: Secondary | ICD-10-CM | POA: Insufficient documentation

## 2023-01-06 DIAGNOSIS — N898 Other specified noninflammatory disorders of vagina: Secondary | ICD-10-CM | POA: Insufficient documentation

## 2023-01-06 DIAGNOSIS — Z7982 Long term (current) use of aspirin: Secondary | ICD-10-CM | POA: Insufficient documentation

## 2023-01-06 LAB — WET PREP, GENITAL
Clue Cells Wet Prep HPF POC: NONE SEEN
Sperm: NONE SEEN
Trich, Wet Prep: NONE SEEN
WBC, Wet Prep HPF POC: <10 (ref <10)
Yeast Wet Prep HPF POC: NONE SEEN

## 2023-01-06 MED ORDER — ONDANSETRON 4 MG PO TBDP: 4.0000 mg | ORAL_TABLET | Freq: Three times a day (TID) | ORAL | 0 refills | Status: AC | PRN

## 2023-01-06 MED ORDER — DOXYCYCLINE HYCLATE 100 MG PO CAPS: 100.0000 mg | ORAL_CAPSULE | Freq: Two times a day (BID) | ORAL | 0 refills | Status: AC

## 2023-01-06 MED ORDER — ONDANSETRON 4 MG PO TBDP: 4.0000 mg | ORAL_TABLET | Freq: Once | ORAL | Status: DC

## 2023-01-06 NOTE — ED Provider Notes (Signed)
Amy Dougherty Provider Note   CSN: [id number] Arrival date & time: 01/06/23  1004     History  Chief Complaint  Patient presents with   Nausea    Amy Dougherty is a 27 y.o. female.  Patient is concerned that she has been exposed to an STD.  Patient's partner told her that he has chlamydia.  Patient reports she was here earlier today and had some nausea.  Patient was able to eat breakfast without vomiting.  Patient denies any fever or chills.  She has not had any cough or congestion.  The history is provided by the patient. No language interpreter was used.       Home Medications Prior to Admission medications   Medication Sig Start Date End Date Taking? Authorizing Provider  doxycycline (VIBRAMYCIN) 100 MG capsule Take 1 capsule (100 mg total) by mouth 2 (two) times daily. 01/06/23  Yes Amy Areas, PA-C  acetaminophen (TYLENOL) 500 MG tablet Take 500 mg by mouth every 6 (six) hours as needed for mild pain or headache.     [provider]  aspirin EC 81 MG tablet Take 81 mg by mouth daily. Swallow whole.    [provider]  cefadroxil (DURICEF) 500 MG capsule Take 1 capsule (500 mg total) by mouth 2 (two) times daily. 03/28/22   Amy Dougherty, CNM  elvitegravir-cobicistat-emtricitabine-tenofovir (GENVOYA) 150-150-200-10 MG TABS tablet Take 1 tablet by mouth daily with breakfast. 12/27/22   Palumbo, April, MD  metoCLOPramide (REGLAN) 10 MG tablet Take 1 tablet (10 mg total) by mouth every 6 (six) hours as needed for nausea or vomiting. 12/12/21   Amy Dougherty, CNM  metroNIDAZOLE (FLAGYL) 500 MG tablet Take 1 tablet (500 mg total) by mouth 2 (two) times daily. 12/12/21   Amy Dougherty, CNM  ondansetron (ZOFRAN-ODT) 4 MG disintegrating tablet Take 1 tablet (4 mg total) by mouth every 6 (six) hours as needed for nausea. 01/22/22   Amy Dougherty, CNM  ondansetron (ZOFRAN-ODT) 4 MG disintegrating tablet Take 1  tablet (4 mg total) by mouth every 8 (eight) hours as needed. 01/06/23   Amy Dougherty, Amy Masker, MD  terconazole (TERAZOL 3) 0.8 % vaginal cream Place 1 applicator vaginally at bedtime. 03/28/22   Amy Dougherty, CNM  cetirizine (ZYRTEC) 10 MG tablet Take 10 mg by mouth daily as needed. For cold symptoms  04/25/11  [provider]      Allergies    Cherry, Ibuprofen, Latex, Norethindrone, and Other    Review of Systems   Review of Systems  All other systems reviewed and are negative.   Physical Exam Updated Vital Dougherty BP (!) 151/89 (BP Location: Right Arm)   Pulse 91   Temp 98 F (36.7 C) (Oral)   Resp 16   SpO2 97%  Physical Exam Vitals and nursing note reviewed.  Constitutional:      Appearance: She is well-developed.  HENT:     Head: Normocephalic.  Cardiovascular:     Rate and Rhythm: Normal rate.  Pulmonary:     Effort: Pulmonary effort is normal.  Abdominal:     General: There is no distension.  Musculoskeletal:        General: Normal range of motion.     Cervical back: Normal range of motion.  Skin:    General: Skin is warm.  Neurological:     General: No focal deficit present.     Mental Status: She is alert and oriented to  person, place, and time.  Psychiatric:        Mood and Affect: Mood normal.     ED Results / Procedures / Treatments   Labs (all labs ordered are listed, but only abnormal results are displayed) Labs Reviewed  WET PREP, GENITAL  GC/CHLAMYDIA PROBE AMP ([facility]) NOT AT [facility]    EKG None  Radiology No results found.  Procedures Procedures    Medications Ordered in ED Medications - No data to display  ED Course/ Medical Decision Making/ A&P                                 Medical Decision Making Patient complains of exposure to chlamydia.  Patient is requesting doxycycline for treatment.  Amount and/or Complexity of Data Reviewed Labs: ordered.    Details: Wet prep ordered reviewed and interpreted wet prep is  negative for GC chlamydia are pending  Risk Prescription drug management. Risk Details: Patient is given a prescription for doxycycline.  She is advised to follow-up with her primary care physician for recheck.           Final Clinical Impression(s) / ED Diagnoses Final diagnoses:  Vaginal discharge    Rx / DC Orders ED Discharge Orders          Ordered    doxycycline (VIBRAMYCIN) 100 MG capsule  2 times daily        01/06/23 1109           An After Visit Summary was printed and given to the patient.    Amy Dougherty, [place] 01/06/23 1113    [name], MD 01/06/23 [phone]

## 2023-01-06 NOTE — ED Notes (Signed)
Pt provided with sandwich and water.

## 2023-01-06 NOTE — Discharge Instructions (Addendum)
Take your antibiotic as directed

## 2023-01-06 NOTE — ED Notes (Signed)
Pt called to be roomed 3 times, no answer.

## 2023-01-06 NOTE — Discharge Instructions (Signed)
You were seen today for nausea.  Your workup is reassuring.  Take Zofran as needed for ongoing nausea.  Make sure that you are staying hydrated at home.

## 2023-01-06 NOTE — ED Notes (Signed)
Pt provided ginger ale and crackers. Pt able to tolerate and denies nausea and vomiting

## 2023-01-06 NOTE — ED Triage Notes (Addendum)
Pt returned after being discharged stating she tried eating breakfast this morning and still feels sick. Pt states she cannot be seen by a doctor for 6 weeks and she wants [place] to put her on bed rest till then.
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
## 2023-01-06 NOTE — ED Provider Notes (Signed)
[place] AT [facility] Provider Note   CSN: [id number] Arrival date & time: 01/05/23  2031     History  Chief Complaint  Patient presents with   Nausea    Amy Dougherty is a 27 y.o. female.  HPI     This is a 27 year old female who presents with nausea and vomiting.  Patient reports onset of symptoms 4 hours prior to arrival.  Has had some diarrhea.  She also drank a few wine coolers before hand.  States that she normally can handle drinking these wine coolers without vomiting.  States that she felt like she might pass out and "I did not know if I was going to die."  Denies chest pain or shortness of breath.  No known sick contacts.  Does not believe herself to be pregnant.  Denies abdominal pain.  Home Medications Prior to Admission medications   Medication Sig Start Date End Date Taking? Authorizing Provider  ondansetron (ZOFRAN-ODT) 4 MG disintegrating tablet Take 1 tablet (4 mg total) by mouth every 8 (eight) hours as needed. 01/06/23  Yes Auset Fritzler, Mayer Masker, MD  acetaminophen (TYLENOL) 500 MG tablet Take 500 mg by mouth every 6 (six) hours as needed for mild pain or headache.     [provider]  aspirin EC 81 MG tablet Take 81 mg by mouth daily. Swallow whole.    [provider]  cefadroxil (DURICEF) 500 MG capsule Take 1 capsule (500 mg total) by mouth 2 (two) times daily. 03/28/22   Gerrit Heck, CNM  elvitegravir-cobicistat-emtricitabine-tenofovir (GENVOYA) 150-150-200-10 MG TABS tablet Take 1 tablet by mouth daily with breakfast. 12/27/22   Palumbo, April, MD  metoCLOPramide (REGLAN) 10 MG tablet Take 1 tablet (10 mg total) by mouth every 6 (six) hours as needed for nausea or vomiting. 12/12/21   Donette Larry, CNM  metroNIDAZOLE (FLAGYL) 500 MG tablet Take 1 tablet (500 mg total) by mouth 2 (two) times daily. 12/12/21   Donette Larry, CNM  ondansetron (ZOFRAN-ODT) 4 MG disintegrating tablet Take 1 tablet (4 mg  total) by mouth every 6 (six) hours as needed for nausea. 01/22/22   Aviva Signs, CNM  terconazole (TERAZOL 3) 0.8 % vaginal cream Place 1 applicator vaginally at bedtime. 03/28/22   Gerrit Heck, CNM  cetirizine (ZYRTEC) 10 MG tablet Take 10 mg by mouth daily as needed. For cold symptoms  04/25/11  [provider]      Allergies    Cherry, Ibuprofen, Latex, Norethindrone, and Other    Review of Systems   Review of Systems  Constitutional:  Negative for fever.  Respiratory:  Negative for shortness of breath.   Cardiovascular:  Negative for chest pain.  Gastrointestinal:  Positive for diarrhea, nausea and vomiting. Negative for abdominal pain.  All other systems reviewed and are negative.   Physical Exam Updated Vital Signs BP (!) 169/84 (BP Location: Right Arm)   Pulse (!) 108   Temp 98.1 F (36.7 C)   Resp 16   SpO2 100%  Physical Exam Vitals and nursing note reviewed.  Constitutional:      Appearance: She is well-developed. She is obese. She is not ill-appearing.  HENT:     Head: Normocephalic and atraumatic.  Eyes:     Pupils: Pupils are equal, round, and reactive to light.  Cardiovascular:     Rate and Rhythm: Normal rate and regular rhythm.     Heart sounds: Normal heart sounds.  Pulmonary:  Effort: Pulmonary effort is normal. No respiratory distress.     Breath sounds: No wheezing.  Abdominal:     General: Bowel sounds are normal.     Palpations: Abdomen is soft.     Tenderness: There is no abdominal tenderness. There is no guarding or rebound.  Musculoskeletal:     Cervical back: Neck supple.  Skin:    General: Skin is warm and dry.  Neurological:     Mental Status: She is alert and oriented to person, place, and time.  Psychiatric:        Mood and Affect: Mood normal.     ED Results / Procedures / Treatments   Labs (all labs ordered are listed, but only abnormal results are displayed) Labs Reviewed  COMPREHENSIVE METABOLIC PANEL -  Abnormal; Notable for the following components:      Result Value   Glucose, Bld 102 (*)    All other components within normal limits  CBC - Abnormal; Notable for the following components:   RBC 5.38 (*)    MCH 25.8 (*)    All other components within normal limits  ETHANOL - Abnormal; Notable for the following components:   Alcohol, Ethyl (B) 49 (*)    All other components within normal limits  LIPASE, BLOOD  HCG, SERUM, QUALITATIVE  URINALYSIS, ROUTINE W REFLEX MICROSCOPIC    EKG None  Radiology No results found.  Procedures Procedures    Medications Ordered in ED Medications  ondansetron (ZOFRAN-ODT) disintegrating tablet 4 mg (has no administration in time range)  ondansetron (ZOFRAN-ODT) disintegrating tablet 4 mg (4 mg Oral Given 01/05/23 2047)    ED Course/ Medical Decision Making/ A&P                                 Medical Decision Making Amount and/or Complexity of Data Reviewed Labs: ordered.  Risk Prescription drug management.   This patient presents to the ED for concern of nausea, this involves an extensive number of treatment options, and is a complaint that carries with it a high risk of complications and morbidity.  I considered the following differential and admission for this acute, potentially life threatening condition.  The differential diagnosis includes gastritis, gastroenteritis, less likely SBO  MDM:    This is a 27 year old female who presents with nausea.  Reports that she had vomiting at home.  Has had some diarrhea but also reports symptoms mostly started after drinking some wine coolers.  She is nontoxic.  Vital signs are reassuring.  She clinically appears hydrated.  Abdominal exam is benign.  Labs obtained and reviewed.  No significant metabolic derangements.  LFTs and white count are normal.  She is not pregnant.  Blood alcohol level 49.  Patient was offered Zofran but declined.  She slowly was able to tolerate fluids without recurrent  emesis.  (Labs, imaging, consults)  Labs: I Ordered, and personally interpreted labs.  The pertinent results include: CBC, CMP, hCG, ethanol  Imaging Studies ordered: I ordered imaging studies including none I independently visualized and interpreted imaging. I agree with the radiologist interpretation  Additional history obtained from chart review.  External records from outside source obtained and reviewed including prior evaluations  Cardiac Monitoring: The patient was maintained on a cardiac monitor.  If on the cardiac monitor, I personally viewed and interpreted the cardiac monitored which showed an underlying rhythm of: Sinus  Reevaluation: After the interventions noted above, I  reevaluated the patient and found that they have :improved  Social Determinants of Health:  lives independently  Disposition: Discharge  Co morbidities that complicate the patient evaluation  Past Medical History:  Diagnosis Date   Herpes genitalis in women    currently taking medications   History of blood clots 2012   ovarian vein thrombosis, postpartum   Wisdom teeth extracted      Medicines Meds ordered this encounter  Medications   ondansetron (ZOFRAN-ODT) disintegrating tablet 4 mg   ondansetron (ZOFRAN-ODT) disintegrating tablet 4 mg   ondansetron (ZOFRAN-ODT) 4 MG disintegrating tablet    Sig: Take 1 tablet (4 mg total) by mouth every 8 (eight) hours as needed.    Dispense:  20 tablet    Refill:  0    I have reviewed the patients home medicines and have made adjustments as needed  Problem List / ED Course: Problem List Items Addressed This Visit   None Visit Diagnoses     Nausea    -  Primary                   Final Clinical Impression(s) / ED Diagnoses Final diagnoses:  Nausea    Rx / DC Orders ED Discharge Orders          Ordered    ondansetron (ZOFRAN-ODT) 4 MG disintegrating tablet  Every 8 hours PRN        01/06/23 0344               Shon Baton, MD 01/06/23 [phone]

## 2023-01-06 NOTE — ED Notes (Signed)
Pt given bus pass and ginger ale upon d.c

## 2023-01-08 LAB — GC/CHLAMYDIA PROBE AMP (~~LOC~~) NOT AT ARMC
Chlamydia: NEGATIVE
Comment: NEGATIVE
Comment: NORMAL
Neisseria Gonorrhea: NEGATIVE
